# Patient Record
Sex: Male | Born: 1995 | Race: Black or African American | Hispanic: No | Marital: Single | Smoking: Former smoker
Health system: Southern US, Community
[De-identification: ages and names within clinical notes are randomized; demographics above are authoritative.]

## PROBLEM LIST (undated history)

## (undated) DIAGNOSIS — Z5111 Encounter for antineoplastic chemotherapy: Secondary | ICD-10-CM

## (undated) DIAGNOSIS — C8110 Nodular sclerosis classical Hodgkin lymphoma, unspecified site: Secondary | ICD-10-CM

## (undated) DIAGNOSIS — K219 Gastro-esophageal reflux disease without esophagitis: Secondary | ICD-10-CM

## (undated) DIAGNOSIS — J189 Pneumonia, unspecified organism: Secondary | ICD-10-CM

## (undated) DIAGNOSIS — R519 Headache, unspecified: Secondary | ICD-10-CM

## (undated) DIAGNOSIS — C8112 Nodular sclerosis classical Hodgkin lymphoma, intrathoracic lymph nodes: Secondary | ICD-10-CM

## (undated) DIAGNOSIS — D701 Agranulocytosis secondary to cancer chemotherapy: Principal | ICD-10-CM

## (undated) DIAGNOSIS — F909 Attention-deficit hyperactivity disorder, unspecified type: Secondary | ICD-10-CM

## (undated) DIAGNOSIS — D649 Anemia, unspecified: Secondary | ICD-10-CM

## (undated) DIAGNOSIS — R51 Headache: Secondary | ICD-10-CM

## (undated) DIAGNOSIS — T451X5A Adverse effect of antineoplastic and immunosuppressive drugs, initial encounter: Principal | ICD-10-CM

## (undated) HISTORY — DX: Pneumonia, unspecified organism: J18.9

## (undated) HISTORY — DX: Nodular sclerosis Hodgkin lymphoma, intrathoracic lymph nodes: C81.12

## (undated) HISTORY — DX: Encounter for antineoplastic chemotherapy: Z51.11

## (undated) HISTORY — DX: Agranulocytosis secondary to cancer chemotherapy: D70.1

## (undated) HISTORY — DX: Adverse effect of antineoplastic and immunosuppressive drugs, initial encounter: T45.1X5A

---
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
## 2013-10-30 ENCOUNTER — Emergency Department (INDEPENDENT_AMBULATORY_CARE_PROVIDER_SITE_OTHER)
Admission: EM | Admit: 2013-10-30 | Discharge: 2013-10-30 | Disposition: A | Discharge status: Home / Self Care | Payer: Medicaid Other | Source: Home / Self Care | Attending: Family Medicine | Admitting: Family Medicine

## 2013-10-30 ENCOUNTER — Encounter (HOSPITAL_COMMUNITY): Payer: Self-pay | Admitting: Emergency Medicine

## 2013-10-30 DIAGNOSIS — R05 Cough: Secondary | ICD-10-CM

## 2013-10-30 DIAGNOSIS — R0981 Nasal congestion: Secondary | ICD-10-CM

## 2013-10-30 DIAGNOSIS — R059 Cough, unspecified: Secondary | ICD-10-CM

## 2013-10-30 HISTORY — DX: Attention-deficit hyperactivity disorder, unspecified type: F90.9

## 2013-10-30 MED ORDER — IPRATROPIUM BROMIDE 0.06 % NA SOLN
2.0000 | Freq: Four times a day (QID) | NASAL | Status: DC
Start: 2013-10-30 — End: 2013-12-08

## 2013-10-30 MED ORDER — BENZONATATE 100 MG PO CAPS: 100.0000 mg | ORAL_CAPSULE | Freq: Three times a day (TID) | ORAL | Status: DC | PRN

## 2013-10-30 NOTE — Discharge Instructions (Signed)
Cough, Adult  A cough is a reflex that helps clear your throat and airways. It can help heal the body or may be a reaction to an irritated airway. A cough may only last 2 or 3 weeks (acute) or may last more than 8 weeks (chronic).  CAUSES Acute cough:  Viral or bacterial infections. Chronic cough:  Infections.  Allergies.  Asthma.  Post-nasal drip.  Smoking.  Heartburn or acid reflux.  Some medicines.  Chronic lung problems (COPD).  Cancer. SYMPTOMS   Cough.  Fever.  Chest pain.  Increased breathing rate.  High-pitched whistling sound when breathing (wheezing).  Colored mucus that you cough up (sputum). TREATMENT   A bacterial cough may be treated with antibiotic medicine.  A viral cough must run its course and will not respond to antibiotics.  Your caregiver may recommend other treatments if you have a chronic cough. HOME CARE INSTRUCTIONS   Only take over-the-counter or prescription medicines for pain, discomfort, or fever as directed by your caregiver. Use cough suppressants only as directed by your caregiver.  Use a cold steam vaporizer or humidifier in your bedroom or home to help loosen secretions.  Sleep in a semi-upright position if your cough is worse at night.  Rest as needed.  Stop smoking if you smoke. SEEK IMMEDIATE MEDICAL CARE IF:   You have pus in your sputum.  Your cough starts to worsen.  You cannot control your cough with suppressants and are losing sleep.  You begin coughing up blood.  You have difficulty breathing.  You develop pain which is getting worse or is uncontrolled with medicine.  You have a fever. MAKE SURE YOU:   Understand these instructions.  Will watch your condition.  Will get help right away if you are not doing well or get worse. Document Released: 06/17/2010 Document Revised: 03/13/2011 Document Reviewed: 06/17/2010 Exit[employer] Patient Information 2015 [employer]. This information is not intended  to replace advice given to you by your health care provider. Make sure you discuss any questions you have with your health care provider.  

## 2013-10-30 NOTE — ED Notes (Signed)
C/o productive cough with green sputum.  Denies chest pain and sob.  Also c/o not being able to sleep.  Denies fever.  No relief with tylenol symptoms present x 2 wks.

## 2013-10-30 NOTE — ED Provider Notes (Signed)
CSN: [id number]     Arrival date & time 10/30/13  1537 History   First MD Initiated Contact with Patient 10/30/13 1623     Chief Complaint  Patient presents with  . Cough  . Insomnia   (Consider location/radiation/quality/duration/timing/severity/associated sxs/prior Treatment) HPI Comments: PCP: [facility] [place] [facility]  Patient is a 18 y.o. male presenting with cough. The history is provided by the patient.  Cough Cough characteristics: occasionally productive. Severity:  Mild Onset quality:  Gradual Duration: several days. Timing:  Intermittent Progression:  Waxing and waning (seems to be worse at night) Chronicity:  New Smoker: no   Ineffective treatments: tylenol and Goody's  Associated symptoms: headaches and rhinorrhea   Associated symptoms: no chest pain, no chills, no diaphoresis, no ear pain, no fever, no myalgias, no rash, no shortness of breath, no sinus congestion, no sore throat, no weight loss and no wheezing   Associated symptoms comment:  +nasal congestion   Past Medical History  Diagnosis Date  . Adult ADHD    History reviewed. No pertinent past surgical history. History reviewed. No pertinent family history. History  Substance Use Topics  . Smoking status: Never Smoker   . Smokeless tobacco: Not on file  . Alcohol Use: No    Review of Systems  Constitutional: Negative for fever, chills, weight loss and diaphoresis.  HENT: Positive for rhinorrhea. Negative for ear pain and sore throat.   Respiratory: Positive for cough. Negative for shortness of breath and wheezing.   Cardiovascular: Negative for chest pain.  Gastrointestinal: Negative.   Genitourinary: Negative.   Musculoskeletal: Negative for myalgias.  Skin: Negative for rash.  Neurological: Positive for headaches.    Allergies  Bactrim  Home Medications   Prior to Admission medications   Medication Sig Start Date End Date Taking? Authorizing Provider  fluticasone  (FLONASE) 50 MCG/ACT nasal spray Place 2 sprays into the nose daily. 12/21/10 12/21/11  Earlie Counts, MD  ibuprofen (ADVIL,MOTRIN) 600 MG tablet Take 1 tablet (600 mg total) by mouth every 6 (six) hours as needed for pain. 05/22/12   Rosana Hoes, MD   BP 131/91  Pulse 82  Temp(Src) 99.2 F (37.3 C) (Oral)  Resp 14  SpO2 98% Physical Exam  Nursing note and vitals reviewed. Constitutional: He is oriented to person, place, and time. He appears well-developed and well-nourished. No distress.  HENT:  Head: Normocephalic and atraumatic.  Right Ear: Hearing, tympanic membrane, external ear and ear canal normal.  Left Ear: Hearing, tympanic membrane, external ear and ear canal normal.  Nose: Mucosal edema and rhinorrhea present.  Mouth/Throat: Uvula is midline, oropharynx is clear and moist and mucous membranes are normal. No oral lesions. No trismus in the jaw. Normal dentition. No uvula swelling.  +small amount of clear fluid behind both TMs  Eyes: Conjunctivae are normal. No scleral icterus.  Cardiovascular: Normal rate, regular rhythm and normal heart sounds.   Pulmonary/Chest: Effort normal and breath sounds normal. No stridor. No respiratory distress. He has no wheezes.  Musculoskeletal: Normal range of motion.  Neurological: He is alert and oriented to person, place, and time.  Skin: Skin is warm and dry.  Psychiatric: He has a normal mood and affect. His behavior is normal.    ED Course  Procedures (including critical care time) Labs Review Labs Reviewed - No data to display  Imaging Review No results found.   MDM   1. Cough   2. Nasal congestion   Atrovent for nasal congestion  Tessalon for cough Expect resolution over next few days If no improvement, advised PCP follow up.     Lutricia Feil, [place] 10/30/13 [phone]

## 2013-11-15 ENCOUNTER — Encounter (HOSPITAL_COMMUNITY): Payer: Self-pay | Admitting: Emergency Medicine

## 2013-11-15 ENCOUNTER — Emergency Department (INDEPENDENT_AMBULATORY_CARE_PROVIDER_SITE_OTHER)
Admission: EM | Admit: 2013-11-15 | Discharge: 2013-11-15 | Disposition: A | Discharge status: Home / Self Care | Payer: Medicaid Other | Source: Home / Self Care | Attending: Emergency Medicine | Admitting: Emergency Medicine

## 2013-11-15 ENCOUNTER — Other Ambulatory Visit (HOSPITAL_COMMUNITY)
Admission: RE | Admit: 2013-11-15 | Discharge: 2013-11-15 | Disposition: A | Discharge status: Home / Self Care | Payer: Medicaid Other | Source: Ambulatory Visit | Attending: Emergency Medicine | Admitting: Emergency Medicine

## 2013-11-15 DIAGNOSIS — Z202 Contact with and (suspected) exposure to infections with a predominantly sexual mode of transmission: Secondary | ICD-10-CM

## 2013-11-15 DIAGNOSIS — Z113 Encounter for screening for infections with a predominantly sexual mode of transmission: Secondary | ICD-10-CM | POA: Diagnosis present

## 2013-11-15 LAB — RPR

## 2013-11-15 LAB — HIV ANTIBODY (ROUTINE TESTING W REFLEX): HIV: NONREACTIVE

## 2013-11-15 MED ORDER — AZITHROMYCIN 250 MG PO TABS
1000.0000 mg | ORAL_TABLET | Freq: Once | ORAL | Status: AC
  Administered 2013-11-15: 1000 mg via ORAL

## 2013-11-15 MED ORDER — AZITHROMYCIN 250 MG PO TABS
ORAL_TABLET | ORAL | Status: AC
  Filled 2013-11-15: qty 4

## 2013-11-15 MED ORDER — LIDOCAINE HCL (PF) 1 % IJ SOLN
INTRAMUSCULAR | Status: AC
  Filled 2013-11-15: qty 5

## 2013-11-15 MED ORDER — CEFTRIAXONE SODIUM 250 MG IJ SOLR
INTRAMUSCULAR | Status: AC
  Filled 2013-11-15: qty 250

## 2013-11-15 MED ORDER — CEFTRIAXONE SODIUM 250 MG IJ SOLR
250.0000 mg | Freq: Once | INTRAMUSCULAR | Status: AC
  Administered 2013-11-15: 250 mg via INTRAMUSCULAR

## 2013-11-15 NOTE — ED Notes (Signed)
Concerned for std, denies any symptoms.  "just like to be safe"

## 2013-11-15 NOTE — ED Notes (Signed)
Patient has been offered liquids, given ice and sprite.  Patient aware of post injection delay prior to being discharged

## 2013-11-15 NOTE — ED Provider Notes (Signed)
  Chief Complaint   No chief complaint on file.   History of Present Illness   Jeffery Crane is a 18 year old male who had unprotected intercourse last night and is concerned about the possibility of STDs. He himself is asymptomatic. He's had no urethral discharge, burning with urination, or penile pain. He denies any penile lesions, adenopathy, or testicular pain. He has had no abdominal pain, nausea, vomiting, fever, chills, joint pain, or skin rash. His partner has had no symptoms as far as he knows. He has no prior history of STDs.  Review of Systems   Other than as noted above, the patient denies any of the following symptoms: Systemic:  No fevers chills, arthralgias, or adenopathy. GI:  No abdominal pain, nausea or vomiting. GU:  No dysuria, penile pain, discharge, itching, dysuria, genital lesions, testicular pain or swelling. Skin:  No rash or itching.  [place]   Past medical history, family history, social history, meds, and allergies were reviewed.   Physical Examination    Vital signs:  BP 115/80 mmHg  Pulse 85  Temp(Src) 98.7 F (37.1 C) (Oral)  Resp 20  SpO2 100% Gen:  Alert, oriented, in no distress. ENT:  Pharynx clear, no oral lesions.  Abdomen:  Soft and flat, non-distended, and non-tender.  No organomegaly or mass. Genital:  Normal examination. No urethral discharge, no penile lesions, no testicular pain, swelling, or mass, no inguinal adenopathy or hernia. Skin:  Warm and dry.  No rash.   Labs   Results for orders placed or performed during the hospital encounter of 12/21/10  POCT urinalysis dip (device)  Result Value Ref Range   Glucose, UA NEGATIVE NEGATIVE mg/dL   Bilirubin Urine SMALL (A) NEGATIVE   Ketones, ur NEGATIVE NEGATIVE mg/dL   Specific Gravity, Urine 1.025 1.005 - 1.030   Hgb urine dipstick NEGATIVE NEGATIVE   pH 6.5 5.0 - 8.0   Protein, ur 30 (A) NEGATIVE mg/dL   Urobilinogen, UA 2.0 (H) 0.0 - 1.0 mg/dL   Nitrite NEGATIVE NEGATIVE   Leukocytes, UA NEGATIVE NEGATIVE    DNA probes for gonorrhea, chlamydia, and serologies for HIV and syphilis were obtained.  Course in Urgent [place]   The following medications were given:  Medications  cefTRIAXone (ROCEPHIN) injection 250 mg   azithromycin (ZITHROMAX) tablet 1,000 mg    Assessment   The encounter diagnosis was Potential exposure to STD.  Plan    1.  Meds:  The following meds were prescribed:   New Prescriptions   No medications on file    2.  Patient Education/Counseling:  The patient was given appropriate handouts, self care instructions, and instructed in symptomatic relief.The patient was instructed to inform all sexual contacts, avoid intercourse completely for 2 weeks and then only with a condom.  The patient was told that we would call about all abnormal lab results, and that we would need to report certain kinds of infection to the health department.    3.  Follow up:  The patient was told to follow up here if no better in 3 to 4 days, or sooner if becoming worse in any way, and given some red flag symptoms such as fever, pain, or difficulty urinating which would prompt immediate return.  Suggested repeat HIV and syphilis testing in 3 and 6 months at the health department.     Harden Mo, MD 11/15/13 1420

## 2013-11-15 NOTE — Discharge Instructions (Signed)
You have been diagnosed with a possible STD.  Your results should be back in  1 - 3 days.  We will call you with the results of any positive tests, so if you don't hear from [place], you can assume your results are all negative.  If you wish, you can call us here at [phone] and ask for a nurse to give you the results.  They can give you the results of all your tests over the phone.  If your HIV should come back positive, we must give you this result in person to protect your confidentiality.  We can give you a negative HIV result over the phone.    In the meantime, you should avoid intercourse altogether for 1 week.  After that, you should always use condoms--100% of the time.  This will not only prevent pregnancy, but has been shown to prevent HIV, syphilis, gonorrhea, chlamydia, hepatis C and other STDs.  If your test comes back positive, we are required by law to report it to the Health Department.  We also suggest you inform your partner or partners so they can get tested and treated as well.  You can get STD testing for free at the [facility] Department.  It is recommended that you have repeat testing for HIV and syphilis in 3 and 6 months, since it can take a while for these tests to become positive.

## 2013-11-16 NOTE — Progress Notes (Signed)
Quick Note:  Test result was normal. No further action is needed at this time. ______ 

## 2013-11-17 LAB — URINE CYTOLOGY ANCILLARY ONLY
CHLAMYDIA, DNA PROBE: NEGATIVE
NEISSERIA GONORRHEA: NEGATIVE
Trichomonas: NEGATIVE

## 2013-12-08 ENCOUNTER — Emergency Department (INDEPENDENT_AMBULATORY_CARE_PROVIDER_SITE_OTHER)
Admission: EM | Admit: 2013-12-08 | Discharge: 2013-12-08 | Disposition: A | Discharge status: Home / Self Care | Payer: Medicaid Other | Source: Home / Self Care | Attending: Family Medicine | Admitting: Family Medicine

## 2013-12-08 ENCOUNTER — Encounter (HOSPITAL_COMMUNITY): Payer: Self-pay | Admitting: Emergency Medicine

## 2013-12-08 DIAGNOSIS — R0789 Other chest pain: Secondary | ICD-10-CM

## 2013-12-08 DIAGNOSIS — I10 Essential (primary) hypertension: Secondary | ICD-10-CM

## 2013-12-08 NOTE — ED Notes (Signed)
Pt states he has been having chest pain over the last three months that only occurs when he drinks alcohol.  His last episode was about a month ago, when he stopped drinking.  Pt states the pain will be a throbbing in his right chest that radiates into his arm with numbness and tingling.  Pt states that the pain will be a 10/10 and would last about 2 hours.

## 2013-12-08 NOTE — ED Provider Notes (Addendum)
CSN: [id number]     Arrival date & time 12/08/13  1525 History   First MD Initiated Contact with Patient 12/08/13 1545     No chief complaint on file.  (Consider location/radiation/quality/duration/timing/severity/associated sxs/prior Treatment) HPI  Chest pain: ongonig for 3 months. Comes on w/ ETOH ingestion (even one sip). Takes a coupld minutes to start. Lasts for 2 hours. Throbbing sensation in mid-R chest area. Radiation to R arm. Unable to get the pain to stop. Does not worsen w/ exertion. Denies caffeine use. Denies syncope, nausea, palpitations, diaphoresis.   Occasional slight tightness w/ deep inspiration - not pain.    Past Medical History  Diagnosis Date  . Adult ADHD    History reviewed. No pertinent past surgical history. History reviewed. No pertinent family history. History  Substance Use Topics  . Smoking status: Never Smoker   . Smokeless tobacco: Not on file  . Alcohol Use: Yes     Comment: occasional    Review of Systems Per HPI with all other pertinent systems negative.   Allergies  Bactrim  Home Medications   Prior to Admission medications   Medication Sig Start Date End Date Taking? Authorizing Provider  fluticasone (FLONASE) 50 MCG/ACT nasal spray Place 2 sprays into the nose daily. 12/21/10 12/21/11  Earlie Counts, MD   BP 130/103 mmHg  Pulse 88  Temp(Src) 98.1 F (36.7 C) (Oral)  Resp 16  SpO2 97% Physical Exam  Constitutional: He is oriented to person, place, and time. He appears well-developed and well-nourished. No distress.  HENT:  Head: Normocephalic and atraumatic.  Eyes: Pupils are equal, round, and reactive to light.  Neck: Normal range of motion. Neck supple.  Cardiovascular: Normal rate and normal heart sounds.   No murmur heard. Pulmonary/Chest: Effort normal. No respiratory distress.  Abdominal: Soft. Bowel sounds are normal.  Musculoskeletal: Normal range of motion. He exhibits no tenderness.  Neurological: He is alert and  oriented to person, place, and time.  Skin: Skin is warm and dry. No rash noted. He is not diaphoretic. No erythema.  Psychiatric: He has a normal mood and affect. His behavior is normal. Thought content normal.    ED Course  Procedures (including critical care time) Labs Review Labs Reviewed - No data to display  Imaging Review No results found.   EKG NSR, no sign of ischemia. R axis deviation  MDM   1. Atypical chest pain   2. Essential hypertension    HTN: monitor at home and if remains elevated, make appt w/ PCP to discuss starting medications. Goal BP given. Repeat pressure 130/70.   CP: Atypical. - non-cardiac. ??/ gallbladder but not likely. Counseled pt to stop drinking as underage PPI  Seek emergency evaluation if comes on again w/ ETOH use or at other time.   Precautions given and all questions answered  Linna Darner, MD Family Medicine 12/08/2013, 4:47 PM       Waldemar Dickens, MD 12/08/13 [place], MD 12/08/13 [phone]

## 2013-12-08 NOTE — ED Notes (Signed)
RN, notified of BP.

## 2013-12-08 NOTE — Discharge Instructions (Signed)
The cause of your chest pain is not clear other than the fact that it is related to alcohol consumption.  Please avoid alcohol for the time being Your heart exam today was normal There is no immediate sign of a heart attack Please consider starting on prilosec once a day for 2-4 weeks to see if it stops your symptoms If you drink and the pain starts, please go directly to the emergency room.  Please check your blood pressure regularly and if it stays high (higher than 139/89)then go to your regular doctor for blood pressure medication.

## 2013-12-18 ENCOUNTER — Emergency Department: Payer: Self-pay | Admitting: Emergency Medicine

## 2013-12-18 LAB — CBC
HCT: 45.9 % (ref 40.0–52.0)
HGB: 15.1 g/dL (ref 13.0–18.0)
MCH: 29.5 pg (ref 26.0–34.0)
MCHC: 32.9 g/dL (ref 32.0–36.0)
MCV: 90 fL (ref 80–100)
Platelet: 312 10*3/uL (ref 150–440)
RBC: 5.12 10*6/uL (ref 4.40–5.90)
RDW: 13.2 % (ref 11.5–14.5)
WBC: 8.1 10*3/uL (ref 3.8–10.6)

## 2013-12-18 LAB — BASIC METABOLIC PANEL
ANION GAP: 5 — AB (ref 7–16)
BUN: 7 mg/dL (ref 7–18)
CHLORIDE: 105 mmol/L (ref 98–107)
CREATININE: 0.93 mg/dL (ref 0.60–1.30)
Calcium, Total: 9 mg/dL (ref 8.5–10.1)
Co2: 28 mmol/L (ref 21–32)
EGFR (African American): >60
Glucose: 120 mg/dL — ABNORMAL HIGH (ref 65–99)
OSMOLALITY: 275 (ref 275–301)
Potassium: 4 mmol/L (ref 3.5–5.1)
Sodium: 138 mmol/L (ref 136–145)

## 2013-12-18 LAB — TROPONIN I

## 2014-07-11 ENCOUNTER — Encounter (HOSPITAL_COMMUNITY): Payer: Self-pay

## 2014-07-11 ENCOUNTER — Emergency Department (HOSPITAL_COMMUNITY): Payer: Medicaid Other

## 2014-07-11 ENCOUNTER — Emergency Department (HOSPITAL_COMMUNITY)
Admission: EM | Admit: 2014-07-11 | Discharge: 2014-07-11 | Disposition: A | Discharge status: Home / Self Care | Payer: Medicaid Other | Attending: Emergency Medicine | Admitting: Emergency Medicine

## 2014-07-11 DIAGNOSIS — M940 Chondrocostal junction syndrome [Tietze]: Secondary | ICD-10-CM | POA: Insufficient documentation

## 2014-07-11 DIAGNOSIS — R12 Heartburn: Secondary | ICD-10-CM | POA: Insufficient documentation

## 2014-07-11 DIAGNOSIS — Z7951 Long term (current) use of inhaled steroids: Secondary | ICD-10-CM | POA: Diagnosis not present

## 2014-07-11 DIAGNOSIS — R079 Chest pain, unspecified: Secondary | ICD-10-CM | POA: Diagnosis present

## 2014-07-11 DIAGNOSIS — Z8659 Personal history of other mental and behavioral disorders: Secondary | ICD-10-CM | POA: Insufficient documentation

## 2014-07-11 LAB — BASIC METABOLIC PANEL
Anion gap: 9 (ref 5–15)
BUN: 11 mg/dL (ref 6–20)
CALCIUM: 9.1 mg/dL (ref 8.9–10.3)
CO2: 26 mmol/L (ref 22–32)
Chloride: 103 mmol/L (ref 101–111)
Creatinine, Ser: 0.83 mg/dL (ref 0.61–1.24)
GFR calc Af Amer: >60 mL/min (ref >60)
GFR calc non Af Amer: >60 mL/min (ref >60)
Glucose, Bld: 95 mg/dL (ref 65–99)
POTASSIUM: 3.8 mmol/L (ref 3.5–5.1)
Sodium: 138 mmol/L (ref 135–145)

## 2014-07-11 LAB — CBC
HCT: 41.3 % (ref 39.0–52.0)
Hemoglobin: 14.2 g/dL (ref 13.0–17.0)
MCH: 29 pg (ref 26.0–34.0)
MCHC: 34.4 g/dL (ref 30.0–36.0)
MCV: 84.3 fL (ref 78.0–100.0)
Platelets: 266 10*3/uL (ref 150–400)
RBC: 4.9 MIL/uL (ref 4.22–5.81)
RDW: 13.2 % (ref 11.5–15.5)
WBC: 9.8 10*3/uL (ref 4.0–10.5)

## 2014-07-11 LAB — LIPASE, BLOOD: LIPASE: 20 U/L — AB (ref 22–51)

## 2014-07-11 LAB — I-STAT TROPONIN, ED: Troponin i, poc: 0 ng/mL (ref 0.00–0.08)

## 2014-07-11 LAB — HEPATIC FUNCTION PANEL
ALT: 18 U/L (ref 17–63)
AST: 21 U/L (ref 15–41)
Albumin: 3.7 g/dL (ref 3.5–5.0)
Alkaline Phosphatase: 108 U/L (ref 38–126)
TOTAL PROTEIN: 7.4 g/dL (ref 6.5–8.1)
Total Bilirubin: 0.4 mg/dL (ref 0.3–1.2)

## 2014-07-11 MED ORDER — CYCLOBENZAPRINE HCL 10 MG PO TABS: 5.0000 mg | ORAL_TABLET | Freq: Two times a day (BID) | ORAL | Status: DC | PRN

## 2014-07-11 MED ORDER — OMEPRAZOLE 20 MG PO CPDR: 20.0000 mg | DELAYED_RELEASE_CAPSULE | Freq: Every day | ORAL | Status: DC

## 2014-07-11 MED ORDER — GI COCKTAIL ~~LOC~~
30.0000 mL | Freq: Once | ORAL | Status: AC
  Administered 2014-07-11: 30 mL via ORAL
  Filled 2014-07-11: qty 30

## 2014-07-11 NOTE — ED Notes (Signed)
Pt reports onset 2 years of right chest pain, worse in past month.  Stretching makes worse and drinking alcohol causes pain to radiate to right shoulder and down right arm.  Pt takes Ibuprofen with relief only sometimes.  Pain lasts 1 hour usually and sometimes he has shortness of breath.  NAD at this time.

## 2014-07-11 NOTE — Discharge Instructions (Signed)
Costochondritis Costochondritis, sometimes called Tietze syndrome, is a swelling and irritation (inflammation) of the tissue (cartilage) that connects your ribs with your breastbone (sternum). It causes pain in the chest and rib area. Costochondritis usually goes away on its own over time. It can take up to 6 weeks or longer to get better, especially if you are unable to limit your activities. CAUSES  Some cases of costochondritis have no known cause. Possible causes include:  Injury (trauma).  Exercise or activity such as lifting.  Severe coughing. SIGNS AND SYMPTOMS  Pain and tenderness in the chest and rib area.  Pain that gets worse when coughing or taking deep breaths.  Pain that gets worse with specific movements. DIAGNOSIS  Your health care provider will do a physical exam and ask about your symptoms. Chest X-rays or other tests may be done to rule out other problems. TREATMENT  Costochondritis usually goes away on its own over time. Your health care provider may prescribe medicine to help relieve pain. HOME CARE INSTRUCTIONS   Avoid exhausting physical activity. Try not to strain your ribs during normal activity. This would include any activities using chest, abdominal, and side muscles, especially if heavy weights are used.  Apply ice to the affected area for the first 2 days after the pain begins.  Put ice in a plastic bag.  Place a towel between your skin and the bag.  Leave the ice on for 20 minutes, 2-3 times a day.  Only take over-the-counter or prescription medicines as directed by your health care provider. SEEK MEDICAL CARE IF:  You have redness or swelling at the rib joints. These are signs of infection.  Your pain does not go away despite rest or medicine. SEEK IMMEDIATE MEDICAL CARE IF:   Your pain increases or you are very uncomfortable.  You have shortness of breath or difficulty breathing.  You cough up blood.  You have worse chest pains,  sweating, or vomiting.  You have a fever or persistent symptoms for more than 2-3 days.  You have a fever and your symptoms suddenly get worse. MAKE SURE YOU:   Understand these instructions.  Will watch your condition.  Will get help right away if you are not doing well or get worse. Document Released: 09/28/2004 Document Revised: 10/09/2012 Document Reviewed: 07/23/2012 Exit[employer] Patient Information 2015 [employer]. This information is not intended to replace advice given to you by your health care provider. Make sure you discuss any questions you have with your health care provider.  

## 2014-07-11 NOTE — ED Provider Notes (Signed)
CSN: [id number]     Arrival date & time 07/11/14  1951 History   First MD Initiated Contact with Patient 07/11/14 2040     Chief Complaint  Patient presents with  . Chest Pain     (Consider location/radiation/quality/duration/timing/severity/associated sxs/prior Treatment) Patient is a 19 y.o. male presenting with chest pain.  Chest Pain Pain location:  R chest Pain quality: dull and shooting   Pain radiates to:  R arm Pain radiates to the back: no   Pain severity:  No pain (patient does not currently have pain but when he does have pain it is moderate) Onset quality:  Unable to specify Duration:  15 minutes Timing:  Sporadic Progression:  Waxing and waning Chronicity:  Recurrent Context: eating (drinking alcohol), lifting, movement and raising an arm   Context: not breathing, no drug use, no intercourse, not at rest, no stress and no trauma   Relieved by: Ibuprofen helped at first but it now makes the pain worse. Associated symptoms: heartburn   Associated symptoms: no abdominal pain, no anxiety, no back pain, no cough, no diaphoresis, no dysphagia, no fatigue, no nausea, no near-syncope, no numbness, no palpitations and no shortness of breath      PCP: [facility] Blood pressure 129/88, pulse 73, temperature 98.3 F (36.8 C), temperature source Oral, resp. rate 21, weight 210 lb (95.255 kg), SpO2 99 %.  Jeffery Crane is a 19 y.o.male with a significant PMH of ADHD presents to the ER with complaints of right sided chest pain for two years worse over the past month. It is worse with drinking alcohol and stretching. He sometimes gets pain down into his shoulder.      The patient denies diaphoresis, fever, headache, weakness (general or focal), confusion, change of vision,  neck pain, dysphagia, aphagia, chest pain, shortness of breath,  back pain, abdominal pains, nausea, vomiting, diarrhea, lower extremity swelling, rash.    Past Medical History    Diagnosis Date  . Adult ADHD    History reviewed. No pertinent past surgical history. History reviewed. No pertinent family history. History  Substance Use Topics  . Smoking status: Never Smoker   . Smokeless tobacco: Not on file  . Alcohol Use: Yes     Comment: rare    Review of Systems  Constitutional: Negative for diaphoresis and fatigue.  HENT: Negative for trouble swallowing.   Respiratory: Negative for cough and shortness of breath.   Cardiovascular: Positive for chest pain. Negative for palpitations and near-syncope.  Gastrointestinal: Positive for heartburn. Negative for nausea and abdominal pain.  Musculoskeletal: Negative for back pain.  Neurological: Negative for numbness.   10 Systems reviewed and are negative for acute change except as noted in the HPI.      Allergies  Bactrim  Home Medications   Prior to Admission medications   Medication Sig Start Date End Date Taking? Authorizing Provider  ibuprofen (ADVIL,MOTRIN) 200 MG tablet Take 600 mg by mouth every 6 (six) hours as needed for moderate pain.   Yes Historical Provider, MD  naproxen sodium (ANAPROX) 220 MG tablet Take 220 mg by mouth 2 (two) times daily as needed (for pain).   Yes Historical Provider, MD  cyclobenzaprine (FLEXERIL) 10 MG tablet Take 0.5-1 tablets (5-10 mg total) by mouth 2 (two) times daily as needed for muscle spasms. 07/11/14   Jeffery Gilkey Carlota Raspberry, PA-C  fluticasone (FLONASE) 50 MCG/ACT nasal spray Place 2 sprays into the nose daily. 12/21/10 12/21/11  Jeffery Counts,  MD  omeprazole (PRILOSEC) 20 MG capsule Take 1 capsule (20 mg total) by mouth daily. 07/11/14   Jeffery Mallo Carlota Raspberry, PA-C   BP 129/88 mmHg  Pulse 73  Temp(Src) 98.3 F (36.8 C) (Oral)  Resp 21  Wt 210 lb (95.255 kg)  SpO2 99% Physical Exam  Constitutional: He appears well-developed and well-nourished. No distress.  HENT:  Head: Normocephalic and atraumatic.  Eyes: Pupils are equal, round, and reactive to light.  Neck: Normal  range of motion. Neck supple.  Cardiovascular: Normal rate and regular rhythm.   Pulmonary/Chest: Effort normal and breath sounds normal. Chest wall is not dull to percussion. He exhibits tenderness and bony tenderness. He exhibits no mass, no laceration, no crepitus, no edema and no retraction.    Abdominal: Soft. Bowel sounds are normal. He exhibits no shifting dullness and no distension. There is no hepatosplenomegaly. There is no tenderness. There is no rigidity, no rebound, no guarding and no CVA tenderness.  Musculoskeletal:  No lower extremity swelling  Neurological: He is alert.  Skin: Skin is warm and dry.  Nursing note and vitals reviewed.   ED Course  Procedures (including critical care time) Labs Review Labs Reviewed  HEPATIC FUNCTION PANEL - Abnormal; Notable for the following:    Bilirubin, Direct <0.1 (*)    All other components within normal limits  LIPASE, BLOOD - Abnormal; Notable for the following:    Lipase 20 (*)    All other components within normal limits  CBC  BASIC METABOLIC PANEL  I-STAT TROPOININ, ED    Imaging Review No results found.   EKG Interpretation   Date/Time:  Saturday July 11 2014 19:58:04 EDT Ventricular Rate:  78 PR Interval:  162 QRS Duration: 92 QT Interval:  356 QTC Calculation: 405 R Axis:   100 Text Interpretation:  Normal sinus rhythm Rightward axis Borderline ECG ED  PHYSICIAN INTERPRETATION AVAILABLE IN [facility] Confirmed by TEST,  Record ([id number]) on 07/12/2014 8:00:13 AM      MDM   Final diagnoses:  Costochondritis   Negative Troponin, CBC, CMP and lipase are all WNL. Negative chest xray. HEART SCORE of 0.  PERC negative   omeprazole (PRILOSEC) 20 MG capsule Take 1 capsule (20 mg total) by mouth daily. 30 capsule Delos Haring, PA-C   Medications  gi cocktail (Maalox,Lidocaine,Donnatal) (30 mLs Oral Given 07/11/14 2206)   18 y.o.Jeffery Crane's evaluation in the Emergency Department is complete. It has  been determined that no acute conditions requiring further emergency intervention are present at this time. The patient/guardian have been advised of the diagnosis and plan. We have discussed signs and symptoms that warrant return to the ED, such as changes or worsening in symptoms.  Vital signs are stable at discharge. Filed Vitals:   07/11/14 2207  BP: 129/88  Pulse: 73  Temp:   Resp: 21    Patient/guardian has voiced understanding and agreed to follow-up with the PCP or specialist.     Delos Haring, PA-C 07/16/14 1600  Lacretia Leigh, MD 07/18/14 [phone]

## 2014-09-21 ENCOUNTER — Encounter (HOSPITAL_COMMUNITY): Payer: Self-pay | Admitting: Emergency Medicine

## 2014-09-21 ENCOUNTER — Emergency Department (INDEPENDENT_AMBULATORY_CARE_PROVIDER_SITE_OTHER)
Admission: EM | Admit: 2014-09-21 | Discharge: 2014-09-21 | Disposition: A | Discharge status: Home / Self Care | Payer: Self-pay | Source: Home / Self Care | Attending: Family Medicine | Admitting: Family Medicine

## 2014-09-21 ENCOUNTER — Emergency Department (HOSPITAL_COMMUNITY)
Admission: EM | Admit: 2014-09-21 | Discharge: 2014-09-21 | Disposition: A | Discharge status: Home / Self Care | Payer: Medicaid Other

## 2014-09-21 DIAGNOSIS — R0789 Other chest pain: Secondary | ICD-10-CM

## 2014-09-21 MED ORDER — MELOXICAM 15 MG PO TABS: 7.5000 mg | ORAL_TABLET | Freq: Every day | ORAL | Status: DC

## 2014-09-21 MED ORDER — GABAPENTIN 100 MG PO CAPS: 100.0000 mg | ORAL_CAPSULE | Freq: Three times a day (TID) | ORAL | Status: DC

## 2014-09-21 NOTE — ED Notes (Signed)
C/o right chest soreness States he used to lift weights a couple of months ago [place] used as tx

## 2014-09-21 NOTE — Discharge Instructions (Signed)
The cause of your chest pain is not immediately clear but is likely due to either costochondritis or muscle strain in your pectoralis muscle. Please start the meloxicam. If this does not improve your symptoms you can discontinue it and then start the gabapentin. This needs to be started at a 100 mg 3 times a day dose over the course of the week and then increase to 300 mg 3 times a day. If this still does not provide relief go back to 100 mg 3 times a day and then stop. Please call our social worker to discuss insurance coverage and seek follow-up through primary care office or at the sports medicine clinic as they can order ultrasound or MRI or physical therapy which she may likely need.

## 2014-09-21 NOTE — ED Provider Notes (Signed)
CSN: [id number]     Arrival date & time 09/21/14  1940 History   First MD Initiated Contact with Patient 09/21/14 2041     Chief Complaint  Patient presents with  . Chest Pain   (Consider location/radiation/quality/duration/timing/severity/associated sxs/prior Treatment) HPI  Chest pain, ongoing for a year. Right upper chest. Comes and goes. Patient has been seen multiple times in the urgent care and emergency room for this. Has had extensive workup especially in the emergency room for this and his cardiac etiology was ruled out. Patient states that is worse with certain movements and deep breathing. Is nonexertional. Denies any shortness of breath, nausea, vomiting, diaphoresis, radiation to the neck or shoulder area did patient states that he stopped working out 3 months ago but did not see any change in his symptoms. Patient states when he is placed on a PPI he feels like his pain probably got a little worse. Used Flexeril in the past without benefit.  Past Medical History  Diagnosis Date  . Adult ADHD    History reviewed. No pertinent past surgical history. History reviewed. No pertinent family history. Social History  Substance Use Topics  . Smoking status: Never Smoker   . Smokeless tobacco: None  . Alcohol Use: Yes     Comment: rare    Review of Systems Per HPI with all other pertinent systems negative.   Allergies  Bactrim  Home Medications   Prior to Admission medications   Medication Sig Start Date End Date Taking? Authorizing Provider  cyclobenzaprine (FLEXERIL) 10 MG tablet Take 0.5-1 tablets (5-10 mg total) by mouth 2 (two) times daily as needed for muscle spasms. 07/11/14   Tiffany Carlota Raspberry, PA-C  fluticasone (FLONASE) 50 MCG/ACT nasal spray Place 2 sprays into the nose daily. 12/21/10 12/21/11  Earlie Counts, MD  gabapentin (NEURONTIN) 100 MG capsule Take 1 capsule (100 mg total) by mouth 3 (three) times daily. 09/21/14   Waldemar Dickens, MD  ibuprofen (ADVIL,MOTRIN)  200 MG tablet Take 600 mg by mouth every 6 (six) hours as needed for moderate pain.    Historical Provider, MD  meloxicam (MOBIC) 15 MG tablet Take 0.5-1 tablets (7.5-15 mg total) by mouth daily. 09/21/14   Waldemar Dickens, MD  naproxen sodium (ANAPROX) 220 MG tablet Take 220 mg by mouth 2 (two) times daily as needed (for pain).    Historical Provider, MD  omeprazole (PRILOSEC) 20 MG capsule Take 1 capsule (20 mg total) by mouth daily. 07/11/14   Delos Haring, PA-C   Meds Ordered and Administered this Visit  Medications - No data to display  BP 129/89 mmHg  Pulse 88  Temp(Src) 98 F (36.7 C) (Oral)  Resp 16  SpO2 100% No data found.   Physical Exam Physical Exam  Constitutional: oriented to person, place, and time. appears well-developed and well-nourished. No distress.  HENT:  Head: Normocephalic and atraumatic.  Eyes: EOMI. PERRL.  Neck: Normal range of motion.  Cardiovascular: RRR, no m/r/g, 2+ distal pulses,  Pulmonary/Chest: Effort normal and breath sounds normal. No respiratory distress.  Abdominal: Soft. Bowel sounds are normal. NonTTP, no distension.  Musculoskeletal: Pain is worse when patient does cross body in upper and downward movements of the elbow and when pushing of the hand straightforward against resistance which engages the right pectoralis muscles.  Neurological: alert and oriented to person, place, and time.  Skin: Skin is warm. No rash noted. non diaphoretic.  Psychiatric: normal mood and affect. behavior is normal. Judgment and thought content  normal.   ED Course  Procedures (including critical care time)  Labs Review Labs Reviewed - No data to display  Imaging Review No results found.   Visual Acuity Review  Right Eye Distance:   Left Eye Distance:   Bilateral Distance:    Right Eye Near:   Left Eye Near:    Bilateral Near:         MDM   1. Non-cardiac chest pain    Musculoskeletal likely related to muscle strain but this is thought  to be ongoing for a year. No palpable mass associated with the chest wall or muscles. Start patient on meloxicam. If this does not improve symptoms patient will then use gabapentin. Strongly encouraged patient follow-up with sports medicine or with her primary care physician who can do more intensive intervention such as physical therapy, ultrasound, or MRI. Patient aware and will do so.    Waldemar Dickens, MD 09/21/14 2107

## 2014-11-21 ENCOUNTER — Encounter (HOSPITAL_COMMUNITY): Payer: Self-pay | Admitting: Emergency Medicine

## 2014-11-21 ENCOUNTER — Emergency Department (INDEPENDENT_AMBULATORY_CARE_PROVIDER_SITE_OTHER)
Admission: EM | Admit: 2014-11-21 | Discharge: 2014-11-21 | Disposition: A | Discharge status: Home / Self Care | Payer: Self-pay | Source: Home / Self Care | Attending: Family Medicine | Admitting: Family Medicine

## 2014-11-21 DIAGNOSIS — M7918 Myalgia, other site: Secondary | ICD-10-CM

## 2014-11-21 DIAGNOSIS — IMO0001 Reserved for inherently not codable concepts without codable children: Secondary | ICD-10-CM

## 2014-11-21 DIAGNOSIS — R030 Elevated blood-pressure reading, without diagnosis of hypertension: Secondary | ICD-10-CM

## 2014-11-21 DIAGNOSIS — G8929 Other chronic pain: Secondary | ICD-10-CM

## 2014-11-21 DIAGNOSIS — M791 Myalgia: Secondary | ICD-10-CM

## 2014-11-21 MED ORDER — MELOXICAM 15 MG PO TABS
15.0000 mg | ORAL_TABLET | Freq: Every day | ORAL | Status: DC | PRN
Start: 2014-11-21 — End: 2015-01-24

## 2014-11-21 NOTE — ED Notes (Signed)
Patient wanting refill of meloxicam.  Reports having recurrent right chest pain.  History of the same for one year.  Reports medicine he received at last visit helped pain, but is out of medicine.  Patient does know if he has a pcp.

## 2014-11-21 NOTE — ED Provider Notes (Signed)
CSN: [id number]     Arrival date & time 11/21/14  1555 History   First MD Initiated Contact with Patient 11/21/14 1608     No chief complaint on file.  (Consider location/radiation/quality/duration/timing/severity/associated sxs/prior Treatment) Patient is a 19 y.o. male presenting with chest pain.  Chest Pain Pain location:  R chest Pain quality: sharp   Pain radiates to:  R shoulder Pain radiates to the back: no   Pain severity:  Mild Onset quality:  Gradual Duration:  12 months Timing:  Intermittent Progression:  Waxing and waning Chronicity:  Chronic Context: movement   Context: not eating and no trauma   Relieved by: Mobic. He is out of medication and he is needing refill. Ineffective treatments:  None tried Associated symptoms: no anorexia and no anxiety   No SOB, no cough, no wheezing. At times when he has severe pain he might have problem with breathing. He currently feels okay but he need refill of his medication.  Elevated BP: He denies hx of HTN  Past Medical History  Diagnosis Date  . Adult ADHD    No past surgical history on file. No family history on file. Social History  Substance Use Topics  . Smoking status: Never Smoker   . Smokeless tobacco: Not on file  . Alcohol Use: Yes     Comment: rare    Review of Systems  Respiratory: Negative.   Cardiovascular: Positive for chest pain.  Gastrointestinal: Negative for anorexia.  Genitourinary: Negative.   All other systems reviewed and are negative.   Allergies  Bactrim  Home Medications   Prior to Admission medications   Medication Sig Start Date End Date Taking? Authorizing Provider  cyclobenzaprine (FLEXERIL) 10 MG tablet Take 0.5-1 tablets (5-10 mg total) by mouth 2 (two) times daily as needed for muscle spasms. 07/11/14   Tiffany Carlota Raspberry, PA-C  fluticasone (FLONASE) 50 MCG/ACT nasal spray Place 2 sprays into the nose daily. 12/21/10 12/21/11  Earlie Counts, MD  gabapentin (NEURONTIN) 100 MG capsule  Take 1 capsule (100 mg total) by mouth 3 (three) times daily. 09/21/14   Waldemar Dickens, MD  ibuprofen (ADVIL,MOTRIN) 200 MG tablet Take 600 mg by mouth every 6 (six) hours as needed for moderate pain.    Historical Provider, MD  meloxicam (MOBIC) 15 MG tablet Take 0.5-1 tablets (7.5-15 mg total) by mouth daily. 09/21/14   Waldemar Dickens, MD  naproxen sodium (ANAPROX) 220 MG tablet Take 220 mg by mouth 2 (two) times daily as needed (for pain).    Historical Provider, MD  omeprazole (PRILOSEC) 20 MG capsule Take 1 capsule (20 mg total) by mouth daily. 07/11/14   Delos Haring, PA-C   Meds Ordered and Administered this Visit  Medications - No data to display  BP 153/94 mmHg  Pulse 72  Temp(Src) 99.3 F (37.4 C) (Oral)  Resp 18  SpO2 97% No data found.   Physical Exam  Constitutional: He appears well-developed. No distress.  Cardiovascular: Normal rate, regular rhythm and normal heart sounds.   No murmur heard. Pulmonary/Chest: Effort normal and breath sounds normal. No respiratory distress. He has no wheezes. He exhibits no tenderness, no bony tenderness and no deformity.  Musculoskeletal: Normal range of motion.       Right shoulder: Normal.       Left shoulder: Normal.  Nursing note and vitals reviewed.   ED Course  Procedures (including critical care time)  Labs Review Labs Reviewed - No data to display  Imaging Review  No results found.   Visual Acuity Review  Right Eye Distance:   Left Eye Distance:   Bilateral Distance:    Right Eye Near:   Left Eye Near:    Bilateral Near:       Repeat BP done by me + 144/93  MDM  No diagnosis found. Musculoskeletal pain, chronic Elevated BP  Chest pain on going for more than 1 year. I reviewed his record and it seems he already got extensive workup done including labs and chest xray which were all normal. I refilled his Mobic and recommended f/u with PCP soon.  I discussed his BP which is slightly elevated with him.  Repeat BP checked by me improved a bit. Weight loss recommended and follow-up with PCP for reassessment discussed. He verbalized understanding.    Kinnie Feil, MD 11/21/14 [phone]

## 2014-11-21 NOTE — Discharge Instructions (Signed)

## 2015-01-18 ENCOUNTER — Emergency Department (INDEPENDENT_AMBULATORY_CARE_PROVIDER_SITE_OTHER): Payer: Self-pay

## 2015-01-18 ENCOUNTER — Inpatient Hospital Stay (HOSPITAL_COMMUNITY)
Admission: EM | Admit: 2015-01-18 | Discharge: 2015-01-24 | DRG: 177 | Disposition: A | Discharge status: Home / Self Care | Payer: Medicaid Other | Attending: Internal Medicine | Admitting: Internal Medicine

## 2015-01-18 ENCOUNTER — Encounter (HOSPITAL_COMMUNITY): Payer: Self-pay | Admitting: Emergency Medicine

## 2015-01-18 ENCOUNTER — Emergency Department (INDEPENDENT_AMBULATORY_CARE_PROVIDER_SITE_OTHER)
Admission: EM | Admit: 2015-01-18 | Discharge: 2015-01-18 | Disposition: A | Discharge status: Home / Self Care | Payer: Self-pay | Source: Home / Self Care | Attending: Family Medicine | Admitting: Family Medicine

## 2015-01-18 ENCOUNTER — Encounter (HOSPITAL_COMMUNITY): Payer: Self-pay

## 2015-01-18 DIAGNOSIS — Z882 Allergy status to sulfonamides status: Secondary | ICD-10-CM

## 2015-01-18 DIAGNOSIS — J189 Pneumonia, unspecified organism: Secondary | ICD-10-CM

## 2015-01-18 DIAGNOSIS — R938 Abnormal findings on diagnostic imaging of other specified body structures: Secondary | ICD-10-CM

## 2015-01-18 DIAGNOSIS — R590 Localized enlarged lymph nodes: Secondary | ICD-10-CM

## 2015-01-18 DIAGNOSIS — R918 Other nonspecific abnormal finding of lung field: Secondary | ICD-10-CM

## 2015-01-18 DIAGNOSIS — Z881 Allergy status to other antibiotic agents status: Secondary | ICD-10-CM

## 2015-01-18 DIAGNOSIS — D72829 Elevated white blood cell count, unspecified: Secondary | ICD-10-CM | POA: Diagnosis present

## 2015-01-18 DIAGNOSIS — J851 Abscess of lung with pneumonia: Principal | ICD-10-CM | POA: Diagnosis present

## 2015-01-18 DIAGNOSIS — R059 Cough, unspecified: Secondary | ICD-10-CM

## 2015-01-18 DIAGNOSIS — F908 Attention-deficit hyperactivity disorder, other type: Secondary | ICD-10-CM | POA: Diagnosis present

## 2015-01-18 DIAGNOSIS — R599 Enlarged lymph nodes, unspecified: Secondary | ICD-10-CM

## 2015-01-18 DIAGNOSIS — R05 Cough: Secondary | ICD-10-CM

## 2015-01-18 DIAGNOSIS — J159 Unspecified bacterial pneumonia: Secondary | ICD-10-CM | POA: Diagnosis present

## 2015-01-18 DIAGNOSIS — J181 Lobar pneumonia, unspecified organism: Secondary | ICD-10-CM

## 2015-01-18 DIAGNOSIS — J984 Other disorders of lung: Secondary | ICD-10-CM | POA: Diagnosis present

## 2015-01-18 DIAGNOSIS — J479 Bronchiectasis, uncomplicated: Secondary | ICD-10-CM | POA: Diagnosis present

## 2015-01-18 DIAGNOSIS — D649 Anemia, unspecified: Secondary | ICD-10-CM | POA: Diagnosis present

## 2015-01-18 DIAGNOSIS — D473 Essential (hemorrhagic) thrombocythemia: Secondary | ICD-10-CM | POA: Diagnosis present

## 2015-01-18 DIAGNOSIS — I808 Phlebitis and thrombophlebitis of other sites: Secondary | ICD-10-CM | POA: Diagnosis not present

## 2015-01-18 DIAGNOSIS — D75839 Thrombocytosis, unspecified: Secondary | ICD-10-CM | POA: Diagnosis present

## 2015-01-18 LAB — COMPREHENSIVE METABOLIC PANEL
ALT: 24 U/L (ref 17–63)
ANION GAP: 11 (ref 5–15)
AST: 16 U/L (ref 15–41)
Albumin: 2.6 g/dL — ABNORMAL LOW (ref 3.5–5.0)
Alkaline Phosphatase: 117 U/L (ref 38–126)
BILIRUBIN TOTAL: 0.2 mg/dL — AB (ref 0.3–1.2)
BUN: 7 mg/dL (ref 6–20)
CHLORIDE: 101 mmol/L (ref 101–111)
CO2: 25 mmol/L (ref 22–32)
Calcium: 8.9 mg/dL (ref 8.9–10.3)
Creatinine, Ser: 0.83 mg/dL (ref 0.61–1.24)
Glucose, Bld: 100 mg/dL — ABNORMAL HIGH (ref 65–99)
Potassium: 3.9 mmol/L (ref 3.5–5.1)
Sodium: 137 mmol/L (ref 135–145)
TOTAL PROTEIN: 7.2 g/dL (ref 6.5–8.1)

## 2015-01-18 LAB — CBC WITH DIFFERENTIAL/PLATELET
BASOS PCT: 0 %
Basophils Absolute: 0 10*3/uL (ref 0.0–0.1)
Eosinophils Absolute: 0 10*3/uL (ref 0.0–0.7)
Eosinophils Relative: 0 %
HEMATOCRIT: 36.2 % — AB (ref 39.0–52.0)
Hemoglobin: 11.9 g/dL — ABNORMAL LOW (ref 13.0–17.0)
Lymphocytes Relative: 12 %
Lymphs Abs: 2.5 10*3/uL (ref 0.7–4.0)
MCH: 26.9 pg (ref 26.0–34.0)
MCHC: 32.9 g/dL (ref 30.0–36.0)
MCV: 81.7 fL (ref 78.0–100.0)
MONOS PCT: 6 %
Monocytes Absolute: 1.2 10*3/uL — ABNORMAL HIGH (ref 0.1–1.0)
NEUTROS ABS: 17.1 10*3/uL — AB (ref 1.7–7.7)
Neutrophils Relative %: 82 %
PLATELETS: 416 10*3/uL — AB (ref 150–400)
RBC: 4.43 MIL/uL (ref 4.22–5.81)
RDW: 13.6 % (ref 11.5–15.5)
WBC: 20.8 10*3/uL — ABNORMAL HIGH (ref 4.0–10.5)

## 2015-01-18 LAB — RAPID HIV SCREEN (HIV 1/2 AB+AG)
HIV 1/2 ANTIBODIES: NONREACTIVE
HIV-1 P24 Antigen - HIV24: NONREACTIVE

## 2015-01-18 LAB — POCT RAPID STREP A: Streptococcus, Group A Screen (Direct): NEGATIVE

## 2015-01-18 MED ORDER — IPRATROPIUM-ALBUTEROL 0.5-2.5 (3) MG/3ML IN SOLN
RESPIRATORY_TRACT | Status: AC
  Filled 2015-01-18: qty 3

## 2015-01-18 MED ORDER — CEFTRIAXONE SODIUM 1 G IJ SOLR
1.0000 g | Freq: Once | INTRAMUSCULAR | Status: AC
  Administered 2015-01-18: 1 g via INTRAVENOUS
  Filled 2015-01-18: qty 10

## 2015-01-18 MED ORDER — IPRATROPIUM-ALBUTEROL 0.5-2.5 (3) MG/3ML IN SOLN
3.0000 mL | Freq: Once | RESPIRATORY_TRACT | Status: AC
  Administered 2015-01-18: 3 mL via RESPIRATORY_TRACT

## 2015-01-18 MED ORDER — DEXTROSE 5 % IV SOLN
500.0000 mg | Freq: Once | INTRAVENOUS | Status: AC
  Administered 2015-01-19: 500 mg via INTRAVENOUS
  Filled 2015-01-18: qty 500

## 2015-01-18 MED ORDER — SODIUM CHLORIDE 0.9 % IN NEBU
INHALATION_SOLUTION | RESPIRATORY_TRACT | Status: AC
  Filled 2015-01-18: qty 3

## 2015-01-18 NOTE — ED Notes (Signed)
MD at bedside at this time.

## 2015-01-18 NOTE — ED Notes (Signed)
Pt sent down from [facility] for chest xray confirming cavitary PNA and possible TB. Pt states he has been coughing up a little bit of blood and having night sweats. Pt states he works at [employer] and has been having a cough and these symptoms for about a month now.

## 2015-01-18 NOTE — ED Provider Notes (Signed)
CSN: [id number]     Arrival date & time 01/18/15  1531 History   First MD Initiated Contact with Patient 01/18/15 1657     Chief Complaint  Patient presents with  . Cough   (Consider location/radiation/quality/duration/timing/severity/associated sxs/prior Treatment) HPI Comments: 20 year old male complaining of a cough for one month. States it is worse when getting up in the morning. He states 2 days ago he felt as though he had a fever but did not measure it. Also has PND. Denies smoking or history of asthma. Is also concerned about a tender lump to the right medial supraclavicular area.   Past Medical History  Diagnosis Date  . Adult ADHD    History reviewed. No pertinent past surgical history. No family history on file. Social History  Substance Use Topics  . Smoking status: Never Smoker   . Smokeless tobacco: None  . Alcohol Use: Yes     Comment: rare    Review of Systems  Constitutional: Negative for fever, activity change, appetite change and fatigue.  HENT: Positive for postnasal drip. Negative for congestion, ear pain and rhinorrhea.   Respiratory: Positive for cough. Negative for shortness of breath.   Cardiovascular: Negative for chest pain and leg swelling.  Gastrointestinal: Negative.   Musculoskeletal: Negative.   Skin: Negative.   Neurological: Negative.   Psychiatric/Behavioral: Negative.   All other systems reviewed and are negative.   Allergies  Bactrim  Home Medications   Prior to Admission medications   Medication Sig Start Date End Date Taking? Authorizing Provider  cyclobenzaprine (FLEXERIL) 10 MG tablet Take 0.5-1 tablets (5-10 mg total) by mouth 2 (two) times daily as needed for muscle spasms. 07/11/14   Tiffany Carlota Raspberry, PA-C  fluticasone (FLONASE) 50 MCG/ACT nasal spray Place 2 sprays into the nose daily. 12/21/10 12/21/11  Earlie Counts, MD  gabapentin (NEURONTIN) 100 MG capsule Take 1 capsule (100 mg total) by mouth 3 (three) times daily. 09/21/14    Waldemar Dickens, MD  ibuprofen (ADVIL,MOTRIN) 200 MG tablet Take 600 mg by mouth every 6 (six) hours as needed for moderate pain.    Historical Provider, MD  meloxicam (MOBIC) 15 MG tablet Take 1 tablet (15 mg total) by mouth daily as needed for pain. 11/21/14   Kinnie Feil, MD  naproxen sodium (ANAPROX) 220 MG tablet Take 220 mg by mouth 2 (two) times daily as needed (for pain).    Historical Provider, MD  omeprazole (PRILOSEC) 20 MG capsule Take 1 capsule (20 mg total) by mouth daily. 07/11/14   Delos Haring, PA-C   Meds Ordered and Administered this Visit   Medications  ipratropium-albuterol (DUONEB) 0.5-2.5 (3) MG/3ML nebulizer solution 3 mL (3 mLs Nebulization Given 01/18/15 1814)    BP 121/78 mmHg  Pulse 96  Temp(Src) 99.7 F (37.6 C) (Oral)  SpO2 100% No data found.   Physical Exam  Constitutional: He appears well-developed and well-nourished. No distress.  HENT:  Mouth/Throat: No oropharyngeal exudate.  Bilateral TMs are normal Oropharynx with minor erythema scant clear PND.  Eyes: Conjunctivae and EOM are normal.  Neck: Normal range of motion. Neck supple.  Bilateral anterior cervical lymphadenopathy and multiple chains. There  are palpable and tender nodes in the right supraclavicular space as well.   Cardiovascular: Normal rate and regular rhythm.   Pulmonary/Chest: Effort normal.  Breath sounds with tidal volume with minimal wheezing. Deep breath and forced expiration produces distant expiratory wheezes and mildly prolonged expiratory phase and coughing spasms.  Musculoskeletal: Normal range of  motion.  Neurological: He is alert. No cranial nerve deficit. He exhibits normal muscle tone.  Skin: Skin is warm and dry.  Psychiatric: He has a normal mood and affect.  Nursing note and vitals reviewed.   ED Course  Procedures (including critical care time)  Labs Review Labs Reviewed  POCT RAPID STREP A    Imaging Review Dg Chest 2 View  01/18/2015  CLINICAL  DATA:  20 year old male with history of cough. Swollen lymph node on the right side of the neck. EXAM: CHEST  2 VIEW COMPARISON:  Chest x-ray 07/11/2014. FINDINGS: Airspace consolidation in the anterior aspect of the right upper lobe with apparent areas of cavitation and air-fluid levels noted. Right hilar fullness and right paratracheal fullness suggestive of adenopathy. Left lung is clear. No pleural effusions. No evidence of pulmonary edema. No pneumothorax. Heart size is normal. IMPRESSION: 1. Findings are concerning for cavitary pneumonia in the right upper lobe with associated right hilar and right paratracheal lymphadenopathy. This could represent typical organisms, or atypical organisms, including tuberculosis. Clinical correlation is strongly recommended. Followup PA and lateral chest X-ray is recommended in 3-4 weeks following trial of antibiotic therapy to ensure resolution and exclude underlying malignancy. Electronically Signed   By: Vinnie Langton M.D.   On: 01/18/2015 18:30     Visual Acuity Review  Right Eye Distance:   Left Eye Distance:   Bilateral Distance:    Right Eye Near:   Left Eye Near:    Bilateral Near:         MDM   1. Cough   2. Abnormal CXR with multiple nodules   3. Pulmonary consolidation determined by examination   4. Community acquired pneumonia   5. Hilar lymphadenopathy   6. Cervical lymphadenopathy    20 year old male who presents to the emergency department with a cough for one month has an abnormal x-ray showing a consolidation in the right upper lobe with apparent areas of cavitation and air-fluid levels. There is fullness suggestive of adenopathy. Findings are concerning for cavitary pneumonia in the right upper lobe as well as paratracheal lymphadenopathy. This could represent typical versus atypical organisms to include tuberculosis. Patient is being [facility] for [facility] evaluation. A mask is been placed on the  patient. Patient is in stable condition. Due to some wheezing he received a DuoNeb 2.5/5 mg. He states he believes he is breathing a little better.    Janne Napoleon, NP 01/18/15 [phone]

## 2015-01-18 NOTE — ED Notes (Signed)
Complains of a cough for a month, intermittently has a runny nose.  Patient reports coughing up phlegm that is described as green and blood tinged.  patient reports soreness in right neck/clavicale area.

## 2015-01-18 NOTE — ED Notes (Signed)
Notified melissa, rn of potential tb diagnosis

## 2015-01-18 NOTE — ED Provider Notes (Signed)
CSN: [id number]     Arrival date & time 01/18/15  1921 History   First MD Initiated Contact with Patient 01/18/15 2052     Chief Complaint  Patient presents with  . Cough  . Night Sweats     (Consider location/radiation/quality/duration/timing/severity/associated sxs/prior Treatment) HPI Comments: 20 year old male with no significant past medical history presents for cough and abnormal chest x-ray. The patient reports that over the last month he has had shortness of breath. He reports that over the last 24 hours he has had an increasing cough that actually kept him up throughout the night. He reports that the cough is mostly nonproductive but at times he does feel like he needs to bring sputum up. He denies any fever. He does report that he's been having night sweats. Denies any unexplained weight loss.  No recent travel. No known exposure to tuberculosis. He was seen at an urgent care prior to presentation to the ER. There they found that he had cavitary lesions in his right lung on chest x-ray. They sent him to the emergency department for admission and treatment. They told him that there was concern for possible malignancy or TB as well as pneumonia.   Past Medical History  Diagnosis Date  . Adult ADHD    History reviewed. No pertinent past surgical history. No family history on file. Social History  Substance Use Topics  . Smoking status: Never Smoker   . Smokeless tobacco: None  . Alcohol Use: Yes     Comment: rare    Review of Systems  Constitutional: Negative for fever, chills and fatigue.  HENT: Negative for congestion, postnasal drip and rhinorrhea.   Eyes: Negative for pain and redness.  Respiratory: Positive for cough and shortness of breath. Negative for chest tightness.   Cardiovascular: Negative for chest pain, palpitations and leg swelling.  Gastrointestinal: Negative for nausea, vomiting, abdominal pain and diarrhea.  Genitourinary: Negative for dysuria, urgency,  frequency and hematuria.  Musculoskeletal: Negative for myalgias and back pain.  Skin: Negative for rash.  Neurological: Negative for dizziness, weakness, light-headedness and headaches.  Hematological: Does not bruise/bleed easily.      Allergies  Bactrim  Home Medications   Prior to Admission medications   Medication Sig Start Date End Date Taking? Authorizing Provider  cyclobenzaprine (FLEXERIL) 10 MG tablet Take 0.5-1 tablets (5-10 mg total) by mouth 2 (two) times daily as needed for muscle spasms. 07/11/14   Tiffany Carlota Raspberry, PA-C  fluticasone (FLONASE) 50 MCG/ACT nasal spray Place 2 sprays into the nose daily. 12/21/10 12/21/11  Earlie Counts, MD  gabapentin (NEURONTIN) 100 MG capsule Take 1 capsule (100 mg total) by mouth 3 (three) times daily. 09/21/14   Waldemar Dickens, MD  ibuprofen (ADVIL,MOTRIN) 200 MG tablet Take 600 mg by mouth every 6 (six) hours as needed for moderate pain.    Historical Provider, MD  meloxicam (MOBIC) 15 MG tablet Take 1 tablet (15 mg total) by mouth daily as needed for pain. 11/21/14   Kinnie Feil, MD  naproxen sodium (ANAPROX) 220 MG tablet Take 220 mg by mouth 2 (two) times daily as needed (for pain).    Historical Provider, MD  omeprazole (PRILOSEC) 20 MG capsule Take 1 capsule (20 mg total) by mouth daily. 07/11/14   Tiffany Carlota Raspberry, PA-C   BP 116/69 mmHg  Pulse 98  Temp(Src) 99.3 F (37.4 C) (Oral)  Resp 20  Ht 5\' 9"  (1.753 m)  Wt 197 lb 1.6 oz (89.404 kg)  BMI 29.09  kg/m2  SpO2 100% Physical Exam  Constitutional: He is oriented to person, place, and time. He appears well-developed and well-nourished. No distress.  HENT:  Head: Normocephalic and atraumatic.  Right Ear: External ear normal.  Left Ear: External ear normal.  Mouth/Throat: Oropharynx is clear and moist. No oropharyngeal exudate.  Eyes: EOM are normal. Pupils are equal, round, and reactive to light.  Neck: Normal range of motion. Neck supple.  Cardiovascular: Normal rate,  regular rhythm, normal heart sounds and intact distal pulses.   No murmur heard. Pulmonary/Chest: Effort normal. No respiratory distress. He has no wheezes. He has no rales.  Abdominal: Soft. He exhibits no distension. There is no tenderness.  Musculoskeletal: He exhibits no edema.  Neurological: He is alert and oriented to person, place, and time.  Skin: Skin is warm and dry. No rash noted. He is not diaphoretic.  Vitals reviewed.   ED Course  Procedures (including critical care time) Labs Review Labs Reviewed  CBC WITH DIFFERENTIAL/PLATELET - Abnormal; Notable for the following:    WBC 20.8 (*)    Hemoglobin 11.9 (*)    HCT 36.2 (*)    Platelets 416 (*)    Neutro Abs 17.1 (*)    Monocytes Absolute 1.2 (*)    All other components within normal limits  COMPREHENSIVE METABOLIC PANEL - Abnormal; Notable for the following:    Glucose, Bld 100 (*)    Albumin 2.6 (*)    Total Bilirubin 0.2 (*)    All other components within normal limits  CULTURE, GROUP A STREP ([place])  CULTURE, EXPECTORATED SPUTUM-ASSESSMENT  AFB CULTURE WITH SMEAR  INFLUENZA PANEL BY PCR (TYPE A & B, H1N1)  RAPID HIV SCREEN (HIV 1/2 AB+AG)  C-REACTIVE PROTEIN  SEDIMENTATION RATE  QUANTIFERON TB GOLD ASSAY (BLOOD)    Imaging Review Dg Chest 2 View  01/18/2015  CLINICAL DATA:  20 year old male with history of cough. Swollen lymph node on the right side of the neck. EXAM: CHEST  2 VIEW COMPARISON:  Chest x-ray 07/11/2014. FINDINGS: Airspace consolidation in the anterior aspect of the right upper lobe with apparent areas of cavitation and air-fluid levels noted. Right hilar fullness and right paratracheal fullness suggestive of adenopathy. Left lung is clear. No pleural effusions. No evidence of pulmonary edema. No pneumothorax. Heart size is normal. IMPRESSION: 1. Findings are concerning for cavitary pneumonia in the right upper lobe with associated right hilar and right paratracheal lymphadenopathy. This could  represent typical organisms, or atypical organisms, including tuberculosis. Clinical correlation is strongly recommended. Followup PA and lateral chest X-ray is recommended in 3-4 weeks following trial of antibiotic therapy to ensure resolution and exclude underlying malignancy. Electronically Signed   By: Vinnie Langton M.D.   On: 01/18/2015 18:30   I have personally reviewed and evaluated these images and lab results as part of my medical decision-making.   EKG Interpretation None      MDM  Patient was seen and evaluated in stable condition. Benign examination. No wheezing noted on examination the patient did report getting a breathing treatment that helped with his breathing prior to presentation. Chest x-ray reviewed. Labs showed significant leukocytosis but were otherwise unremarkable. Patient without significant risk factors for TB. Patient was started on Rocephin and azithromycin for community-acquired pneumonia. Sputum culture was sent. Case was discussed with Dr. Tamala Julian from [facility] who agreed with admission. Patient was admitted under his care for further management. Final diagnoses:  CAP (community acquired pneumonia)  Cavitary lesion of lung  1. Community acquired pneumonia    Harvel Quale, MD 01/19/15 [phone]

## 2015-01-19 ENCOUNTER — Inpatient Hospital Stay (HOSPITAL_COMMUNITY): Payer: Medicaid Other

## 2015-01-19 ENCOUNTER — Encounter (HOSPITAL_COMMUNITY): Payer: Self-pay | Admitting: *Deleted

## 2015-01-19 DIAGNOSIS — J851 Abscess of lung with pneumonia: Secondary | ICD-10-CM | POA: Diagnosis present

## 2015-01-19 DIAGNOSIS — J984 Other disorders of lung: Secondary | ICD-10-CM

## 2015-01-19 DIAGNOSIS — D473 Essential (hemorrhagic) thrombocythemia: Secondary | ICD-10-CM | POA: Diagnosis present

## 2015-01-19 DIAGNOSIS — R590 Localized enlarged lymph nodes: Secondary | ICD-10-CM | POA: Diagnosis present

## 2015-01-19 DIAGNOSIS — J189 Pneumonia, unspecified organism: Secondary | ICD-10-CM | POA: Diagnosis present

## 2015-01-19 DIAGNOSIS — D649 Anemia, unspecified: Secondary | ICD-10-CM | POA: Diagnosis present

## 2015-01-19 DIAGNOSIS — F908 Attention-deficit hyperactivity disorder, other type: Secondary | ICD-10-CM | POA: Diagnosis present

## 2015-01-19 DIAGNOSIS — D72829 Elevated white blood cell count, unspecified: Secondary | ICD-10-CM | POA: Diagnosis present

## 2015-01-19 DIAGNOSIS — J159 Unspecified bacterial pneumonia: Secondary | ICD-10-CM | POA: Diagnosis present

## 2015-01-19 DIAGNOSIS — I808 Phlebitis and thrombophlebitis of other sites: Secondary | ICD-10-CM | POA: Diagnosis not present

## 2015-01-19 DIAGNOSIS — Z881 Allergy status to other antibiotic agents status: Secondary | ICD-10-CM | POA: Diagnosis not present

## 2015-01-19 DIAGNOSIS — J479 Bronchiectasis, uncomplicated: Secondary | ICD-10-CM | POA: Diagnosis present

## 2015-01-19 DIAGNOSIS — D75839 Thrombocytosis, unspecified: Secondary | ICD-10-CM | POA: Diagnosis present

## 2015-01-19 DIAGNOSIS — Z882 Allergy status to sulfonamides status: Secondary | ICD-10-CM | POA: Diagnosis not present

## 2015-01-19 HISTORY — DX: Other disorders of lung: J98.4

## 2015-01-19 LAB — EXPECTORATED SPUTUM ASSESSMENT W REFEX TO RESP CULTURE

## 2015-01-19 LAB — INFLUENZA PANEL BY PCR (TYPE A & B)
H1N1 flu by pcr: NOT DETECTED
INFLAPCR: NEGATIVE
Influenza B By PCR: NEGATIVE

## 2015-01-19 LAB — C-REACTIVE PROTEIN
CRP: 16.7 mg/dL — AB (ref <1.0)
CRP: 17 mg/dL — ABNORMAL HIGH (ref <1.0)

## 2015-01-19 LAB — SEDIMENTATION RATE: SED RATE: 131 mm/h — AB (ref 0–16)

## 2015-01-19 LAB — EXPECTORATED SPUTUM ASSESSMENT W GRAM STAIN, RFLX TO RESP C

## 2015-01-19 LAB — STREP PNEUMONIAE URINARY ANTIGEN: STREP PNEUMO URINARY ANTIGEN: NEGATIVE

## 2015-01-19 MED ORDER — ONDANSETRON HCL 4 MG PO TABS: 4.0000 mg | ORAL_TABLET | Freq: Four times a day (QID) | ORAL | Status: DC | PRN

## 2015-01-19 MED ORDER — IPRATROPIUM-ALBUTEROL 0.5-2.5 (3) MG/3ML IN SOLN: 3.0000 mL | RESPIRATORY_TRACT | Status: DC | PRN

## 2015-01-19 MED ORDER — MELOXICAM 15 MG PO TABS
15.0000 mg | ORAL_TABLET | Freq: Every day | ORAL | Status: DC
  Administered 2015-01-19: 15 mg via ORAL
  Filled 2015-01-19: qty 2
  Filled 2015-01-19: qty 1

## 2015-01-19 MED ORDER — DEXTROSE 5 % IV SOLN: 1.0000 g | INTRAVENOUS | Status: DC

## 2015-01-19 MED ORDER — GUAIFENESIN ER 600 MG PO TB12
600.0000 mg | ORAL_TABLET | Freq: Two times a day (BID) | ORAL | Status: DC
  Administered 2015-01-19 – 2015-01-24 (×11): 600 mg via ORAL
  Filled 2015-01-19 (×11): qty 1

## 2015-01-19 MED ORDER — BENZONATATE 100 MG PO CAPS
200.0000 mg | ORAL_CAPSULE | Freq: Two times a day (BID) | ORAL | Status: DC | PRN
  Administered 2015-01-20: 200 mg via ORAL
  Filled 2015-01-19: qty 2

## 2015-01-19 MED ORDER — DEXTROSE 5 % IV SOLN
500.0000 mg | INTRAVENOUS | Status: DC
  Administered 2015-01-19 – 2015-01-20 (×2): 500 mg via INTRAVENOUS
  Filled 2015-01-19 (×3): qty 500

## 2015-01-19 MED ORDER — FLUTICASONE PROPIONATE 50 MCG/ACT NA SUSP
2.0000 | Freq: Every day | NASAL | Status: DC
  Administered 2015-01-19 – 2015-01-24 (×4): 2 via NASAL
  Filled 2015-01-19: qty 16

## 2015-01-19 MED ORDER — HEPARIN SODIUM (PORCINE) 5000 UNIT/ML IJ SOLN
5000.0000 [IU] | Freq: Three times a day (TID) | INTRAMUSCULAR | Status: DC
  Administered 2015-01-19 – 2015-01-20 (×6): 5000 [IU] via SUBCUTANEOUS
  Filled 2015-01-19 (×7): qty 1

## 2015-01-19 MED ORDER — CEFTRIAXONE SODIUM 1 G IJ SOLR
1.0000 g | INTRAMUSCULAR | Status: DC
  Administered 2015-01-19 – 2015-01-20 (×2): 1 g via INTRAVENOUS
  Filled 2015-01-19 (×3): qty 10

## 2015-01-19 MED ORDER — PANTOPRAZOLE SODIUM 40 MG PO TBEC
40.0000 mg | DELAYED_RELEASE_TABLET | Freq: Every day | ORAL | Status: DC
  Administered 2015-01-19 – 2015-01-24 (×6): 40 mg via ORAL
  Filled 2015-01-19 (×6): qty 1

## 2015-01-19 MED ORDER — AZITHROMYCIN 500 MG IV SOLR: 500.0000 mg | INTRAVENOUS | Status: DC

## 2015-01-19 MED ORDER — SODIUM CHLORIDE 0.9 % IV SOLN
INTRAVENOUS | Status: DC
  Administered 2015-01-19 – 2015-01-21 (×5): via INTRAVENOUS

## 2015-01-19 MED ORDER — IOHEXOL 300 MG/ML  SOLN
75.0000 mL | Freq: Once | INTRAMUSCULAR | Status: AC | PRN
  Administered 2015-01-19: 75 mL via INTRAVENOUS

## 2015-01-19 MED ORDER — DEXTROSE 5 % IV SOLN
1.0000 g | INTRAVENOUS | Status: DC
  Filled 2015-01-19: qty 10

## 2015-01-19 MED ORDER — HYDROCODONE-ACETAMINOPHEN 5-325 MG PO TABS
1.0000 | ORAL_TABLET | ORAL | Status: DC | PRN
  Administered 2015-01-22: 1 via ORAL
  Filled 2015-01-19: qty 1

## 2015-01-19 MED ORDER — ONDANSETRON HCL 4 MG/2ML IJ SOLN: 4.0000 mg | Freq: Four times a day (QID) | INTRAMUSCULAR | Status: DC | PRN

## 2015-01-19 MED ORDER — MORPHINE SULFATE (PF) 2 MG/ML IV SOLN: 1.0000 mg | INTRAVENOUS | Status: DC | PRN

## 2015-01-19 NOTE — ED Notes (Signed)
Report had already been called to RN on [place] previously. Lauren, RN verified that she had already received report from this nurse.

## 2015-01-19 NOTE — Progress Notes (Addendum)
Pt seen and examined, admitted earlier this am 19/M with ongoing, cough, fever, night sweats, weight loss for months, no clear risk factor, no exposure to TB, No foreign travel. HIV negative, no IVDA, CXR with lung cavitation. Neg pressure isolation Will check Ct chest, AFB smear x3, Quantiferon gold pending Pulm consult, d/w Dr.Mannam, may need bronch if diagnosis not obtained with above  Jeffery Crane, [place]

## 2015-01-19 NOTE — Consult Note (Signed)
Name: Jeffery Crane MRN: [id number] DOB: Nov 14, 1995    ADMISSION DATE:  01/18/2015   CONSULTATION DATE: 01/19/15  REFERRING MD :  [facility]  Reason for consult:  Cough, fever and cavitary lesions on CXR  BRIEF PATIENT DESCRIPTION: 20 YO AA male with no significant medical history presenting with cough x1 month.  CXR showed cavitations in the right upper lobe and right hilar adenopathy with concern for TB.   SIGNIFICANT EVENTS  ~1 month: productive cough with yellow-green sputum  01/16: Urgent care>abnormal CXR>Admitted with right upper lobe cavitary pneumonia and possible TB   STUDIES:  CT chest 01/17>>   HISTORY OF PRESENT ILLNESS:  This is a 20 yo AA male with no significant medical history who presented first to urgent care with a productive cough x 1 month, fever and night sweats. His CXR suggested cavitary pneumonia and possible TB. He was then referred to [facility] hospital.  He reports persistent cough that is minimally productive of clear to yellow sputum but denies any hemoptysis, chills and chest pain. He states that he has been try to loose weight and did have a fever yesterday but did not measure his temperature. At the ED he was afebrile. He is not a smoker and has never been incarcerated. He denies any previous pulmonary infections and positive ppd test. 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  has no past surgical history on file. Prior to Admission medications   Medication Sig Start Date End Date Taking? Authorizing Provider  cyclobenzaprine (FLEXERIL) 10 MG tablet Take 0.5-1 tablets (5-10 mg total) by mouth 2 (two) times daily as needed for muscle spasms. 07/11/14   Tiffany Carlota Raspberry, PA-C  fluticasone (FLONASE) 50 MCG/ACT nasal spray Place 2 sprays into the nose daily. 12/21/10 12/21/11  Earlie Counts, MD  gabapentin (NEURONTIN) 100 MG capsule  Take 1 capsule (100 mg total) by mouth 3 (three) times daily. 09/21/14   Waldemar Dickens, MD  ibuprofen (ADVIL,MOTRIN) 200 MG tablet Take 600 mg by mouth every 6 (six) hours as needed for moderate pain.    Historical Provider, MD  meloxicam (MOBIC) 15 MG tablet Take 1 tablet (15 mg total) by mouth daily as needed for pain. 11/21/14   Kinnie Feil, MD  naproxen sodium (ANAPROX) 220 MG tablet Take 220 mg by mouth 2 (two) times daily as needed (for pain).    Historical Provider, MD  omeprazole (PRILOSEC) 20 MG capsule Take 1 capsule (20 mg total) by mouth daily. 07/11/14   Delos Haring, PA-C   Allergies  Allergen Reactions  . Bactrim Other (See Comments)    unknown    FAMILY HISTORY:  family history is not on file. SOCIAL HISTORY:  reports that he has never smoked. He does not have any smokeless tobacco history on file. He reports that he drinks alcohol. He reports that he does not use illicit drugs.  REVIEW OF SYSTEMS:   Constitutional: Negative for fever and chills but positive for voluntary weight loss HENT: Negative for congestion and rhinorrhea.  Eyes: Negative for redness and visual disturbance.  Respiratory: Negative for shortness of breath and wheezing but positive for cough and sputum Cardiovascular: Negative for chest pain and palpitations.  Gastrointestinal: Negative  for nausea , vomiting and abdominal pain and loose stools Genitourinary: Negative for dysuria and urgency.  Musculoskeletal: Negative for myalgias and arthralgias.  Skin: Negative for pallor and wound.  Neurological: Negative for dizziness and headaches  SUBJECTIVE:   VITAL SIGNS: Temp:  [99.3 F (37.4 C)-99.7 F (37.6 C)] 99.5 F (37.5 C) (01/17 0457) Pulse Rate:  [75-104] 95 (01/17 0457) Resp:  [19-20] 19 (01/17 0457) BP: (106-134)/(52-87) 116/56 mmHg (01/17 0457) SpO2:  [94 %-100 %] 94 % (01/17 0457) Weight:  [197 lb 1.6 oz (89.404 kg)] 197 lb 1.6 oz (89.404 kg) (01/16 1946)  PHYSICAL  EXAMINATION: General:  NA Neuro: AAO X4; no focal deficits HEENT: PERRLA, dentition intact Cardiovascular: RRR, S1/S2 Lungs: Unlabored, CTAB Abdomen: soft, NT/ND, normal BS Musculoskeletal:  No deformities Skin: No rash   Recent Labs Lab 01/18/15 2222  NA 137  K 3.9  CL 101  CO2 25  BUN 7  CREATININE 0.83  GLUCOSE 100*    Recent Labs Lab 01/18/15 2222  HGB 11.9*  HCT 36.2*  WBC 20.8*  PLT 416*   Dg Chest 2 View  01/18/2015  CLINICAL DATA:  20 year old male with history of cough. Swollen lymph node on the right side of the neck. EXAM: CHEST  2 VIEW COMPARISON:  Chest x-ray 07/11/2014. FINDINGS: Airspace consolidation in the anterior aspect of the right upper lobe with apparent areas of cavitation and air-fluid levels noted. Right hilar fullness and right paratracheal fullness suggestive of adenopathy. Left lung is clear. No pleural effusions. No evidence of pulmonary edema. No pneumothorax. Heart size is normal. IMPRESSION: 1. Findings are concerning for cavitary pneumonia in the right upper lobe with associated right hilar and right paratracheal lymphadenopathy. This could represent typical organisms, or atypical organisms, including tuberculosis. Clinical correlation is strongly recommended. Followup PA and lateral chest X-ray is recommended in 3-4 weeks following trial of antibiotic therapy to ensure resolution and exclude underlying malignancy. Electronically Signed   By: Vinnie Langton M.D.   On: 01/18/2015 18:30    ASSESSMENT / PLAN: 20 YO AA male presenting with right upper lobe cavitary lesions. He has no risk factors for cavitary disease. His weight loss is voluntary, HIV serology negative and besides a productive cough, he does not have any other symptoms that will be suggestive of TB or malignancy.  Right upper lobe cavitary pneumonia R/o TB R/o malignancy   Plan Sputum for AFB, and fungi  Blood cultures x2 Urine for strep pneumoniae and  legionella Quanteiferon tb gold assay Continue empiric antibiotics and f/u cultures ESR, CRP, ANCA and ANA Droplet/airborne precautions CT chest with contrast Will await culture and CT chest  results before considering a bronchoscopy   Total time spent examining patient, reviewing history and labs, and CXR is 35 minutes  Sherion Dooly [facility] Pulmonary and [place] Pager: [phone]  01/19/2015, 12:03 PM

## 2015-01-19 NOTE — Care Management Note (Signed)
Case Management Note  Patient Details  Name: Jeffery Crane MRN: [id number] Date of Birth: Apr 15, 1995  Subjective/Objective:                    Action/Plan:  Awaiting test results  Expected Discharge Date:                  Expected Discharge Plan:  Home/Self Care  In-House Referral:     Discharge planning Services     Post Acute Care Choice:    Choice offered to:     DME Arranged:    DME Agency:     HH Arranged:    [place] Agency:     Status of Service:  In process, will continue to follow  Medicare Important Message Given:    Date Medicare IM Given:    Medicare IM give by:    Date Additional Medicare IM Given:    Additional Medicare Important Message give by:     If discussed at [place] of Stay Meetings, dates discussed:    Additional Comments:  Marilu Favre, RN 01/19/2015, 10:12 AM

## 2015-01-19 NOTE — Progress Notes (Signed)
Gave pt. Urine specimen cup and instructed him that we need a urinary sample. He agreed that he understood.

## 2015-01-19 NOTE — H&P (Addendum)
[facility] History and Physical  Jeffery Crane TDS:[id number] DOB: 11-28-1995 DOA: 01/18/2015  Referring physician: ED PCP: [facility]   Chief Complaint: cough  HPI:  Jeffery Crane is a 20 year old male who was previously healthy; who presents for implant of a persistent cough. Symptoms have been on over the last month and progressively worsening. He reports a intermittently productive cough. Has tried Tylenol, Mucinex, over-the-counter Robitussin, and other cough medicines with only minimal relief of symptoms. Denies any travel outside of the [place] or outside of [place]. Denies any recent sick contacts to his knowledge. Associated symptoms include subjective fever, possible night sweats, and malaise. Denies complaints of shortness of breath. He does endorse weight loss of close to 10 - 15 pounds over the last few weeks, but states that he wanted to lose weight and has decrease the number of calories daily and takes along with intermittent exercise.  Upon admission patient was found to have elevated WBC of 20.8    Review of Systems  Constitutional: Positive for fever, weight loss and malaise/fatigue.  HENT: Negative for hearing loss and tinnitus.   Eyes: Negative for photophobia and pain.  Respiratory: Positive for cough and sputum production. Negative for hemoptysis, shortness of breath and wheezing.   Cardiovascular: Negative for chest pain, palpitations and orthopnea.  Gastrointestinal: Negative for vomiting and abdominal pain.  Genitourinary: Negative for dysuria, urgency and frequency.  Musculoskeletal: Negative for back pain and neck pain.  Skin: Negative for itching and rash.  Neurological: Negative for tremors and speech change.  Endo/Heme/Allergies: Negative for polydipsia. Does not bruise/bleed easily.  Psychiatric/Behavioral: Negative for suicidal ideas, hallucinations and substance abuse.       Past Medical History  Diagnosis Date  .  Adult ADHD      History reviewed. No pertinent past surgical history.    Social History:  reports that he has never smoked. He does not have any smokeless tobacco history on file. He reports that he drinks alcohol. He reports that he does not use illicit drugs.   Allergies  Allergen Reactions  . Bactrim Other (See Comments)    unknown    No family history on file.     Prior to Admission medications   Medication Sig Start Date End Date Taking? Authorizing Provider  cyclobenzaprine (FLEXERIL) 10 MG tablet Take 0.5-1 tablets (5-10 mg total) by mouth 2 (two) times daily as needed for muscle spasms. 07/11/14   Tiffany Carlota Raspberry, PA-C  fluticasone (FLONASE) 50 MCG/ACT nasal spray Place 2 sprays into the nose daily. 12/21/10 12/21/11  Earlie Counts, MD  gabapentin (NEURONTIN) 100 MG capsule Take 1 capsule (100 mg total) by mouth 3 (three) times daily. 09/21/14   Waldemar Dickens, MD  ibuprofen (ADVIL,MOTRIN) 200 MG tablet Take 600 mg by mouth every 6 (six) hours as needed for moderate pain.    Historical Provider, MD  meloxicam (MOBIC) 15 MG tablet Take 1 tablet (15 mg total) by mouth daily as needed for pain. 11/21/14   Kinnie Feil, MD  naproxen sodium (ANAPROX) 220 MG tablet Take 220 mg by mouth 2 (two) times daily as needed (for pain).    Historical Provider, MD  omeprazole (PRILOSEC) 20 MG capsule Take 1 capsule (20 mg total) by mouth daily. 07/11/14   Delos Haring, PA-C     Physical Exam: Filed Vitals:   01/18/15 1946 01/18/15 2300 01/19/15 0000  BP: 134/75 107/52 120/71  Pulse: 104 88 89  Temp: 99.3 F (37.4  C)    TempSrc: Oral    Resp: 20    Height: '5\' 9"'  (1.753 m)    Weight: 89.404 kg (197 lb 1.6 oz)    SpO2: 96% 97% 99%     Constitutional: Vital signs reviewed. Patient is a well-developed and well-nourished in no acute distress and cooperative with exam. Alert and oriented x3.  Head: Normocephalic and atraumatic  Ear: TM normal bilaterally  Mouth: no erythema or  exudates, MMM  Eyes: PERRL, EOMI, conjunctivae normal, No scleral icterus.  Neck: Supple, Trachea midline normal ROM, No JVD, mass, thyromegaly, or carotid bruit present.  Cardiovascular: RRR, S1 normal, S2 normal, no MRG, pulses symmetric and intact bilaterally  Pulmonary/Chest: CTAB, no wheezes, rales, or rhonchi  Abdominal: Soft. Non-tender, non-distended, bowel sounds are normal, no masses, organomegaly, or guarding present.  GU: no CVA tenderness Musculoskeletal: No joint deformities, erythema, or stiffness, ROM full and no nontender Ext: no edema and no cyanosis, pulses palpable bilaterally (DP and PT)  Hematology: Tender supraclavicular lymph node on the right  Neurological: A&O x3, Strenght is normal and symmetric bilaterally, cranial nerve II-XII are grossly intact, no focal motor deficit, sensory intact to light touch bilaterally.  Skin: Warm, dry and intact. No rash, cyanosis, or clubbing.  Psychiatric: Normal mood and affect. speech and behavior is normal. Judgment and thought content normal. Cognition and memory are normal.      Data Review   Micro Results No results found for this or any previous visit (from the past 240 hour(s)).  Radiology Reports Dg Chest 2 View  01/18/2015  CLINICAL DATA:  20 year old male with history of cough. Swollen lymph node on the right side of the neck. EXAM: CHEST  2 VIEW COMPARISON:  Chest x-ray 07/11/2014. FINDINGS: Airspace consolidation in the anterior aspect of the right upper lobe with apparent areas of cavitation and air-fluid levels noted. Right hilar fullness and right paratracheal fullness suggestive of adenopathy. Left lung is clear. No pleural effusions. No evidence of pulmonary edema. No pneumothorax. Heart size is normal. IMPRESSION: 1. Findings are concerning for cavitary pneumonia in the right upper lobe with associated right hilar and right paratracheal lymphadenopathy. This could represent typical organisms, or atypical organisms,  including tuberculosis. Clinical correlation is strongly recommended. Followup PA and lateral chest X-ray is recommended in 3-4 weeks following trial of antibiotic therapy to ensure resolution and exclude underlying malignancy. Electronically Signed   By: Vinnie Langton M.D.   On: 01/18/2015 18:30     CBC  Recent Labs Lab 01/18/15 2222  WBC 20.8*  HGB 11.9*  HCT 36.2*  PLT 416*  MCV 81.7  MCH 26.9  MCHC 32.9  RDW 13.6  LYMPHSABS 2.5  MONOABS 1.2*  EOSABS 0.0  BASOSABS 0.0    Chemistries   Recent Labs Lab 01/18/15 2222  NA 137  K 3.9  CL 101  CO2 25  GLUCOSE 100*  BUN 7  CREATININE 0.83  CALCIUM 8.9  AST 16  ALT 24  ALKPHOS 117  BILITOT 0.2*   ------------------------------------------------------------------------------------------------------------------ estimated creatinine clearance is 155.7 mL/min (by C-G formula based on Cr of 0.83). ------------------------------------------------------------------------------------------------------------------ No results for input(s): HGBA1C in the last 72 hours. ------------------------------------------------------------------------------------------------------------------ No results for input(s): CHOL, HDL, LDLCALC, TRIG, CHOLHDL, LDLDIRECT in the last 72 hours. ------------------------------------------------------------------------------------------------------------------ No results for input(s): TSH, T4TOTAL, T3FREE, THYROIDAB in the last 72 hours.  Invalid input(s): FREET3 ------------------------------------------------------------------------------------------------------------------ No results for input(s): VITAMINB12, FOLATE, FERRITIN, TIBC, IRON, RETICCTPCT in the last 72 hours.  Coagulation profile No  results for input(s): INR, PROTIME in the last 168 hours.  No results for input(s): DDIMER in the last 72 hours.  Cardiac Enzymes No results for input(s): CKMB, TROPONINI, MYOGLOBIN in the last 168  hours.  Invalid input(s): CK ------------------------------------------------------------------------------------------------------------------ Invalid input(s): POCBNP   CBG: No results for input(s): GLUCAP in the last 168 hours.        Assessment/Plan Principal Problem: Cavitary lesion of lung: Chest x-ray showing a cavitary pneumonia. Patient reports no previous travel or exposure to his knowledge. - Admit to MedSurg bed - Droplet precautions - Check ESR, CRP, IGRA - AFB cultures daily 3 days  - Consult pulmonology in am - Duonebs as needed for shortness of breath or wheezing - Mucinex - may warrent a CT scan for further evaluation  Possible community-acquired pneumonia: Patient found to be negative for influenza and HIV - Antibiotics of Rocephin and azithromycin - Sputum cultures  Leukocytosis: Acute. WBC elevated at 20.8 on admission  - Continue to monitor, recheck CBC in a.m.  Thrombocytosis ([place]): Acute. Platelet count elevated at 416. Suspect likely reactive to above. - continue to monitor  Anemia: Acute. Hemoglobin 11.9 with low limit of normal MCV and MCH. - Follow-up CBC  - may want to order iron studies  Code Status:   full Family Communication: bedside Disposition Plan: admit   Total time spent 55 minutes.Greater than 50% of this time was spent in counseling, explanation of diagnosis, planning of further management, and coordination of care  [facility] Pager [phone]  If 7PM-7AM, please contact night-coverage [email or web address] Password [facility] 01/19/2015, 12:34 AM

## 2015-01-19 NOTE — Progress Notes (Signed)
Pt. States he is not coughing up blood or anything else at this time.

## 2015-01-20 DIAGNOSIS — J852 Abscess of lung without pneumonia: Secondary | ICD-10-CM

## 2015-01-20 DIAGNOSIS — J189 Pneumonia, unspecified organism: Secondary | ICD-10-CM | POA: Diagnosis present

## 2015-01-20 DIAGNOSIS — J984 Other disorders of lung: Secondary | ICD-10-CM | POA: Diagnosis present

## 2015-01-20 LAB — LEGIONELLA ANTIGEN, URINE

## 2015-01-20 LAB — RHEUMATOID FACTOR: Rhuematoid fact SerPl-aCnc: 12.8 IU/mL (ref 0.0–13.9)

## 2015-01-20 MED ORDER — INFLUENZA VAC SPLIT QUAD 0.5 ML IM SUSY
0.5000 mL | PREFILLED_SYRINGE | INTRAMUSCULAR | Status: AC
  Administered 2015-01-22: 0.5 mL via INTRAMUSCULAR
  Filled 2015-01-20: qty 0.5

## 2015-01-20 MED ORDER — VORICONAZOLE 200 MG IV SOLR
4.0000 mg/kg | Freq: Two times a day (BID) | INTRAVENOUS | Status: DC
  Administered 2015-01-21 – 2015-01-23 (×4): 360 mg via INTRAVENOUS
  Filled 2015-01-20 (×8): qty 360

## 2015-01-20 MED ORDER — VORICONAZOLE 200 MG IV SOLR
6.0000 mg/kg | Freq: Two times a day (BID) | INTRAVENOUS | Status: AC
  Administered 2015-01-20 – 2015-01-21 (×2): 540 mg via INTRAVENOUS
  Filled 2015-01-20 (×3): qty 540

## 2015-01-20 NOTE — Consult Note (Signed)
Reason for Consult:Cavitary lung mass Referring Physician: Dr. Vaughan Browner- CCM  HAZAEL OLVEDA is an 20 y.o. male.   HPI: 20 yo man with no significant past history presented with a one month history of cough.  Mr. Dimaano is a 20 yo man who was in his usual state of health until about a month ago when he developed a persistent cough. It is intermittently productive of yellow sputum. He does note seeing a "speck" of blood in his sputum 2 days PTA and once this AM. The day PTA he had a fever but no chills or sweats. His cough worsened and kept him up all night. He also c/o general malaise and feeling short of breath. He went to an urgent care. A chest xray showed a cavitary right upper lobe mass. His WBC was 20K. He was sent to [facility] and admitted.  He does not have any contacts who are ill and denies travel or exposure to TB. HIV testing was negative. His AFB stains are negative. Gram stain revealed GPC in chains and a few GNR.  He currently is on Azithromycin and ceftriaxone. He has been afebrile since admission.  Past Medical History  Diagnosis Date  . Adult ADHD     History reviewed. No pertinent past surgical history.  No family history on file.  Social History:  reports that he has never smoked. He does not have any smokeless tobacco history on file. He reports that he drinks alcohol. He reports that he does not use illicit drugs.  Allergies:  Allergies  Allergen Reactions  . Bactrim Other (See Comments)    Unknown children reaction per pt's mom    Medications:  Scheduled: . azithromycin  500 mg Intravenous Q24H  . cefTRIAXone (ROCEPHIN)  IV  1 g Intravenous Q24H  . fluticasone  2 spray Each Nare Daily  . guaiFENesin  600 mg Oral BID  . heparin  5,000 Units Subcutaneous 3 times per day  . [START ON 01/21/2015] Influenza vac split quadrivalent PF  0.5 mL Intramuscular Tomorrow-1000  . pantoprazole  40 mg Oral Daily  . voriconazole  6 mg/kg Intravenous Q12H   Followed by  .  [START ON 01/21/2015] voriconazole  4 mg/kg Intravenous Q12H    Results for orders placed or performed during the hospital encounter of 01/18/15 (from the past 48 hour(s))  Culture, group A strep     Status: None (Preliminary result)   Collection Time: 01/18/15  5:44 PM  Result Value Ref Range   Specimen Description THROAT    Special Requests NONE    Culture CULTURE REINCUBATED FOR BETTER GROWTH    Report Status PENDING   CBC with Differential     Status: Abnormal   Collection Time: 01/18/15 10:22 PM  Result Value Ref Range   WBC 20.8 (H) 4.0 - 10.5 K/uL   RBC 4.43 4.22 - 5.81 MIL/uL   Hemoglobin 11.9 (L) 13.0 - 17.0 g/dL   HCT 36.2 (L) 39.0 - 52.0 %   MCV 81.7 78.0 - 100.0 fL   MCH 26.9 26.0 - 34.0 pg   MCHC 32.9 30.0 - 36.0 g/dL   RDW 13.6 11.5 - 15.5 %   Platelets 416 (H) 150 - 400 K/uL   Neutrophils Relative % 82 %   Lymphocytes Relative 12 %   Monocytes Relative 6 %   Eosinophils Relative 0 %   Basophils Relative 0 %   Neutro Abs 17.1 (H) 1.7 - 7.7 K/uL   Lymphs Abs 2.5 0.7 -  4.0 K/uL   Monocytes Absolute 1.2 (H) 0.1 - 1.0 K/uL   Eosinophils Absolute 0.0 0.0 - 0.7 K/uL   Basophils Absolute 0.0 0.0 - 0.1 K/uL  Comprehensive metabolic panel     Status: Abnormal   Collection Time: 01/18/15 10:22 PM  Result Value Ref Range   Sodium 137 135 - 145 mmol/L   Potassium 3.9 3.5 - 5.1 mmol/L   Chloride 101 101 - 111 mmol/L   CO2 25 22 - 32 mmol/L   Glucose, Bld 100 (H) 65 - 99 mg/dL   BUN 7 6 - 20 mg/dL   Creatinine, Ser 0.83 0.61 - 1.24 mg/dL   Calcium 8.9 8.9 - 10.3 mg/dL   Total Protein 7.2 6.5 - 8.1 g/dL   Albumin 2.6 (L) 3.5 - 5.0 g/dL   AST 16 15 - 41 U/L   ALT 24 17 - 63 U/L   Alkaline Phosphatase 117 38 - 126 U/L   Total Bilirubin 0.2 (L) 0.3 - 1.2 mg/dL   GFR calc non Af Amer >60 >60 mL/min   GFR calc Af Amer >60 >60 mL/min    Comment: (NOTE) The eGFR has been calculated using the CKD EPI equation. This calculation has not been validated in all clinical  situations. eGFR's persistently <60 mL/min signify possible Chronic Kidney Disease.    Anion gap 11 5 - 15  Rapid HIV screen (HIV 1/2 Ab+Ag)     Status: None   Collection Time: 01/18/15 10:22 PM  Result Value Ref Range   HIV-1 P24 Antigen - HIV24 NON REACTIVE NON REACTIVE   HIV 1/2 Antibodies NON REACTIVE NON REACTIVE   Interpretation (HIV Ag Ab)      A non reactive test result means that HIV 1 or HIV 2 antibodies and HIV 1 p24 antigen were not detected in the specimen.  Influenza panel by PCR (type A & B, H1N1)     Status: None   Collection Time: 01/18/15 11:07 PM  Result Value Ref Range   Influenza A By PCR NEGATIVE NEGATIVE   Influenza B By PCR NEGATIVE NEGATIVE   H1N1 flu by pcr NOT DETECTED NOT DETECTED    Comment:        The Xpert Flu assay (FDA approved for nasal aspirates or washes and nasopharyngeal swab specimens), is intended as an aid in the diagnosis of influenza and should not be used as a sole basis for treatment.   Culture, expectorated sputum-assessment     Status: None   Collection Time: 01/18/15 11:15 PM  Result Value Ref Range   Specimen Description EXPECTORATED SPUTUM    Special Requests NONE    Sputum evaluation      THIS SPECIMEN IS ACCEPTABLE. RESPIRATORY CULTURE REPORT TO FOLLOW.   Report Status 01/19/2015 FINAL   Culture, respiratory (NON-Expectorated)     Status: None (Preliminary result)   Collection Time: 01/19/15  1:01 AM  Result Value Ref Range   Specimen Description EXPECTORATED SPUTUM    Special Requests NONE    Gram Stain      RARE WBC PRESENT, PREDOMINANTLY PMN RARE SQUAMOUS EPITHELIAL CELLS PRESENT MODERATE GRAM POSITIVE COCCI IN PAIRS IN CHAINS FEW GRAM NEGATIVE RODS Performed at [facility]    Culture      Culture reincubated for better growth Performed at [facility]    Report Status PENDING   C-reactive protein     Status: Abnormal   Collection Time: 01/19/15  2:20 AM  Result Value Ref Range  CRP 16.7 (H)  <1.0 mg/dL  Culture, blood (routine x 2) Call MD if unable to obtain prior to antibiotics being given     Status: None (Preliminary result)   Collection Time: 01/19/15  6:07 AM  Result Value Ref Range   Specimen Description BLOOD LEFT HAND    Special Requests IN PEDIATRIC BOTTLE 3MLS    Culture NO GROWTH 1 DAY    Report Status PENDING   Sedimentation rate     Status: Abnormal   Collection Time: 01/19/15  6:22 AM  Result Value Ref Range   Sed Rate 131 (H) 0 - 16 mm/hr  Culture, blood (routine x 2) Call MD if unable to obtain prior to antibiotics being given     Status: None (Preliminary result)   Collection Time: 01/19/15  6:30 AM  Result Value Ref Range   Specimen Description BLOOD LEFT HAND    Special Requests IN PEDIATRIC BOTTLE 3MLS    Culture NO GROWTH 1 DAY    Report Status PENDING   Strep pneumoniae urinary antigen     Status: None   Collection Time: 01/19/15  8:00 AM  Result Value Ref Range   Strep Pneumo Urinary Antigen NEGATIVE NEGATIVE    Comment:        Infection due to S. pneumoniae cannot be absolutely ruled out since the antigen present may be below the detection limit of the test.   Legionella antigen, urine (not at [facility])     Status: None   Collection Time: 01/19/15  8:00 AM  Result Value Ref Range   Specimen Description URINE, CLEAN CATCH    Special Requests NONE    Legionella Antigen, Urine      Negative for Legionella pneumophila serogroup 1                                                              Legionella pneumophila serogroup 1 antigen can be detected in urine within 2 to 3 days of infection and may persist even after treatment. This  assay does not detect other Legionella species or serogroups. Performed at [facility]    Report Status 01/20/2015 FINAL   C-reactive protein     Status: Abnormal   Collection Time: 01/19/15  4:26 PM  Result Value Ref Range   CRP 17.0 (H) <1.0 mg/dL  Rheumatoid factor     Status: None   Collection Time:  01/19/15  4:26 PM  Result Value Ref Range   Rhuematoid fact SerPl-aCnc 12.8 0.0 - 13.9 IU/mL    Comment: (NOTE) Performed At: [facility] [place], [place] [id number] Lindon Romp MD ES:[id number]   AFB culture with smear     Status: None (Preliminary result)   Collection Time: 01/19/15  8:59 PM  Result Value Ref Range   Specimen Description SPUTUM    Special Requests NONE    Acid Fast Smear      NO ACID FAST BACILLI SEEN Performed at [facility]    Culture      CULTURE WILL BE EXAMINED FOR 6 WEEKS BEFORE ISSUING A FINAL REPORT Performed at [facility]    Report Status PENDING     Dg Chest 2 View  01/18/2015  CLINICAL DATA:  20 year old male with history of  cough. Swollen lymph node on the right side of the neck. EXAM: CHEST  2 VIEW COMPARISON:  Chest x-ray 07/11/2014. FINDINGS: Airspace consolidation in the anterior aspect of the right upper lobe with apparent areas of cavitation and air-fluid levels noted. Right hilar fullness and right paratracheal fullness suggestive of adenopathy. Left lung is clear. No pleural effusions. No evidence of pulmonary edema. No pneumothorax. Heart size is normal. IMPRESSION: 1. Findings are concerning for cavitary pneumonia in the right upper lobe with associated right hilar and right paratracheal lymphadenopathy. This could represent typical organisms, or atypical organisms, including tuberculosis. Clinical correlation is strongly recommended. Followup PA and lateral chest X-ray is recommended in 3-4 weeks following trial of antibiotic therapy to ensure resolution and exclude underlying malignancy. Electronically Signed   By: Vinnie Langton M.D.   On: 01/18/2015 18:30   Ct Chest W Contrast  01/19/2015  CLINICAL DATA:  Productive cough and hemoptysis for the past month, with night sweats. Radiographic findings suspicious for cavitary pneumonia on the right. EXAM: CT CHEST WITH CONTRAST TECHNIQUE:  Multidetector CT imaging of the chest was performed during intravenous contrast administration. CONTRAST:  52m OMNIPAQUE IOHEXOL 300 MG/ML  SOLN COMPARISON:  Chest radiographs, the most recent dated 01/18/2015 and 07/11/2014. FINDINGS: There is a triangular area of dense consolidation containing cylindrically dilated bronchi in the inferior, anterior aspect of the right upper lobe. This contains a thin walled cavitary component laterally containing an air-fluid level. This component measures 2.7 x 2.4 cm on image number 28. There is also a thick walled, rounded component with an irregular central cavitary component. This thick-walled component is located superiorly and anteriorly and measures 3.7 x 3.2 cm on image number 23. More inferiorly and medially, this has a solid component extending into the anterior mediastinum. This solid component measures 5.3 x 4.2 cm on image number 24. The cavitary and solid appearing component combined measure 8.7 x 6.6 cm in maximum diameter on image number 27, including extension into the mediastinum. This is causing mild compression and flattening of the anterolateral aspect of the superior vena cava. Also demonstrated are multiple enlarged mediastinal and right hilar lymph nodes. The largest node is a right paratracheal node with a short axis diameter of 2.8 cm on image 18. A right hilar node has a short axis diameter of 1.3 cm on image number 28. The left lung is clear. The upper abdomen is unremarkable. The bones appear normal. IMPRESSION: 1. Large, multi cavitary mass-like area in the anterior, medial aspect of the right upper lobe with associated cylindrical bronchiectasis, air-fluid level and mediastinal invasion. This was not present on 07/11/2014 and is most likely infectious in origin, with differential considerations including tuberculosis and fungal infection. A rapidly growing, cavitary neoplasm is less likely. 2. Mediastinal and right hilar adenopathy. This could be  reactive or metastatic. Electronically Signed   By: SClaudie ReveringM.D.   On: 01/19/2015 14:56    Review of Systems  Constitutional: Positive for fever, weight loss (10 pounds over past month) and malaise/fatigue. Negative for chills and diaphoresis.  HENT: Negative for nosebleeds.   Eyes: Negative for blurred vision and double vision.  Respiratory: Positive for cough, sputum production and shortness of breath.   Cardiovascular: Negative for chest pain.  Gastrointestinal: Negative for abdominal pain.  Genitourinary: Negative for dysuria.  Musculoskeletal: Negative for myalgias.  Neurological: Negative.  Negative for headaches.   Blood pressure 116/60, pulse 87, temperature 99.4 F (37.4 C), temperature source Oral, resp. rate 18, height  '5\' 9"'  (1.753 m), weight 197 lb 1.6 oz (89.404 kg), SpO2 100 %. Physical Exam  Vitals reviewed. Constitutional: He is oriented to person, place, and time. He appears well-developed and well-nourished. No distress.  HENT:  Head: Normocephalic and atraumatic.  Eyes: Conjunctivae and EOM are normal. No scleral icterus.  Neck: Neck supple.  Cardiovascular: Normal rate, regular rhythm, normal heart sounds and intact distal pulses.  Exam reveals no gallop and no friction rub.   No murmur heard. Respiratory: Effort normal and breath sounds normal. He has no wheezes.  GI: Soft. He exhibits no distension. There is no tenderness.  Musculoskeletal: He exhibits no edema.  Lymphadenopathy:    He has cervical adenopathy (tender to palpation left anterior cervical area with questionable node in that area).  Neurological: He is alert and oriented to person, place, and time. No cranial nerve deficit.  Motor intact  Skin: Skin is warm and dry.  Psychiatric: He has a normal mood and affect.    Assessment/Plan: 20 yo man with a "cavitary lung mass" in the right upper lobe.   This is most likely a necrotizing pneumonia/ lung abscess with reactive mediastinal  adenopathy. He is already being treated with empiric antibiotics. I will defer choice of antibiotics to the Hospitalists/ CCM.   He should have a bronchoscopy during this admission. I will defer to Pulmonary on that issue.  There is no indication for surgery currently.  There is an outside chance there could be some other process going here as he is relatively "non-toxic" for what would be a severe infection to cause this degree of damage. A garden variety lung cancer is highly unlikely. I think a lymphoma is also unlikely but not impossible. Assuming he improves clinically with antibiotics and is able to go home, he will need radiographic follow up to ensure the process clears completely.  [name] 01/20/2015, 4:01 PM

## 2015-01-20 NOTE — Progress Notes (Signed)
Name: Jeffery Crane MRN: [id number] DOB: [birth year]    ADMISSION DATE:  01/18/2015   CONSULTATION DATE: 01/19/15  REFERRING MD :  [facility]  Reason for consult:  Cough, fever and cavitary lesions on CXR  BRIEF PATIENT DESCRIPTION: Jeffery Crane is a 20 year old with a significant past medical history admitted with cough, fever. Found to have cavitary lesions and chest x-ray. Patient has no risk factors for TB or any other significant exposures.No significant travel history. No incarcerations. HIV is negative.  SIGNIFICANT EVENTS  ~1 month: productive cough with yellow-green sputum  01/16: Urgent care>abnormal CXR>Admitted with right upper lobe cavitary pneumonia and possible TB  STUDIES:  CT chest 01/17>> large multicavitary mass-like area in RUL with bronchiectasis, air fluid level, and mediastinal invasion. New from 07/11/2014. Mediastinal and hilar LAN.   SUBJECTIVE:   VITAL SIGNS: Temp:  [98.9 F (37.2 C)] 98.9 F (37.2 C) (01/17 2109) Pulse Rate:  [91-99] 91 (01/17 2109) Resp:  [16] 16 (01/17 2109) BP: (104-124)/(52-65) 104/52 mmHg (01/17 2109) SpO2:  [97 %-98 %] 97 % (01/17 2109)  PHYSICAL EXAMINATION: General:  NA Neuro: AAO X4; no focal deficits HEENT: PERRLA, dentition intact Cardiovascular: RRR, S1/S2 Lungs: Unlabored, CTAB Abdomen: soft, NT/ND, normal BS Musculoskeletal:  No deformities Skin: No rash   Recent Labs Lab 01/18/15 2222  NA 137  K 3.9  CL 101  CO2 25  BUN 7  CREATININE 0.83  GLUCOSE 100*    Recent Labs Lab 01/18/15 2222  HGB 11.9*  HCT 36.2*  WBC 20.8*  PLT 416*   Dg Chest 2 View  01/18/2015  CLINICAL DATA:  20 year old male with history of cough. Swollen lymph node on the right side of the neck. EXAM: CHEST  2 VIEW COMPARISON:  Chest x-ray 07/11/2014. FINDINGS: Airspace consolidation in the anterior aspect of the right upper lobe with apparent areas of cavitation and air-fluid levels noted. Right hilar fullness and right  paratracheal fullness suggestive of adenopathy. Left lung is clear. No pleural effusions. No evidence of pulmonary edema. No pneumothorax. Heart size is normal. IMPRESSION: 1. Findings are concerning for cavitary pneumonia in the right upper lobe with associated right hilar and right paratracheal lymphadenopathy. This could represent typical organisms, or atypical organisms, including tuberculosis. Clinical correlation is strongly recommended. Followup PA and lateral chest X-ray is recommended in 3-4 weeks following trial of antibiotic therapy to ensure resolution and exclude underlying malignancy. Electronically Signed   By: Vinnie Langton M.D.   On: 01/18/2015 18:30   Ct Chest W Contrast  01/19/2015  CLINICAL DATA:  Productive cough and hemoptysis for the past month, with night sweats. Radiographic findings suspicious for cavitary pneumonia on the right. EXAM: CT CHEST WITH CONTRAST TECHNIQUE: Multidetector CT imaging of the chest was performed during intravenous contrast administration. CONTRAST:  49m OMNIPAQUE IOHEXOL 300 MG/ML  SOLN COMPARISON:  Chest radiographs, the most recent dated 01/18/2015 and 07/11/2014. FINDINGS: There is a triangular area of dense consolidation containing cylindrically dilated bronchi in the inferior, anterior aspect of the right upper lobe. This contains a thin walled cavitary component laterally containing an air-fluid level. This component measures 2.7 x 2.4 cm on image number 28. There is also a thick walled, rounded component with an irregular central cavitary component. This thick-walled component is located superiorly and anteriorly and measures 3.7 x 3.2 cm on image number 23. More inferiorly and medially, this has a solid component extending into the anterior mediastinum. This solid component measures 5.3 x 4.2 cm on  image number 24. The cavitary and solid appearing component combined measure 8.7 x 6.6 cm in maximum diameter on image number 27, including extension into  the mediastinum. This is causing mild compression and flattening of the anterolateral aspect of the superior vena cava. Also demonstrated are multiple enlarged mediastinal and right hilar lymph nodes. The largest node is a right paratracheal node with a short axis diameter of 2.8 cm on image 18. A right hilar node has a short axis diameter of 1.3 cm on image number 28. The left lung is clear. The upper abdomen is unremarkable. The bones appear normal. IMPRESSION: 1. Large, multi cavitary mass-like area in the anterior, medial aspect of the right upper lobe with associated cylindrical bronchiectasis, air-fluid level and mediastinal invasion. This was not present on 07/11/2014 and is most likely infectious in origin, with differential considerations including tuberculosis and fungal infection. A rapidly growing, cavitary neoplasm is less likely. 2. Mediastinal and right hilar adenopathy. This could be reactive or metastatic. Electronically Signed   By: Claudie Revering M.D.   On: 01/19/2015 14:56    ASSESSMENT / PLAN:  BCx2 >>> AFB >>> Urine Strep > Negative Urine Legionella >>> Fungal Cx >>> Quant Gold >>> ESR > 131 CRP > 16.7 ANCA >>> ANA >>> RF > 12.8 wnl Flu PCR > Negative HIV > non reactive  20 YO AA male presenting with right upper lobe cavitary lesions. He has no risk factors for cavitary disease. His weight loss is voluntary, HIV serology negative and besides a productive cough, he does not have any other symptoms that will be suggestive of TB or malignancy. CT demonstrated multi-cavitary mass-like abnormality in RUL, lung abscess with mediastinal invasion.  Right upper lobe cavitary pneumonia Lung abscess with mediastinal invasion R/o TB, fungal infection R/o malignancy   Plan -Recommend CVTS consultation for potential surgical treatment options -Sputum for AFB, fungi, culture -Blood cultures x2 -Urine for strep pneumoniae and legionella -Quanteiferon tb gold assay -Continue  empiric antibiotics (rocephin, azithro) -Add voriconazole for anti-fungal coverage -ESR, CRP, ANCA and ANA -Droplet/airborne precautions -Will await culture and CT chest results before considering a bronchoscopy   Georgann Housekeeper, AGACNP-BC [facility] Pager [phone] or [phone]  01/20/2015 10:21 AM

## 2015-01-20 NOTE — Progress Notes (Signed)
[facility] PROGRESS NOTE   Jeffery Crane MRN:[id number] DOB: 01/23/1995 DOA: 01/18/2015 PCP: [facility]  HPI/Subjective: Seen with father at bedside, denies any complaints. PCCM please advise, if this considered pneumonia, cavitation is frequently caused by staph,? Vanc or Zyvox.  Assessment/Plan: Principal Problem:   Cavitary lesion of lung Active Problems:   Leukocytosis   Thrombocytosis (HCC)   Anemia   Cavitary lesion of lung -Chest x-ray showing a cavitary pneumonia. Patient reports no previous travel or exposure to his knowledge. -Negative screening for HIV and influenza. -Check ESR, CRP, IGRA -AFB cultures daily 3 days, QuantiFERON gold TB test is pending  -PCCM consulted, recommended CTS surgery consultation because of mediastinal invasion of the abscess.  Possible community-acquired pneumonia:  - Antibiotics of Rocephin and azithromycin - Sputum cultures, supportive management with bronchodilators, mucolytics, antitussives and oxygen as needed.   Leukocytosis: Acute. WBC elevated at 20.8 on admission - Continue to monitor, recheck CBC in a.m.  Thrombocytosis (HCC) -Platelet count elevated at 416. Suspect likely reactive to above. -Continue to monitor  Anemia: Acute Hemoglobin 11.9 with low limit of normal MCV and MCH. - Follow-up CBC  - may want to order iron studies  Code Status: Full Code Family Communication: Plan discussed with the patient. Disposition Plan: Remains inpatient Diet: Diet Heart Room service appropriate?: Yes; Fluid consistency:: Thin  Consultants:  PCCM.  CTS  Procedures:  None  Antibiotics:  Rocephin and azithromycin started on 01/19/15   Objective: Filed Vitals:   01/19/15 1414 01/19/15 2109  BP: 124/65 104/52  Pulse: 99 91  Temp: 98.9 F (37.2 C) 98.9 F (37.2 C)  Resp: 16 16    Intake/Output Summary (Last 24 hours) at 01/20/15 1333 Last data filed at 01/19/15 2110  Gross  per 24 hour  Intake    480 ml  Output      3 ml  Net    477 ml   Filed Weights   01/18/15 1946  Weight: 89.404 kg (197 lb 1.6 oz)    Exam: General: Alert and awake, oriented x3, not in any acute distress. HEENT: anicteric sclera, pupils reactive to light and accommodation, EOMI CVS: S1-S2 clear, no murmur rubs or gallops Chest: clear to auscultation bilaterally, no wheezing, rales or rhonchi Abdomen: soft nontender, nondistended, normal bowel sounds, no organomegaly Extremities: no cyanosis, clubbing or edema noted bilaterally Neuro: Cranial nerves II-XII intact, no focal neurological deficits  Data Reviewed: Basic Metabolic Panel:  Recent Labs Lab 01/18/15 2222  NA 137  K 3.9  CL 101  CO2 25  GLUCOSE 100*  BUN 7  CREATININE 0.83  CALCIUM 8.9   Liver Function Tests:  Recent Labs Lab 01/18/15 2222  AST 16  ALT 24  ALKPHOS 117  BILITOT 0.2*  PROT 7.2  ALBUMIN 2.6*   No results for input(s): LIPASE, AMYLASE in the last 168 hours. No results for input(s): AMMONIA in the last 168 hours. CBC:  Recent Labs Lab 01/18/15 2222  WBC 20.8*  NEUTROABS 17.1*  HGB 11.9*  HCT 36.2*  MCV 81.7  PLT 416*   Cardiac Enzymes: No results for input(s): CKTOTAL, CKMB, CKMBINDEX, TROPONINI in the last 168 hours. BNP (last 3 results) No results for input(s): BNP in the last 8760 hours.  ProBNP (last 3 results) No results for input(s): PROBNP in the last 8760 hours.  CBG: No results for input(s): GLUCAP in the last 168 hours.  Micro Recent Results (from the past 240 hour(s))  Culture, group   A strep     Status: None (Preliminary result)   Collection Time: 01/18/15  5:44 PM  Result Value Ref Range Status   Specimen Description THROAT  Final   Special Requests NONE  Final   Culture CULTURE REINCUBATED FOR BETTER GROWTH  Final   Report Status PENDING  Incomplete  Culture, expectorated sputum-assessment     Status: None   Collection Time: 01/18/15 11:15 PM  Result  Value Ref Range Status   Specimen Description EXPECTORATED SPUTUM  Final   Special Requests NONE  Final   Sputum evaluation   Final    THIS SPECIMEN IS ACCEPTABLE. RESPIRATORY CULTURE REPORT TO FOLLOW.   Report Status 01/19/2015 FINAL  Final  Culture, respiratory (NON-Expectorated)     Status: None (Preliminary result)   Collection Time: 01/19/15  1:01 AM  Result Value Ref Range Status   Specimen Description EXPECTORATED SPUTUM  Final   Special Requests NONE  Final   Gram Stain PENDING  Incomplete   Culture   Final    Culture reincubated for better growth Performed at [facility]    Report Status PENDING  Incomplete  Culture, blood (routine x 2) Call MD if unable to obtain prior to antibiotics being given     Status: None (Preliminary result)   Collection Time: 01/19/15  6:07 AM  Result Value Ref Range Status   Specimen Description BLOOD LEFT HAND  Final   Special Requests IN PEDIATRIC BOTTLE 3MLS  Final   Culture NO GROWTH 1 DAY  Final   Report Status PENDING  Incomplete  Culture, blood (routine x 2) Call MD if unable to obtain prior to antibiotics being given     Status: None (Preliminary result)   Collection Time: 01/19/15  6:30 AM  Result Value Ref Range Status   Specimen Description BLOOD LEFT HAND  Final   Special Requests IN PEDIATRIC BOTTLE 3MLS  Final   Culture NO GROWTH 1 DAY  Final   Report Status PENDING  Incomplete     Studies: Dg Chest 2 View  01/18/2015  CLINICAL DATA:  20 year old male with history of cough. Swollen lymph node on the right side of the neck. EXAM: CHEST  2 VIEW COMPARISON:  Chest x-ray 07/11/2014. FINDINGS: Airspace consolidation in the anterior aspect of the right upper lobe with apparent areas of cavitation and air-fluid levels noted. Right hilar fullness and right paratracheal fullness suggestive of adenopathy. Left lung is clear. No pleural effusions. No evidence of pulmonary edema. No pneumothorax. Heart size is normal. IMPRESSION: 1.  Findings are concerning for cavitary pneumonia in the right upper lobe with associated right hilar and right paratracheal lymphadenopathy. This could represent typical organisms, or atypical organisms, including tuberculosis. Clinical correlation is strongly recommended. Followup PA and lateral chest X-ray is recommended in 3-4 weeks following trial of antibiotic therapy to ensure resolution and exclude underlying malignancy. Electronically Signed   By: Vinnie Langton M.D.   On: 01/18/2015 18:30   Ct Chest W Contrast  01/19/2015  CLINICAL DATA:  Productive cough and hemoptysis for the past month, with night sweats. Radiographic findings suspicious for cavitary pneumonia on the right. EXAM: CT CHEST WITH CONTRAST TECHNIQUE: Multidetector CT imaging of the chest was performed during intravenous contrast administration. CONTRAST:  60m OMNIPAQUE IOHEXOL 300 MG/ML  SOLN COMPARISON:  Chest radiographs, the most recent dated 01/18/2015 and 07/11/2014. FINDINGS: There is a triangular area of dense consolidation containing cylindrically dilated bronchi in the inferior, anterior aspect of the right upper  lobe. This contains a thin walled cavitary component laterally containing an air-fluid level. This component measures 2.7 x 2.4 cm on image number 28. There is also a thick walled, rounded component with an irregular central cavitary component. This thick-walled component is located superiorly and anteriorly and measures 3.7 x 3.2 cm on image number 23. More inferiorly and medially, this has a solid component extending into the anterior mediastinum. This solid component measures 5.3 x 4.2 cm on image number 24. The cavitary and solid appearing component combined measure 8.7 x 6.6 cm in maximum diameter on image number 27, including extension into the mediastinum. This is causing mild compression and flattening of the anterolateral aspect of the superior vena cava. Also demonstrated are multiple enlarged mediastinal and  right hilar lymph nodes. The largest node is a right paratracheal node with a short axis diameter of 2.8 cm on image 18. A right hilar node has a short axis diameter of 1.3 cm on image number 28. The left lung is clear. The upper abdomen is unremarkable. The bones appear normal. IMPRESSION: 1. Large, multi cavitary mass-like area in the anterior, medial aspect of the right upper lobe with associated cylindrical bronchiectasis, air-fluid level and mediastinal invasion. This was not present on 07/11/2014 and is most likely infectious in origin, with differential considerations including tuberculosis and fungal infection. A rapidly growing, cavitary neoplasm is less likely. 2. Mediastinal and right hilar adenopathy. This could be reactive or metastatic. Electronically Signed   By: Claudie Revering M.D.   On: 01/19/2015 14:56    Scheduled Meds: . azithromycin  500 mg Intravenous Q24H  . cefTRIAXone (ROCEPHIN)  IV  1 g Intravenous Q24H  . fluticasone  2 spray Each Nare Daily  . guaiFENesin  600 mg Oral BID  . heparin  5,000 Units Subcutaneous 3 times per day  . [START ON 01/21/2015] Influenza vac split quadrivalent PF  0.5 mL Intramuscular Tomorrow-1000  . pantoprazole  40 mg Oral Daily  . voriconazole  6 mg/kg Intravenous Q12H   Followed by  . [START ON 01/21/2015] voriconazole  4 mg/kg Intravenous Q12H   Continuous Infusions: . sodium chloride 100 mL/hr at 01/20/15 0037       Time spent: 35 minutes    [facility]  [facility] Pager [phone] If 7PM-7AM, please contact night-coverage at [email or web address], password [facility] 01/20/2015, 1:33 PM  LOS: 1 day

## 2015-01-20 NOTE — Progress Notes (Signed)
ANTIBIOTIC CONSULT NOTE - INITIAL  Pharmacy Consult for Voriconazole Indication: RUL Cavitary PNA (r/o fungal etiology)  Allergies  Allergen Reactions  . Bactrim Other (See Comments)    Unknown children reaction per pt's mom    Patient Measurements: Height: 5\' 9"  (175.3 cm) Weight: 197 lb 1.6 oz (89.404 kg) IBW/kg (Calculated) : 70.7   Vital Signs:   Intake/Output from previous day: 01/17 0701 - 01/18 0700 In: 1560 [P.O.:1560] Out: 6 [Urine:6] Intake/Output from this shift:    Labs:  Recent Labs  01/18/15 2222  WBC 20.8*  HGB 11.9*  PLT 416*  CREATININE 0.83   Estimated Creatinine Clearance: 155.7 mL/min (by C-G formula based on Cr of 0.83). No results for input(s): VANCOTROUGH, VANCOPEAK, VANCORANDOM, GENTTROUGH, GENTPEAK, GENTRANDOM, TOBRATROUGH, TOBRAPEAK, TOBRARND, AMIKACINPEAK, AMIKACINTROU, AMIKACIN in the last 72 hours.   Microbiology: Recent Results (from the past 720 hour(s))  Culture, group A strep     Status: None (Preliminary result)   Collection Time: 01/18/15  5:44 PM  Result Value Ref Range Status   Specimen Description THROAT  Final   Special Requests NONE  Final   Culture CULTURE REINCUBATED FOR BETTER GROWTH  Final   Report Status PENDING  Incomplete  Culture, expectorated sputum-assessment     Status: None   Collection Time: 01/18/15 11:15 PM  Result Value Ref Range Status   Specimen Description EXPECTORATED SPUTUM  Final   Special Requests NONE  Final   Sputum evaluation   Final    THIS SPECIMEN IS ACCEPTABLE. RESPIRATORY CULTURE REPORT TO FOLLOW.   Report Status 01/19/2015 FINAL  Final  Culture, respiratory (NON-Expectorated)     Status: None (Preliminary result)   Collection Time: 01/19/15  1:01 AM  Result Value Ref Range Status   Specimen Description EXPECTORATED SPUTUM  Final   Special Requests NONE  Final   Gram Stain PENDING  Incomplete   Culture   Final    Culture reincubated for better growth Performed at [facility]    Report Status PENDING  Incomplete  Culture, blood (routine x 2) Call MD if unable to obtain prior to antibiotics being given     Status: None (Preliminary result)   Collection Time: 01/19/15  6:07 AM  Result Value Ref Range Status   Specimen Description BLOOD LEFT HAND  Final   Special Requests IN PEDIATRIC BOTTLE 3MLS  Final   Culture NO GROWTH 1 DAY  Final   Report Status PENDING  Incomplete  Culture, blood (routine x 2) Call MD if unable to obtain prior to antibiotics being given     Status: None (Preliminary result)   Collection Time: 01/19/15  6:30 AM  Result Value Ref Range Status   Specimen Description BLOOD LEFT HAND  Final   Special Requests IN PEDIATRIC BOTTLE 3MLS  Final   Culture NO GROWTH 1 DAY  Final   Report Status PENDING  Incomplete    Medical History: Past Medical History  Diagnosis Date  . Adult ADHD      Assessment: 20 yo M presents on 1/16 with cough. Has had persistent cough over the last month. CXR showed RUL cavitary PNA and possible TB. CT of chest showed large multicavitary mass like area with bronchietctasis. Pharmacy consulted to dose voriconazole to rule out fungal etiology. LFTs and electrolytes wnl. SCr stable, CrCl > 168ml/min.   Goal of Therapy:  Resolution of infection  Plan:  Give voriconazole 540mg  IV Q12 x 2 Then start voriconazole 360mg  IV Q12 tomorrow Monitor  clinical picture, renal function, weekly LFTs, visual function F/U C&S, abx deescalation / LOT  Elenor Quinones, PharmD, BCPS Clinical Pharmacist Pager [phone] 01/20/2015 12:16 PM

## 2015-01-21 LAB — QUANTIFERON IN TUBE
QFT TB AG MINUS NIL VALUE: 0.01 IU/mL
QUANTIFERON MITOGEN VALUE: 0.25 [IU]/mL
QUANTIFERON NIL VALUE: 0.02 [IU]/mL
QUANTIFERON TB AG VALUE: 0.03 IU/mL
QUANTIFERON TB GOLD: UNDETERMINED

## 2015-01-21 LAB — CULTURE, RESPIRATORY

## 2015-01-21 LAB — CULTURE, RESPIRATORY W GRAM STAIN: Culture: NORMAL

## 2015-01-21 LAB — QUANTIFERON TB GOLD ASSAY (BLOOD)

## 2015-01-21 LAB — CULTURE, GROUP A STREP (THRC)

## 2015-01-21 LAB — MRSA PCR SCREENING: MRSA by PCR: NEGATIVE

## 2015-01-21 MED ORDER — VANCOMYCIN HCL 10 G IV SOLR
1500.0000 mg | Freq: Once | INTRAVENOUS | Status: AC
  Administered 2015-01-21: 1500 mg via INTRAVENOUS
  Filled 2015-01-21: qty 1500

## 2015-01-21 MED ORDER — SODIUM CHLORIDE 0.9 % IV SOLN
3.0000 g | Freq: Four times a day (QID) | INTRAVENOUS | Status: DC
  Administered 2015-01-21 – 2015-01-24 (×11): 3 g via INTRAVENOUS
  Filled 2015-01-21 (×14): qty 3

## 2015-01-21 MED ORDER — VANCOMYCIN HCL IN DEXTROSE 1-5 GM/200ML-% IV SOLN
1000.0000 mg | Freq: Three times a day (TID) | INTRAVENOUS | Status: DC
  Administered 2015-01-22 – 2015-01-23 (×5): 1000 mg via INTRAVENOUS
  Filled 2015-01-21 (×8): qty 200

## 2015-01-21 NOTE — Clinical Documentation Improvement (Signed)
Internal Medicine  Can the diagnosis of anemia be further specified? Please document response in next progress note; NOT in BPA drop down box. Thanks!   Iron deficiency Anemia  Nutritional anemia, including the nutrition or mineral deficits  Chronic Blood Loss Anemia, including the suspected or known cause  Acute Blood Loss Anemia  Anemia of chronic disease, including the associated chronic disease state  Other  Clinically Undetermined  Document any associated diagnoses/conditions.  Supporting Information:  H&H 11.9/36  Continue to monitor, recheck CBC in a.m - written daily in progress notes  Please exercise your independent, professional judgment when responding. A specific answer is not anticipated or expected.  Thank You,  Zoila Shutter RN, BSN, [place] [phone]; Cell: [phone]

## 2015-01-21 NOTE — Progress Notes (Signed)
ANTIBIOTIC CONSULT NOTE - INITIAL  Pharmacy Consult:  Vancomycin Indication:  PNA  Allergies  Allergen Reactions  . Bactrim Other (See Comments)    Unknown children reaction per pt's mom    Patient Measurements: Height: 5\' 9"  (175.3 cm) Weight: 197 lb 1.6 oz (89.404 kg) IBW/kg (Calculated) : 70.7  Vital Signs: Temp: 100.2 F (37.9 C) (01/19 0637) BP: 115/62 mmHg (01/19 0637) Pulse Rate: 95 (01/19 0637) Intake/Output from previous day: 01/18 0701 - 01/19 0700 In: 864 [P.O.:240; I.V.:624] Out: -  Intake/Output from this shift: Total I/O In: 240 [P.O.:240] Out: 1 [Urine:1]  Labs:  Recent Labs  01/18/15 2222  WBC 20.8*  HGB 11.9*  PLT 416*  CREATININE 0.83   Estimated Creatinine Clearance: 155.7 mL/min (by C-G formula based on Cr of 0.83). No results for input(s): VANCOTROUGH, VANCOPEAK, VANCORANDOM, GENTTROUGH, GENTPEAK, GENTRANDOM, TOBRATROUGH, TOBRAPEAK, TOBRARND, AMIKACINPEAK, AMIKACINTROU, AMIKACIN in the last 72 hours.   Microbiology: Recent Results (from the past 720 hour(s))  Culture, group A strep     Status: None   Collection Time: 01/18/15  5:44 PM  Result Value Ref Range Status   Specimen Description THROAT  Final   Special Requests NONE  Final   Culture NO GROUP A STREP (S.PYOGENES) ISOLATED  Final   Report Status 01/21/2015 FINAL  Final  Culture, expectorated sputum-assessment     Status: None   Collection Time: 01/18/15 11:15 PM  Result Value Ref Range Status   Specimen Description EXPECTORATED SPUTUM  Final   Special Requests NONE  Final   Sputum evaluation   Final    THIS SPECIMEN IS ACCEPTABLE. RESPIRATORY CULTURE REPORT TO FOLLOW.   Report Status 01/19/2015 FINAL  Final  Culture, respiratory (NON-Expectorated)     Status: None   Collection Time: 01/19/15  1:01 AM  Result Value Ref Range Status   Specimen Description EXPECTORATED SPUTUM  Final   Special Requests NONE  Final   Gram Stain   Final    RARE WBC PRESENT, PREDOMINANTLY  PMN RARE SQUAMOUS EPITHELIAL CELLS PRESENT MODERATE GRAM POSITIVE COCCI IN PAIRS IN CHAINS FEW GRAM NEGATIVE RODS Performed at [facility]    Culture   Final    NORMAL OROPHARYNGEAL FLORA Performed at [facility]    Report Status 01/21/2015 FINAL  Final  Culture, blood (routine x 2) Call MD if unable to obtain prior to antibiotics being given     Status: None (Preliminary result)   Collection Time: 01/19/15  6:07 AM  Result Value Ref Range Status   Specimen Description BLOOD LEFT HAND  Final   Special Requests IN PEDIATRIC BOTTLE 3MLS  Final   Culture NO GROWTH 1 DAY  Final   Report Status PENDING  Incomplete  Culture, blood (routine x 2) Call MD if unable to obtain prior to antibiotics being given     Status: None (Preliminary result)   Collection Time: 01/19/15  6:30 AM  Result Value Ref Range Status   Specimen Description BLOOD LEFT HAND  Final   Special Requests IN PEDIATRIC BOTTLE 3MLS  Final   Culture NO GROWTH 1 DAY  Final   Report Status PENDING  Incomplete  AFB culture with smear     Status: None (Preliminary result)   Collection Time: 01/19/15  8:59 PM  Result Value Ref Range Status   Specimen Description SPUTUM  Final   Special Requests NONE  Final   Acid Fast Smear   Final    NO ACID FAST BACILLI SEEN  Performed at [facility]   Final    CULTURE WILL BE EXAMINED FOR 6 WEEKS BEFORE ISSUING A FINAL REPORT Performed at [facility]    Report Status PENDING  Incomplete    Medical History: Past Medical History  Diagnosis Date  . Adult ADHD       Assessment: 19 YOM with cavitary lung mass on empiric ceftriaxone, azithromycin, voriconazole and now to add vancomycin.  Labs from 01/18/15 reviewed.  Vfend 1/18 >> Azith 1/18 >> (1/24) CTX 1/18 >> (1/24) Vanc 1/19 >>  1/16 sputum - negative 1/16 Group A Strep - negative 1/17 AFB cx -  1/17 sputum - negative 1/17 BCx x2 - NGTD 1/18 AFB cx -    Goal of Therapy:   Vancomycin trough level 15-20 mcg/ml   Plan:  - Vanc 1500mg  IV x 1, then 1gm IV Q8H - Vfend 360mg  IV Q12H - Continue Rocephin 1gm IV Q24H and azithromycin 500mg  IV Q24H per MD - BMET in AM   Lidia Clavijo D. Mina Marble, PharmD, BCPS Pager:  [phone] 01/21/2015, 1:54 PM

## 2015-01-21 NOTE — Progress Notes (Signed)
Name: Jeffery Crane MRN: [id number] DOB: 07-Feb-1995    ADMISSION DATE:  01/18/2015   CONSULTATION DATE: 01/19/15  REFERRING MD :  [facility]  Reason for consult:  Cough, fever and cavitary lesions on CXR  BRIEF PATIENT DESCRIPTION: Jeffery Crane is a 20 year old with a significant past medical history admitted with cough, fever. Found to have cavitary lesions and chest x-ray. Patient has no risk factors for TB or any other significant exposures.No significant travel history. No incarcerations. HIV is negative.  SIGNIFICANT EVENTS  ~1 month: productive cough with yellow-green sputum  01/16: Urgent care>abnormal CXR>Admitted with right upper lobe cavitary pneumonia and possible TB  STUDIES:  CT chest 01/17>> large multicavitary mass-like area in RUL with bronchiectasis, air fluid level, and mediastinal invasion. New from 07/11/2014. Mediastinal and hilar LAN.   SUBJECTIVE: Feeling same today. Wants to go home.  VITAL SIGNS: Temp:  [99.4 F (37.4 C)-100.2 F (37.9 C)] 100.2 F (37.9 C) (01/19 0637) Pulse Rate:  [87-95] 95 (01/19 0637) Resp:  [18] 18 (01/19 0637) BP: (115-120)/(60-76) 115/62 mmHg (01/19 0637) SpO2:  [98 %-100 %] 98 % (01/19 0637)  PHYSICAL EXAMINATION:  General:  NA Neuro: AAO X4; no focal deficits HEENT: PERRLA, dentition intact Cardiovascular: RRR, S1/S2 Lungs: Unlabored, CTAB Abdomen: soft, NT/ND, normal BS Musculoskeletal:  No deformities Skin: No rash   Recent Labs Lab 01/18/15 2222  NA 137  K 3.9  CL 101  CO2 25  BUN 7  CREATININE 0.83  GLUCOSE 100*    Recent Labs Lab 01/18/15 2222  HGB 11.9*  HCT 36.2*  WBC 20.8*  PLT 416*   Ct Chest W Contrast  01/19/2015  CLINICAL DATA:  Productive cough and hemoptysis for the past month, with night sweats. Radiographic findings suspicious for cavitary pneumonia on the right. EXAM: CT CHEST WITH CONTRAST TECHNIQUE: Multidetector CT imaging of the chest was performed during intravenous contrast  administration. CONTRAST:  53m OMNIPAQUE IOHEXOL 300 MG/ML  SOLN COMPARISON:  Chest radiographs, the most recent dated 01/18/2015 and 07/11/2014. FINDINGS: There is a triangular area of dense consolidation containing cylindrically dilated bronchi in the inferior, anterior aspect of the right upper lobe. This contains a thin walled cavitary component laterally containing an air-fluid level. This component measures 2.7 x 2.4 cm on image number 28. There is also a thick walled, rounded component with an irregular central cavitary component. This thick-walled component is located superiorly and anteriorly and measures 3.7 x 3.2 cm on image number 23. More inferiorly and medially, this has a solid component extending into the anterior mediastinum. This solid component measures 5.3 x 4.2 cm on image number 24. The cavitary and solid appearing component combined measure 8.7 x 6.6 cm in maximum diameter on image number 27, including extension into the mediastinum. This is causing mild compression and flattening of the anterolateral aspect of the superior vena cava. Also demonstrated are multiple enlarged mediastinal and right hilar lymph nodes. The largest node is a right paratracheal node with a short axis diameter of 2.8 cm on image 18. A right hilar node has a short axis diameter of 1.3 cm on image number 28. The left lung is clear. The upper abdomen is unremarkable. The bones appear normal. IMPRESSION: 1. Large, multi cavitary mass-like area in the anterior, medial aspect of the right upper lobe with associated cylindrical bronchiectasis, air-fluid level and mediastinal invasion. This was not present on 07/11/2014 and is most likely infectious in origin, with differential considerations including tuberculosis and fungal infection.  A rapidly growing, cavitary neoplasm is less likely. 2. Mediastinal and right hilar adenopathy. This could be reactive or metastatic. Electronically Signed   By: Claudie Revering M.D.   On:  01/19/2015 14:56    ASSESSMENT / PLAN:  BCx2 >>> Sputum 1/19 > nl flora  AFB > prelim no AFB seen >>> Urine Strep > Negative Urine Legionella >Negative Fungal Cx >>> Quant Gold >>> ESR > 131 CRP > 16.7 ANCA >>> ANA >>> RF > 12.8 wnl Flu PCR > Negative HIV > non reactive  20 YO AA male presenting with right upper lobe cavitary lesions. He has no risk factors for cavitary disease. His weight loss is voluntary, HIV serology negative and besides a productive cough, he does not have any other symptoms that will be suggestive of TB or malignancy. CT demonstrated multi-cavitary mass-like abnormality in RUL with mediastinal invasion.  Right upper lobe cavitary mass vs PNA with mediastinal invasion (GCP and rare GNR so far) R/o TB, fungal infection R/o malignancy   Plan -Cx as above blood, sputum, AFB, fungal -Quanteiferon tb gold assay -Continue empiric antibiotics (rocephin, azithro) -Continue Voriconazole anti-fungal coverage. Low threshold to dc  -ANCA and ANA pending -Droplet/airborne precautions -Will await culture and CT chest results before considering a bronchoscopy -PA/LAT CXR prior to [facility], AGACNP-BC [facility] Pager [phone] or [phone]  01/21/2015 10:20 AM

## 2015-01-21 NOTE — Progress Notes (Signed)
[facility] PROGRESS NOTE   Jeffery Crane:[id number] DOB: 11-09-1995 DOA: 01/18/2015 PCP: [facility]  HPI/Subjective: Still with productive cough. No f/c  Assessment/Plan: Principal Problem:   Cavitary lesion of lung Active Problems:   Leukocytosis   Thrombocytosis (HCC)   Anemia   CAP (community acquired pneumonia)   Cavitary lung disease   Cavitary pneumonia -Chest x-ray showing a cavitary pneumonia. Patient reports no previous travel or exposure to his knowledge. -Negative screening for HIV and influenza. -Check ESR, CRP, IGRA -AFB cultures neg x2 , QuantiFERON gold TB test is pending  -PCCM consulted, for bronch tomorrow. Seen by [facility] Started vancomycin. Change rocephin, azithro to unasyn for better anaerobic coverage. Will check MRSA PCR. If neg, can d/c vancomycin  Code Status: Full Code Family Communication: Plan discussed with the patient. Disposition Plan: Remains inpatient Diet: Diet Heart Room service appropriate?: Yes; Fluid consistency:: Thin Diet NPO time specified  Consultants:  PCCM.  CTS  Procedures:  None  Antibiotics:  Rocephin and azithromycin 01/19/15 - 1/19  Vanc, zosyn, voriconazole   Objective: Filed Vitals:   01/21/15 0637 01/21/15 1407  BP: 115/62 112/50  Pulse: 95 73  Temp: 100.2 F (37.9 C) 98.7 F (37.1 C)  Resp: 18 18    Intake/Output Summary (Last 24 hours) at 01/21/15 1611 Last data filed at 01/21/15 1300  Gross per 24 hour  Intake   1214 ml  Output      2 ml  Net   1212 ml   Filed Weights   01/18/15 1946  Weight: 89.404 kg (197 lb 1.6 oz)    Exam: General: Alert and awake, oriented x3, not in any acute distress. HEENT: normal dentition. MMM CVS: S1-S2 clear, no murmur rubs or gallops Chest: clear to auscultation bilaterally, no wheezing, rales or rhonchi Abdomen: soft nontender, nondistended, normal bowel sounds, no organomegaly Extremities: no cyanosis, clubbing or  edema noted bilaterally  Data Reviewed: Basic Metabolic Panel:  Recent Labs Lab 01/18/15 2222  NA 137  K 3.9  CL 101  CO2 25  GLUCOSE 100*  BUN 7  CREATININE 0.83  CALCIUM 8.9   Liver Function Tests:  Recent Labs Lab 01/18/15 2222  AST 16  ALT 24  ALKPHOS 117  BILITOT 0.2*  PROT 7.2  ALBUMIN 2.6*   No results for input(s): LIPASE, AMYLASE in the last 168 hours. No results for input(s): AMMONIA in the last 168 hours. CBC:  Recent Labs Lab 01/18/15 2222  WBC 20.8*  NEUTROABS 17.1*  HGB 11.9*  HCT 36.2*  MCV 81.7  PLT 416*   Cardiac Enzymes: No results for input(s): CKTOTAL, CKMB, CKMBINDEX, TROPONINI in the last 168 hours. BNP (last 3 results) No results for input(s): BNP in the last 8760 hours.  ProBNP (last 3 results) No results for input(s): PROBNP in the last 8760 hours.  CBG: No results for input(s): GLUCAP in the last 168 hours.  Micro Recent Results (from the past 240 hour(s))  Culture, group A strep     Status: None   Collection Time: 01/18/15  5:44 PM  Result Value Ref Range Status   Specimen Description THROAT  Final   Special Requests NONE  Final   Culture NO GROUP A STREP (S.PYOGENES) ISOLATED  Final   Report Status 01/21/2015 FINAL  Final  Culture, expectorated sputum-assessment     Status: None   Collection Time: 01/18/15 11:15 PM  Result Value Ref Range Status   Specimen Description EXPECTORATED SPUTUM  Final   Special Requests NONE  Final   Sputum evaluation   Final    THIS SPECIMEN IS ACCEPTABLE. RESPIRATORY CULTURE REPORT TO FOLLOW.   Report Status 01/19/2015 FINAL  Final  Culture, respiratory (NON-Expectorated)     Status: None   Collection Time: 01/19/15  1:01 AM  Result Value Ref Range Status   Specimen Description EXPECTORATED SPUTUM  Final   Special Requests NONE  Final   Gram Stain   Final    RARE WBC PRESENT, PREDOMINANTLY PMN RARE SQUAMOUS EPITHELIAL CELLS PRESENT MODERATE GRAM POSITIVE COCCI IN PAIRS IN CHAINS  FEW GRAM NEGATIVE RODS Performed at [facility]    Culture   Final    NORMAL OROPHARYNGEAL FLORA Performed at [facility]    Report Status 01/21/2015 FINAL  Final  Culture, blood (routine x 2) Call MD if unable to obtain prior to antibiotics being given     Status: None (Preliminary result)   Collection Time: 01/19/15  6:07 AM  Result Value Ref Range Status   Specimen Description BLOOD LEFT HAND  Final   Special Requests IN PEDIATRIC BOTTLE 3MLS  Final   Culture NO GROWTH 2 DAYS  Final   Report Status PENDING  Incomplete  Culture, blood (routine x 2) Call MD if unable to obtain prior to antibiotics being given     Status: None (Preliminary result)   Collection Time: 01/19/15  6:30 AM  Result Value Ref Range Status   Specimen Description BLOOD LEFT HAND  Final   Special Requests IN PEDIATRIC BOTTLE 3MLS  Final   Culture NO GROWTH 2 DAYS  Final   Report Status PENDING  Incomplete  AFB culture with smear     Status: None (Preliminary result)   Collection Time: 01/19/15  8:59 PM  Result Value Ref Range Status   Specimen Description SPUTUM  Final   Special Requests NONE  Final   Acid Fast Smear   Final    NO ACID FAST BACILLI SEEN Performed at [facility]    Culture   Final    CULTURE WILL BE EXAMINED FOR 6 WEEKS BEFORE ISSUING A FINAL REPORT Performed at [facility]    Report Status PENDING  Incomplete  AFB culture with smear     Status: None (Preliminary result)   Collection Time: 01/20/15  1:08 PM  Result Value Ref Range Status   Specimen Description SPUTUM  Final   Special Requests NONE  Final   Acid Fast Smear   Final    NO ACID FAST BACILLI SEEN Performed at [facility]    Culture   Final    CULTURE WILL BE EXAMINED FOR 6 WEEKS BEFORE ISSUING A FINAL REPORT Performed at [facility]    Report Status PENDING  Incomplete     Studies: No results found.  Scheduled Meds: . ampicillin-sulbactam (UNASYN) IV  3 g  Intravenous Q6H  . fluticasone  2 spray Each Nare Daily  . guaiFENesin  600 mg Oral BID  . heparin  5,000 Units Subcutaneous 3 times per day  . Influenza vac split quadrivalent PF  0.5 mL Intramuscular Tomorrow-1000  . pantoprazole  40 mg Oral Daily  . vancomycin  1,500 mg Intravenous Once  . [START ON 01/22/2015] vancomycin  1,000 mg Intravenous Q8H  . voriconazole  4 mg/kg Intravenous Q12H   Continuous Infusions: . sodium chloride 100 mL/hr at 01/21/15 0745    Time spent: 15 minutes  Janes Colegrove L  [facility] [email or web address], password [facility] 01/21/2015, 4:11 PM  LOS: 2 days

## 2015-01-22 ENCOUNTER — Inpatient Hospital Stay (HOSPITAL_COMMUNITY): Payer: Medicaid Other

## 2015-01-22 ENCOUNTER — Encounter (HOSPITAL_COMMUNITY)
Admission: EM | Disposition: A | Discharge status: Home / Self Care | Payer: Self-pay | Source: Home / Self Care | Attending: Internal Medicine

## 2015-01-22 HISTORY — PX: VIDEO BRONCHOSCOPY: SHX5072

## 2015-01-22 LAB — BODY FLUID CELL COUNT WITH DIFFERENTIAL
Eos, Fluid: 0 %
Lymphs, Fluid: 6 %
Monocyte-Macrophage-Serous Fluid: 85 % (ref 50–90)
Neutrophil Count, Fluid: 9 % (ref 0–25)
Total Nucleated Cell Count, Fluid: 440 cu mm (ref 0–1000)

## 2015-01-22 LAB — CBC
HEMATOCRIT: 33.9 % — AB (ref 39.0–52.0)
Hemoglobin: 10.7 g/dL — ABNORMAL LOW (ref 13.0–17.0)
MCH: 26.2 pg (ref 26.0–34.0)
MCHC: 31.6 g/dL (ref 30.0–36.0)
MCV: 82.9 fL (ref 78.0–100.0)
Platelets: 425 10*3/uL — ABNORMAL HIGH (ref 150–400)
RBC: 4.09 MIL/uL — ABNORMAL LOW (ref 4.22–5.81)
RDW: 14 % (ref 11.5–15.5)
WBC: 19.1 10*3/uL — ABNORMAL HIGH (ref 4.0–10.5)

## 2015-01-22 LAB — BASIC METABOLIC PANEL
ANION GAP: 8 (ref 5–15)
BUN: <5 mg/dL — ABNORMAL LOW (ref 6–20)
CALCIUM: 8.8 mg/dL — AB (ref 8.9–10.3)
CO2: 25 mmol/L (ref 22–32)
CREATININE: 0.87 mg/dL (ref 0.61–1.24)
Chloride: 106 mmol/L (ref 101–111)
Glucose, Bld: 105 mg/dL — ABNORMAL HIGH (ref 65–99)
Potassium: 3.7 mmol/L (ref 3.5–5.1)
SODIUM: 139 mmol/L (ref 135–145)

## 2015-01-22 LAB — PROTIME-INR
INR: 1.41 (ref 0.00–1.49)
PROTHROMBIN TIME: 17.3 s — AB (ref 11.6–15.2)

## 2015-01-22 LAB — APTT: APTT: 37 s (ref 24–37)

## 2015-01-22 SURGERY — BRONCHOSCOPY, WITH FLUOROSCOPY
Anesthesia: Moderate Sedation | Laterality: Bilateral

## 2015-01-22 MED ORDER — PHENYLEPHRINE HCL 0.25 % NA SOLN
NASAL | Status: DC | PRN
  Administered 2015-01-22: 2 via NASAL

## 2015-01-22 MED ORDER — MIDAZOLAM HCL 10 MG/2ML IJ SOLN
INTRAMUSCULAR | Status: DC | PRN
  Administered 2015-01-22 (×7): 1 mg via INTRAVENOUS

## 2015-01-22 MED ORDER — SODIUM CHLORIDE 0.9 % IV SOLN
INTRAVENOUS | Status: DC
  Administered 2015-01-22: 10:00:00 via INTRAVENOUS

## 2015-01-22 MED ORDER — MIDAZOLAM HCL 5 MG/ML IJ SOLN
INTRAMUSCULAR | Status: AC
  Filled 2015-01-22: qty 2

## 2015-01-22 MED ORDER — LIDOCAINE HCL (PF) 1 % IJ SOLN
INTRAMUSCULAR | Status: DC | PRN
  Administered 2015-01-22: 6 mL

## 2015-01-22 MED ORDER — FENTANYL CITRATE (PF) 100 MCG/2ML IJ SOLN
INTRAMUSCULAR | Status: AC
  Filled 2015-01-22: qty 4

## 2015-01-22 MED ORDER — FENTANYL CITRATE (PF) 100 MCG/2ML IJ SOLN
INTRAMUSCULAR | Status: DC | PRN
  Administered 2015-01-22 (×6): 25 ug via INTRAVENOUS

## 2015-01-22 MED ORDER — LIDOCAINE HCL 2 % EX GEL
CUTANEOUS | Status: DC | PRN
  Administered 2015-01-22: 1

## 2015-01-22 NOTE — Procedures (Signed)
PCCM Video Bronchoscopy Procedure Note  The patient was informed of the risks (including but not limited to bleeding, infection, respiratory failure, lung injury, tooth/oral injury) and benefits of the procedure and gave consent, see chart.  Indication: RUL pneumonia, abscess  Post Procedure Diagnosis: RUL pneumonia, abscess  Location: RUL  Condition pre procedure: Stable  Medications for procedure: 7 mg versed, 150 mcg fentanyl  Procedure description: The bronchoscope was introduced through the nose and passed to the bilateral lungs to the level of the subsegmental bronchi throughout the tracheobronchial tree.  Airway exam revealed: Normal mucosa  Procedures performed: Serial lavage performed in RUL apical and anterior segments with 60cc saline.   Specimens sent: Cultures, cell count, cytology  Condition post procedure: Stable  EBL: 0  Complications: None. Pt was hard to sedate and kept coughing. Large epiglottis >> made it difficult to pass the VCs.  Marshell Garfinkel MD [facility] Pager [phone] If no answer or after 3pm call: [phone] 01/22/2015, 10:41 AM

## 2015-01-22 NOTE — Progress Notes (Signed)
   Name: Jeffery Crane MRN: [id number] DOB: Dec 16, 1995    ADMISSION DATE:  01/18/2015   CONSULTATION DATE: 01/19/15  REFERRING MD :  [facility]  Reason for consult:  Cough, fever and cavitary lesions on CXR  BRIEF PATIENT DESCRIPTION: [name] is a 20 year old with a significant past medical history admitted with cough, fever. Found to have cavitary lesions and chest x-ray. Patient has no risk factors for TB or any other significant exposures.No significant travel history. No incarcerations. HIV is negative.  SIGNIFICANT EVENTS  ~1 month: productive cough with yellow-green sputum  01/16: Urgent care>abnormal CXR>Admitted with right upper lobe cavitary pneumonia and possible TB  STUDIES:  CT chest 01/17>> large multicavitary mass-like area in RUL with bronchiectasis, air fluid level, and mediastinal invasion. New from 07/11/2014. Mediastinal and hilar LAN.   SUBJECTIVE: Feeling well. Wants to go home.  VITAL SIGNS: Temp:  [98.5 F (36.9 C)-99.2 F (37.3 C)] 99.2 F (37.3 C) (01/20 0933) Pulse Rate:  [73-137] 119 (01/20 1040) Resp:  [11-31] 19 (01/20 1040) BP: (97-174)/(50-100) 99/70 mmHg (01/20 1035) SpO2:  [97 %-100 %] 99 % (01/20 1040) Weight:  [197 lb (89.359 kg)] 197 lb (89.359 kg) (01/20 0933)  PHYSICAL EXAMINATION:  General:  NA Neuro: No distress HEENT: PERRLA, Moist mucus membranes Cardiovascular: RRR, S1/S2 Lungs: Clear, No wheeze or crackles Abdomen: soft, NT, ND, + BS Musculoskeletal:  No deformities Skin: No rash   Recent Labs Lab 01/18/15 2222 01/22/15 0529  NA 137 139  K 3.9 3.7  CL 101 106  CO2 25 25  BUN 7 <5*  CREATININE 0.83 0.87  GLUCOSE 100* 105*    Recent Labs Lab 01/18/15 2222 01/22/15 0529  HGB 11.9* 10.7*  HCT 36.2* 33.9*  WBC 20.8* 19.1*  PLT 416* 425*   No results found.  ASSESSMENT / PLAN:  BCx2 >>> Sputum 1/19 > nl flora  AFB > prelim no AFB seen >>> Urine Strep > Negative Urine Legionella >Negative Fungal Cx  >>> Quant Gold >>> Indeterminate. ESR > 131 CRP > 16.7 ANCA >>> ANA >>> RF > 12.8 wnl Flu PCR > Negative HIV > non reactive  20 YO AA male presenting with right upper lobe cavitary lesions. He has no risk factors for cavitary disease. His weight loss is voluntary, HIV serology negative and besides a productive cough, he does not have any other symptoms that will be suggestive of TB or malignancy. CT demonstrated multi-cavitary mass-like abnormality in RUL with mediastinal invasion.  Right upper lobe cavitary mass vs PNA with mediastinal invasion. So far AFB X 2 negative. Oropharyngeal flora in sputum. Patient has no risk factors for TB or any other significant exposures. No significant travel history. HIV is negative. Findings likely secondary to bacterial infection, lung abscess.   Plan - ANCA and ANA pending. RF is negative. - Follow BAL results. If the smear does not show any AFB or GPCs then OK to send home on augmentin for total 4 weeks therapy. - He will need repeat CT scan after treatment and follow up with CT surgery and pulmonary. - We will arrange for the pulmonary follow up and CT scan.  Please call back with any questions.   Marshell Garfinkel MD [facility] Pager [phone] If no answer or after 3pm call: [phone] 01/22/2015, 10:48 AM

## 2015-01-22 NOTE — Progress Notes (Signed)
Patient ID: Jeffery Crane, male   DOB: Jun 20, 1995, 20 y.o.   MRN: [id number]       [address]       [name] [address]             [phone]      C/o sore throat post bronch  BP 107/57 mmHg  Pulse 101  Temp(Src) 99.2 F (37.3 C) (Oral)  Resp 20  Ht 5\' 9"  (1.753 m)  Wt 197 lb (89.359 kg)  BMI 29.08 kg/m2  SpO2 96%  Findings noted. Results pending  Will need repeat CT in 4-6 weeks   Remo Lipps C. Roxan Hockey, MD [facility] [phone]

## 2015-01-22 NOTE — Progress Notes (Signed)
Video bronchoscopy performed.  Intervention bronchial washing.  No complications noted.  Patient tolerated well. 

## 2015-01-22 NOTE — Progress Notes (Signed)
[facility] PROGRESS NOTE   Jeffery Crane LNZ:[id number] DOB: 1995-05-30 DOA: 01/18/2015 PCP: [facility]  HPI/Subjective: Fiberoptic bronchoscopy done earlier today, per pulmonology if BL negative for AFB and CBC can be discharged on Augmentin for total of 4 weeks. Denies any new symptoms, the hemoptysis is improving.  Assessment/Plan: Principal Problem:   Cavitary lesion of lung Active Problems:   Leukocytosis   Thrombocytosis (HCC)   Anemia   CAP (community acquired pneumonia)   Cavitary lung disease   Cavitary lesion of lung -Chest x-ray showing a cavitary pneumonia. Patient reports no previous travel or exposure to his knowledge. -Negative screening for HIV and influenza. -ESR is 131 -QuantiFERON gold TB test is indeterminate -FOBT done, await for smear for AFB and GPC if both negative can be discharged on Augmentin per pulmonology.  Possible community-acquired pneumonia:  -On Unasyn, likely necrotizing pneumonia.  Leukocytosis: Acute. WBC elevated at 20.8 on admission - Continue to monitor, recheck CBC in a.m.  Thrombocytosis (HCC) -Platelet count elevated at 416. Suspect likely reactive to above. -Continue to monitor  Anemia: Acute Hemoglobin 11.9 with low limit of normal MCV and MCH. - Follow-up CBC    Code Status: Full Code Family Communication: Plan discussed with the patient. Disposition Plan: Remains inpatient Diet: Diet Heart Room service appropriate?: Yes; Fluid consistency:: Thin  Consultants:  PCCM.  CTS  Procedures:  None  Antibiotics:  Rocephin and azithromycin started on 01/19/15   Objective: Filed Vitals:   01/22/15 1050 01/22/15 1122  BP: 109/60 107/57  Pulse: 116 101  Temp:  99.2 F (37.3 C)  Resp: 26 20    Intake/Output Summary (Last 24 hours) at 01/22/15 1534 Last data filed at 01/22/15 1053  Gross per 24 hour  Intake 2393.33 ml  Output      0 ml  Net 2393.33 ml   Filed Weights     01/18/15 1946 01/22/15 0933  Weight: 89.404 kg (197 lb 1.6 oz) 89.359 kg (197 lb)    Exam: General: Alert and awake, oriented x3, not in any acute distress. HEENT: anicteric sclera, pupils reactive to light and accommodation, EOMI CVS: S1-S2 clear, no murmur rubs or gallops Chest: clear to auscultation bilaterally, no wheezing, rales or rhonchi Abdomen: soft nontender, nondistended, normal bowel sounds, no organomegaly Extremities: no cyanosis, clubbing or edema noted bilaterally Neuro: Cranial nerves II-XII intact, no focal neurological deficits  Data Reviewed: Basic Metabolic Panel:  Recent Labs Lab 01/18/15 2222 01/22/15 0529  NA 137 139  K 3.9 3.7  CL 101 106  CO2 25 25  GLUCOSE 100* 105*  BUN 7 <5*  CREATININE 0.83 0.87  CALCIUM 8.9 8.8*   Liver Function Tests:  Recent Labs Lab 01/18/15 2222  AST 16  ALT 24  ALKPHOS 117  BILITOT 0.2*  PROT 7.2  ALBUMIN 2.6*   No results for input(s): LIPASE, AMYLASE in the last 168 hours. No results for input(s): AMMONIA in the last 168 hours. CBC:  Recent Labs Lab 01/18/15 2222 01/22/15 0529  WBC 20.8* 19.1*  NEUTROABS 17.1*  --   HGB 11.9* 10.7*  HCT 36.2* 33.9*  MCV 81.7 82.9  PLT 416* 425*   Cardiac Enzymes: No results for input(s): CKTOTAL, CKMB, CKMBINDEX, TROPONINI in the last 168 hours. BNP (last 3 results) No results for input(s): BNP in the last 8760 hours.  ProBNP (last 3 results) No results for input(s): PROBNP in the last 8760 hours.  CBG: No results for input(s): GLUCAP in the  last 168 hours.  Micro Recent Results (from the past 240 hour(s))  Culture, group A strep     Status: None   Collection Time: 01/18/15  5:44 PM  Result Value Ref Range Status   Specimen Description THROAT  Final   Special Requests NONE  Final   Culture NO GROUP A STREP (S.PYOGENES) ISOLATED  Final   Report Status 01/21/2015 FINAL  Final  Culture, expectorated sputum-assessment     Status: None   Collection Time:  01/18/15 11:15 PM  Result Value Ref Range Status   Specimen Description EXPECTORATED SPUTUM  Final   Special Requests NONE  Final   Sputum evaluation   Final    THIS SPECIMEN IS ACCEPTABLE. RESPIRATORY CULTURE REPORT TO FOLLOW.   Report Status 01/19/2015 FINAL  Final  Culture, respiratory (NON-Expectorated)     Status: None   Collection Time: 01/19/15  1:01 AM  Result Value Ref Range Status   Specimen Description EXPECTORATED SPUTUM  Final   Special Requests NONE  Final   Gram Stain   Final    RARE WBC PRESENT, PREDOMINANTLY PMN RARE SQUAMOUS EPITHELIAL CELLS PRESENT MODERATE GRAM POSITIVE COCCI IN PAIRS IN CHAINS FEW GRAM NEGATIVE RODS Performed at [facility]    Culture   Final    NORMAL OROPHARYNGEAL FLORA Performed at [facility]    Report Status 01/21/2015 FINAL  Final  Culture, blood (routine x 2) Call MD if unable to obtain prior to antibiotics being given     Status: None (Preliminary result)   Collection Time: 01/19/15  6:07 AM  Result Value Ref Range Status   Specimen Description BLOOD LEFT HAND  Final   Special Requests IN PEDIATRIC BOTTLE 3MLS  Final   Culture NO GROWTH 3 DAYS  Final   Report Status PENDING  Incomplete  Culture, blood (routine x 2) Call MD if unable to obtain prior to antibiotics being given     Status: None (Preliminary result)   Collection Time: 01/19/15  6:30 AM  Result Value Ref Range Status   Specimen Description BLOOD LEFT HAND  Final   Special Requests IN PEDIATRIC BOTTLE 3MLS  Final   Culture NO GROWTH 3 DAYS  Final   Report Status PENDING  Incomplete  AFB culture with smear     Status: None (Preliminary result)   Collection Time: 01/19/15  8:59 PM  Result Value Ref Range Status   Specimen Description SPUTUM  Final   Special Requests NONE  Final   Acid Fast Smear   Final    NO ACID FAST BACILLI SEEN Performed at [facility]    Culture   Final    CULTURE WILL BE EXAMINED FOR 6 WEEKS BEFORE ISSUING A FINAL  REPORT Performed at [facility]    Report Status PENDING  Incomplete  AFB culture with smear     Status: None (Preliminary result)   Collection Time: 01/20/15  1:08 PM  Result Value Ref Range Status   Specimen Description SPUTUM  Final   Special Requests NONE  Final   Acid Fast Smear   Final    NO ACID FAST BACILLI SEEN Performed at [facility]    Culture   Final    CULTURE WILL BE EXAMINED FOR 6 WEEKS BEFORE ISSUING A FINAL REPORT Performed at [facility]    Report Status PENDING  Incomplete  MRSA PCR Screening     Status: None   Collection Time: 01/21/15  6:35 PM  Result Value Ref Range Status   MRSA by PCR NEGATIVE NEGATIVE Final    Comment:        The GeneXpert MRSA Assay (FDA approved for NASAL specimens only), is one component of a comprehensive MRSA colonization surveillance program. It is not intended to diagnose MRSA infection nor to guide or monitor treatment for MRSA infections.      Studies: No results found.  Scheduled Meds: . ampicillin-sulbactam (UNASYN) IV  3 g Intravenous Q6H  . fluticasone  2 spray Each Nare Daily  . guaiFENesin  600 mg Oral BID  . pantoprazole  40 mg Oral Daily  . vancomycin  1,000 mg Intravenous Q8H  . voriconazole  4 mg/kg Intravenous Q12H   Continuous Infusions: . sodium chloride 10 mL/hr at 01/21/15 2216  . sodium chloride Stopped (01/22/15 1054)       Time spent: 35 minutes    [facility]  [facility] Pager [phone] If 7PM-7AM, please contact night-coverage at [email or web address], password [facility] 01/22/2015, 3:34 PM  LOS: 3 days

## 2015-01-23 DIAGNOSIS — R590 Localized enlarged lymph nodes: Secondary | ICD-10-CM

## 2015-01-23 DIAGNOSIS — R509 Fever, unspecified: Secondary | ICD-10-CM

## 2015-01-23 LAB — BASIC METABOLIC PANEL
ANION GAP: 9 (ref 5–15)
CO2: 28 mmol/L (ref 22–32)
Calcium: 8.9 mg/dL (ref 8.9–10.3)
Chloride: 102 mmol/L (ref 101–111)
Creatinine, Ser: 0.85 mg/dL (ref 0.61–1.24)
GFR calc Af Amer: >60 mL/min (ref >60)
GFR calc non Af Amer: >60 mL/min (ref >60)
GLUCOSE: 93 mg/dL (ref 65–99)
POTASSIUM: 3.7 mmol/L (ref 3.5–5.1)
Sodium: 139 mmol/L (ref 135–145)

## 2015-01-23 LAB — CBC
HEMATOCRIT: 33.9 % — AB (ref 39.0–52.0)
HEMOGLOBIN: 11.1 g/dL — AB (ref 13.0–17.0)
MCH: 26.9 pg (ref 26.0–34.0)
MCHC: 32.7 g/dL (ref 30.0–36.0)
MCV: 82.1 fL (ref 78.0–100.0)
Platelets: 428 10*3/uL — ABNORMAL HIGH (ref 150–400)
RBC: 4.13 MIL/uL — ABNORMAL LOW (ref 4.22–5.81)
RDW: 14.1 % (ref 11.5–15.5)
WBC: 21.5 10*3/uL — ABNORMAL HIGH (ref 4.0–10.5)

## 2015-01-23 MED ORDER — ACETAMINOPHEN 325 MG PO TABS
650.0000 mg | ORAL_TABLET | Freq: Four times a day (QID) | ORAL | Status: DC | PRN
  Administered 2015-01-23: 650 mg via ORAL
  Filled 2015-01-23: qty 2

## 2015-01-23 NOTE — Progress Notes (Signed)
[facility] PROGRESS NOTE   Jeffery Crane IOM:[id number] DOB: 25-Apr-1995 DOA: 01/18/2015 PCP: [facility]  HPI/Subjective: Developed fever of 100.9 this morning, does not look toxic, his WBC increasing his 21.5 this morning.  on Unasyn, vancomycin and voriconazole I'll ask ID to comment on his antibiotics.   Assessment/Plan: Principal Problem:   Cavitary lesion of lung Active Problems:   Leukocytosis   Thrombocytosis (HCC)   Anemia   CAP (community acquired pneumonia)   Cavitary lung disease   Cavitary lesion of lung -Chest x-ray showing a cavitary pneumonia. Patient reports no previous travel or exposure to his knowledge. -Negative screening for HIV and influenza. -ESR is 131 -QuantiFERON gold TB test is indeterminate. -On vancomycin, Unasyn and voriconazole. -FOBT done, await for smear for AFB and GPC if both negative can be discharged on Augmentin per pulmonology. -Developed a fever, currently on Unasyn/vanc, ID to evaluate.   Possible community-acquired pneumonia:  -On Unasyn/Vanc, likely necrotizing pneumonia.  Leukocytosis: Acute. WBC elevated at 20.8 on admission - Continue to monitor, recheck CBC in a.m.  Thrombocytosis (HCC) -Platelet count elevated at 416. Suspect likely reactive to above. -Continue to monitor  Anemia: Acute Hemoglobin 11.9 with low limit of normal MCV and MCH. - Follow-up CBC    Code Status: Full Code Family Communication: Plan discussed with the patient. Disposition Plan: Remains inpatient Diet: Diet Heart Room service appropriate?: Yes; Fluid consistency:: Thin  Consultants:  PCCM.  CTS  Procedures:  None  Antibiotics:  Rocephin and azithromycin started on 01/19/15   Objective: Filed Vitals:   01/22/15 2130 01/23/15 0504  BP:  113/60  Pulse:  94  Temp: 99.5 F (37.5 C) 100.9 F (38.3 C)  Resp:  19    Intake/Output Summary (Last 24 hours) at 01/23/15 1349 Last data filed at  01/23/15 0500  Gross per 24 hour  Intake   1000 ml  Output      0 ml  Net   1000 ml   Filed Weights   01/18/15 1946 01/22/15 0933  Weight: 89.404 kg (197 lb 1.6 oz) 89.359 kg (197 lb)    Exam: General: Alert and awake, oriented x3, not in any acute distress. HEENT: anicteric sclera, pupils reactive to light and accommodation, EOMI CVS: S1-S2 clear, no murmur rubs or gallops Chest: clear to auscultation bilaterally, no wheezing, rales or rhonchi Abdomen: soft nontender, nondistended, normal bowel sounds, no organomegaly Extremities: no cyanosis, clubbing or edema noted bilaterally Neuro: Cranial nerves II-XII intact, no focal neurological deficits  Data Reviewed: Basic Metabolic Panel:  Recent Labs Lab 01/18/15 2222 01/22/15 0529 01/23/15 0620  NA 137 139 139  K 3.9 3.7 3.7  CL 101 106 102  CO2 '25 25 28  ' GLUCOSE 100* 105* 93  BUN 7 <5* <5*  CREATININE 0.83 0.87 0.85  CALCIUM 8.9 8.8* 8.9   Liver Function Tests:  Recent Labs Lab 01/18/15 2222  AST 16  ALT 24  ALKPHOS 117  BILITOT 0.2*  PROT 7.2  ALBUMIN 2.6*   No results for input(s): LIPASE, AMYLASE in the last 168 hours. No results for input(s): AMMONIA in the last 168 hours. CBC:  Recent Labs Lab 01/18/15 2222 01/22/15 0529 01/23/15 0620  WBC 20.8* 19.1* 21.5*  NEUTROABS 17.1*  --   --   HGB 11.9* 10.7* 11.1*  HCT 36.2* 33.9* 33.9*  MCV 81.7 82.9 82.1  PLT 416* 425* 428*   Cardiac Enzymes: No results for input(s): CKTOTAL, CKMB, CKMBINDEX, TROPONINI in the  last 168 hours. BNP (last 3 results) No results for input(s): BNP in the last 8760 hours.  ProBNP (last 3 results) No results for input(s): PROBNP in the last 8760 hours.  CBG: No results for input(s): GLUCAP in the last 168 hours.  Micro Recent Results (from the past 240 hour(s))  Culture, group A strep     Status: None   Collection Time: 01/18/15  5:44 PM  Result Value Ref Range Status   Specimen Description THROAT  Final    Special Requests NONE  Final   Culture NO GROUP A STREP (S.PYOGENES) ISOLATED  Final   Report Status 01/21/2015 FINAL  Final  Culture, expectorated sputum-assessment     Status: None   Collection Time: 01/18/15 11:15 PM  Result Value Ref Range Status   Specimen Description EXPECTORATED SPUTUM  Final   Special Requests NONE  Final   Sputum evaluation   Final    THIS SPECIMEN IS ACCEPTABLE. RESPIRATORY CULTURE REPORT TO FOLLOW.   Report Status 01/19/2015 FINAL  Final  Culture, respiratory (NON-Expectorated)     Status: None   Collection Time: 01/19/15  1:01 AM  Result Value Ref Range Status   Specimen Description EXPECTORATED SPUTUM  Final   Special Requests NONE  Final   Gram Stain   Final    RARE WBC PRESENT, PREDOMINANTLY PMN RARE SQUAMOUS EPITHELIAL CELLS PRESENT MODERATE GRAM POSITIVE COCCI IN PAIRS IN CHAINS FEW GRAM NEGATIVE RODS Performed at [facility]    Culture   Final    NORMAL OROPHARYNGEAL FLORA Performed at [facility]    Report Status 01/21/2015 FINAL  Final  Culture, blood (routine x 2) Call MD if unable to obtain prior to antibiotics being given     Status: None (Preliminary result)   Collection Time: 01/19/15  6:07 AM  Result Value Ref Range Status   Specimen Description BLOOD LEFT HAND  Final   Special Requests IN PEDIATRIC BOTTLE 3MLS  Final   Culture NO GROWTH 4 DAYS  Final   Report Status PENDING  Incomplete  Culture, blood (routine x 2) Call MD if unable to obtain prior to antibiotics being given     Status: None (Preliminary result)   Collection Time: 01/19/15  6:30 AM  Result Value Ref Range Status   Specimen Description BLOOD LEFT HAND  Final   Special Requests IN PEDIATRIC BOTTLE 3MLS  Final   Culture NO GROWTH 4 DAYS  Final   Report Status PENDING  Incomplete  AFB culture with smear     Status: None (Preliminary result)   Collection Time: 01/19/15  8:59 PM  Result Value Ref Range Status   Specimen Description SPUTUM  Final    Special Requests NONE  Final   Acid Fast Smear   Final    NO ACID FAST BACILLI SEEN Performed at [facility]    Culture   Final    CULTURE WILL BE EXAMINED FOR 6 WEEKS BEFORE ISSUING A FINAL REPORT Performed at [facility]    Report Status PENDING  Incomplete  AFB culture with smear     Status: None (Preliminary result)   Collection Time: 01/20/15  1:08 PM  Result Value Ref Range Status   Specimen Description SPUTUM  Final   Special Requests NONE  Final   Acid Fast Smear   Final    NO ACID FAST BACILLI SEEN Performed at [facility]    Culture   Final    CULTURE WILL  BE EXAMINED FOR 6 WEEKS BEFORE ISSUING A FINAL REPORT Performed at [facility]    Report Status PENDING  Incomplete  MRSA PCR Screening     Status: None   Collection Time: 01/21/15  6:35 PM  Result Value Ref Range Status   MRSA by PCR NEGATIVE NEGATIVE Final    Comment:        The GeneXpert MRSA Assay (FDA approved for NASAL specimens only), is one component of a comprehensive MRSA colonization surveillance program. It is not intended to diagnose MRSA infection nor to guide or monitor treatment for MRSA infections.   AFB culture with smear     Status: None (Preliminary result)   Collection Time: 01/22/15 10:25 AM  Result Value Ref Range Status   Specimen Description BRONCHIAL ALVEOLAR LAVAGE  Final   Special Requests Normal  Final   Acid Fast Smear   Final    NO ACID FAST BACILLI SEEN Performed at [facility]    Culture   Final    CULTURE WILL BE EXAMINED FOR 6 WEEKS BEFORE ISSUING A FINAL REPORT Performed at [facility]    Report Status PENDING  Incomplete  Fungus Culture with Smear     Status: None (Preliminary result)   Collection Time: 01/22/15 10:25 AM  Result Value Ref Range Status   Specimen Description BRONCHIAL ALVEOLAR LAVAGE  Final   Special Requests Normal  Final   Fungal Smear   Final    NO YEAST OR FUNGAL ELEMENTS SEEN Performed  at [facility]    Culture   Final    CULTURE IN PROGRESS FOR FOUR WEEKS Performed at [facility]    Report Status PENDING  Incomplete     Studies: No results found.  Scheduled Meds: . ampicillin-sulbactam (UNASYN) IV  3 g Intravenous Q6H  . fluticasone  2 spray Each Nare Daily  . guaiFENesin  600 mg Oral BID  . pantoprazole  40 mg Oral Daily  . vancomycin  1,000 mg Intravenous Q8H  . voriconazole  4 mg/kg Intravenous Q12H   Continuous Infusions: . sodium chloride 10 mL/hr at 01/21/15 2216  . sodium chloride Stopped (01/22/15 1054)       Time spent: 35 minutes    [facility] A  [facility] Pager [phone] If 7PM-7AM, please contact night-coverage at [email or web address], password [facility] 01/23/2015, 1:49 PM  LOS: 4 days

## 2015-01-23 NOTE — Progress Notes (Signed)
      [address]       [place] [address]             [phone]      He is unhappy that he is still in the hospital Says he feels "OK". Had a small amount of hemoptysis post bronch but cough is improved He does not appear toxic but had a temp of 100.9 this AM Lungs clear on exam Findings of bronch noted Unclear why fever is worsening. He is on broad spectrum antimicrobial coverage and antifungal coverage as well If fevers persist may need to [facility] C. Roxan Hockey, MD [facility] [phone]

## 2015-01-23 NOTE — Consult Note (Addendum)
[name]  Date of Admission:  01/18/2015  Date of Consult:  01/23/2015  Reason for Consult: cavitary pneumonia Referring Physician: [place] Poli  Impression/Recommendation Cavitary Pneumonia Fever LAN R neck/[place] 5 fossa  Would Stop isolation Stop vancomycin Stop voriconazole check fungal ab panel Consider transition to by mouth Augmentin Watch temperatures Agree with CVTS, repeat CT scan in 4-6 weeks after d/c.  Anticipate discharge soon  Thank you so much for this interesting consult,   Shawna Rumpf (pager) [phone] [email or web address]  Jeffery Crane is an 20 y.o. male.  HPI:   Male with no past medical history. Comes to hospital on January 17 with one month of cough. He states over the preceding 3 weeks he had productive sputum, which was green. He had hemoptysis only recently after his procedure. He has had weight loss during this., Which he states is intentional. He denies any lymphadenopathy.  States that he did have fevers at home. He did not have night sweats. He did not have shortness of breath or dyspnea on exertion. His white blood cell count on admission was 20.8, he was afebrile, and his chest x-ray showed a cavitary right upper lobe pneumonia with associated right hilar and right paratracheal lymphadenopathy. He had CT scan as well that showed: 1. Large, multi cavitary mass-like area in the anterior, medial aspect of the right upper lobe with associated cylindrical bronchiectasis, air-fluid level and mediastinal invasion. This was not present on 07/11/2014 and is most likely infectious in origin, with differential considerations including tuberculosis and fungal infection. A rapidly growing, cavitary neoplasm is less likely. 2. Mediastinal and right hilar adenopathy. This could be reactive or metastatic. He denies sick contacts.  He denies TB exposures He denies incarceration or homelessness He did not overseas travel  He  works as a Geophysical data processor  He was started on ceftriaxone and azithromycin. This was changed on 1-19 to vanco/unasyn. He underwent bronchoscopy on 1-20. His BAL was negative for yeast, AFB. He also had 2 negative AFB sputums. This a.m. his blood cell count was 21.5 and his temperature was 100.9.   Past Medical History  Diagnosis Date  . Adult ADHD     History reviewed. No pertinent past surgical history.   Allergies  Allergen Reactions  . Bactrim Other (See Comments)    Unknown children reaction per pt's mom    Medications:  Scheduled: . ampicillin-sulbactam (UNASYN) IV  3 g Intravenous Q6H  . fluticasone  2 spray Each Nare Daily  . guaiFENesin  600 mg Oral BID  . pantoprazole  40 mg Oral Daily  . vancomycin  1,000 mg Intravenous Q8H  . voriconazole  4 mg/kg Intravenous Q12H    Abtx:  Anti-infectives    Start     Dose/Rate Route Frequency Ordered Stop   01/22/15 0000  vancomycin (VANCOCIN) IVPB 1000 mg/200 mL premix     1,000 mg 200 mL/hr over 60 Minutes Intravenous Every 8 hours 01/21/15 1355     01/21/15 1700  Ampicillin-Sulbactam (UNASYN) 3 g in sodium chloride 0.9 % 100 mL IVPB     3 g 100 mL/hr over 60 Minutes Intravenous Every 6 hours 01/21/15 1611     01/21/15 1400  vancomycin (VANCOCIN) 1,500 mg in sodium chloride 0.9 % 500 mL IVPB     1,500 mg 250 mL/hr over 120 Minutes Intravenous  Once 01/21/15 1355 01/21/15 1920   01/21/15 1300  voriconazole (VFEND) 360 mg in sodium chloride 0.9 % 100 mL IVPB  4 mg/kg  89.4 kg 68 mL/hr over 120 Minutes Intravenous Every 12 hours 01/20/15 1215     01/20/15 1300  voriconazole (VFEND) 540 mg in sodium chloride 0.9 % 150 mL IVPB     6 mg/kg  89.4 kg 102 mL/hr over 120 Minutes Intravenous Every 12 hours 01/20/15 1215 01/21/15 0438   01/19/15 2200  azithromycin (ZITHROMAX) 500 mg in dextrose 5 % 250 mL IVPB  Status:  Discontinued     500 mg 250 mL/hr over 60 Minutes Intravenous Every 24 hours 01/19/15 0453 01/21/15  1610   01/19/15 2200  cefTRIAXone (ROCEPHIN) 1 g in dextrose 5 % 50 mL IVPB  Status:  Discontinued     1 g 100 mL/hr over 30 Minutes Intravenous Every 24 hours 01/19/15 0455 01/21/15 1610   01/19/15 0452  cefTRIAXone (ROCEPHIN) 1 g in dextrose 5 % 50 mL IVPB  Status:  Discontinued     1 g 100 mL/hr over 30 Minutes Intravenous Every 24 hours 01/19/15 0453 01/19/15 0455   01/19/15 0415  cefTRIAXone (ROCEPHIN) 1 g in dextrose 5 % 50 mL IVPB  Status:  Discontinued     1 g 100 mL/hr over 30 Minutes Intravenous Every 24 hours 01/19/15 0402 01/19/15 0453   01/19/15 0415  azithromycin (ZITHROMAX) 500 mg in dextrose 5 % 250 mL IVPB  Status:  Discontinued     500 mg 250 mL/hr over 60 Minutes Intravenous Every 24 hours 01/19/15 0402 01/19/15 0453   01/18/15 2215  cefTRIAXone (ROCEPHIN) 1 g in dextrose 5 % 50 mL IVPB     1 g 100 mL/hr over 30 Minutes Intravenous  Once 01/18/15 2204 01/18/15 2350   01/18/15 2215  azithromycin (ZITHROMAX) 500 mg in dextrose 5 % 250 mL IVPB     500 mg 250 mL/hr over 60 Minutes Intravenous  Once 01/18/15 2204 01/19/15 0115      Total days of antibiotics: 3 (vanco/unasyn/voriconazole)          Social History:  reports that he has never smoked. He does not have any smokeless tobacco history on file. He reports that he drinks alcohol. He reports that he does not use illicit drugs.  No family history on file.  Parents healthy, father recently in [place]. Brother with ADD  General ROS: Nl BM and urine. 12 point ROS (-), see HPI.   Blood pressure 105/62, pulse 100, temperature 98.9 F (37.2 C), temperature source Oral, resp. rate 18, height _0  (1.753 m), weight 89.359 kg (197 lb), SpO2 98 %. General appearance: alert, cooperative and no distress Eyes: negative findings: conjunctivae and sclerae normal and pupils equal, round, reactive to light and accomodation Throat: lips, mucosa, and tongue normal; teeth and gums normal Neck: supple, symmetrical, trachea  midline Lungs: diminished breath sounds anterior - right Heart: regular rate and rhythm Abdomen: normal findings: bowel sounds normal and soft, non-tender Extremities: edema nonoe Lymph nodes: Cervical adenopathy: no anterior, Axillary adenopathy: none and Supraclavicular adenopathy: very tender, enlarged LN in R [place] fossa, adjacent to neck.    Results for orders placed or performed during the hospital encounter of 01/18/15 (from the past 48 hour(s))  MRSA PCR Screening     Status: None   Collection Time: 01/21/15  6:35 PM  Result Value Ref Range   MRSA by PCR NEGATIVE NEGATIVE    Comment:        The GeneXpert MRSA Assay (FDA approved for NASAL specimens only), is one component of a comprehensive MRSA colonization  surveillance program. It is not intended to diagnose MRSA infection nor to guide or monitor treatment for MRSA infections.   CBC     Status: Abnormal   Collection Time: 01/22/15  5:29 AM  Result Value Ref Range   WBC 19.1 (H) 4.0 - 10.5 K/uL   RBC 4.09 (L) 4.22 - 5.81 MIL/uL   Hemoglobin 10.7 (L) 13.0 - 17.0 g/dL   HCT 70.8 (L) 33.2 - 73.8 %   MCV 82.9 78.0 - 100.0 fL   MCH 26.2 26.0 - 34.0 pg   MCHC 31.6 30.0 - 36.0 g/dL   RDW 09.4 70.1 - 10.4 %   Platelets 425 (H) 150 - 400 K/uL  Protime-INR     Status: Abnormal   Collection Time: 01/22/15  5:29 AM  Result Value Ref Range   Prothrombin Time 17.3 (H) 11.6 - 15.2 seconds   INR 1.41 0.00 - 1.49  APTT     Status: None   Collection Time: 01/22/15  5:29 AM  Result Value Ref Range   aPTT 37 24 - 37 seconds    Comment:        IF BASELINE aPTT IS ELEVATED, SUGGEST PATIENT RISK ASSESSMENT BE USED TO DETERMINE APPROPRIATE ANTICOAGULANT THERAPY.   Basic metabolic panel     Status: Abnormal   Collection Time: 01/22/15  5:29 AM  Result Value Ref Range   Sodium 139 135 - 145 mmol/L   Potassium 3.7 3.5 - 5.1 mmol/L   Chloride 106 101 - 111 mmol/L   CO2 25 22 - 32 mmol/L   Glucose, Bld 105 (H) 65 - 99 mg/dL   BUN  <5 (L) 6 - 20 mg/dL   Creatinine, Ser 3.81 0.61 - 1.24 mg/dL   Calcium 8.8 (L) 8.9 - 10.3 mg/dL   GFR calc non Af Amer >60 >60 mL/min   GFR calc Af Amer >60 >60 mL/min    Comment: (NOTE) The eGFR has been calculated using the CKD EPI equation. This calculation has not been validated in all clinical situations. eGFR's persistently <60 mL/min signify possible Chronic Kidney Disease.    Anion gap 8 5 - 15  AFB culture with smear     Status: None (Preliminary result)   Collection Time: 01/22/15 10:25 AM  Result Value Ref Range   Specimen Description BRONCHIAL ALVEOLAR LAVAGE    Special Requests Normal    Acid Fast Smear      NO ACID FAST BACILLI SEEN Performed at [facility]    Culture      CULTURE WILL BE EXAMINED FOR 6 WEEKS BEFORE ISSUING A FINAL REPORT Performed at [facility]    Report Status PENDING   Fungus Culture with Smear     Status: None (Preliminary result)   Collection Time: 01/22/15 10:25 AM  Result Value Ref Range   Specimen Description BRONCHIAL ALVEOLAR LAVAGE    Special Requests Normal    Fungal Smear      NO YEAST OR FUNGAL ELEMENTS SEEN Performed at [facility]    Culture      CULTURE IN PROGRESS FOR FOUR WEEKS Performed at [facility]    Report Status PENDING   Body fluid cell count with differential     Status: Abnormal   Collection Time: 01/22/15 10:25 AM  Result Value Ref Range   Fluid Type-FCT BRONCHIAL ALVEOLAR LAVAGE    Color, Fluid STRAW (A) YELLOW   Appearance, Fluid CLOUDY (A) CLEAR   WBC, Fluid 440 0 -  1000 cu mm   Neutrophil Count, Fluid 9 0 - 25 %   Lymphs, Fluid 6 %   Monocyte-Macrophage-Serous Fluid 85 50 - 90 %   Eos, Fluid 0 %  Basic metabolic panel     Status: Abnormal   Collection Time: 01/23/15  6:20 AM  Result Value Ref Range   Sodium 139 135 - 145 mmol/L   Potassium 3.7 3.5 - 5.1 mmol/L   Chloride 102 101 - 111 mmol/L   CO2 28 22 - 32 mmol/L   Glucose, Bld 93 65 - 99 mg/dL   BUN <5  (L) 6 - 20 mg/dL   Creatinine, Ser 7.34 0.61 - 1.24 mg/dL   Calcium 8.9 8.9 - 03.7 mg/dL   GFR calc non Af Amer >60 >60 mL/min   GFR calc Af Amer >60 >60 mL/min    Comment: (NOTE) The eGFR has been calculated using the CKD EPI equation. This calculation has not been validated in all clinical situations. eGFR's persistently <60 mL/min signify possible Chronic Kidney Disease.    Anion gap 9 5 - 15  CBC     Status: Abnormal   Collection Time: 01/23/15  6:20 AM  Result Value Ref Range   WBC 21.5 (H) 4.0 - 10.5 K/uL   RBC 4.13 (L) 4.22 - 5.81 MIL/uL   Hemoglobin 11.1 (L) 13.0 - 17.0 g/dL   HCT 09.6 (L) 43.8 - 38.1 %   MCV 82.1 78.0 - 100.0 fL   MCH 26.9 26.0 - 34.0 pg   MCHC 32.7 30.0 - 36.0 g/dL   RDW 84.0 37.5 - 43.6 %   Platelets 428 (H) 150 - 400 K/uL      Component Value Date/Time   SDES BRONCHIAL ALVEOLAR LAVAGE 01/22/2015 1025   SDES BRONCHIAL ALVEOLAR LAVAGE 01/22/2015 1025   SPECREQUEST Normal 01/22/2015 1025   SPECREQUEST Normal 01/22/2015 1025   CULT  01/22/2015 1025    CULTURE WILL BE EXAMINED FOR 6 WEEKS BEFORE ISSUING A FINAL REPORT Performed at [facility]    CULT  01/22/2015 1025    CULTURE IN PROGRESS FOR FOUR WEEKS Performed at [facility]    REPTSTATUS PENDING 01/22/2015 1025   REPTSTATUS PENDING 01/22/2015 1025   No results found. Recent Results (from the past 240 hour(s))  Culture, group A strep     Status: None   Collection Time: 01/18/15  5:44 PM  Result Value Ref Range Status   Specimen Description THROAT  Final   Special Requests NONE  Final   Culture NO GROUP A STREP (S.PYOGENES) ISOLATED  Final   Report Status 01/21/2015 FINAL  Final  Culture, expectorated sputum-assessment     Status: None   Collection Time: 01/18/15 11:15 PM  Result Value Ref Range Status   Specimen Description EXPECTORATED SPUTUM  Final   Special Requests NONE  Final   Sputum evaluation   Final    THIS SPECIMEN IS ACCEPTABLE. RESPIRATORY CULTURE  REPORT TO FOLLOW.   Report Status 01/19/2015 FINAL  Final  Culture, respiratory (NON-Expectorated)     Status: None   Collection Time: 01/19/15  1:01 AM  Result Value Ref Range Status   Specimen Description EXPECTORATED SPUTUM  Final   Special Requests NONE  Final   Gram Stain   Final    RARE WBC PRESENT, PREDOMINANTLY PMN RARE SQUAMOUS EPITHELIAL CELLS PRESENT MODERATE GRAM POSITIVE COCCI IN PAIRS IN CHAINS FEW GRAM NEGATIVE RODS Performed at [facility]    Culture   Final  NORMAL OROPHARYNGEAL FLORA Performed at [facility]    Report Status 01/21/2015 FINAL  Final  Culture, blood (routine x 2) Call MD if unable to obtain prior to antibiotics being given     Status: None (Preliminary result)   Collection Time: 01/19/15  6:07 AM  Result Value Ref Range Status   Specimen Description BLOOD LEFT HAND  Final   Special Requests IN PEDIATRIC BOTTLE 3MLS  Final   Culture NO GROWTH 4 DAYS  Final   Report Status PENDING  Incomplete  Culture, blood (routine x 2) Call MD if unable to obtain prior to antibiotics being given     Status: None (Preliminary result)   Collection Time: 01/19/15  6:30 AM  Result Value Ref Range Status   Specimen Description BLOOD LEFT HAND  Final   Special Requests IN PEDIATRIC BOTTLE 3MLS  Final   Culture NO GROWTH 4 DAYS  Final   Report Status PENDING  Incomplete  AFB culture with smear     Status: None (Preliminary result)   Collection Time: 01/19/15  8:59 PM  Result Value Ref Range Status   Specimen Description SPUTUM  Final   Special Requests NONE  Final   Acid Fast Smear   Final    NO ACID FAST BACILLI SEEN Performed at [facility]    Culture   Final    CULTURE WILL BE EXAMINED FOR 6 WEEKS BEFORE ISSUING A FINAL REPORT Performed at [facility]    Report Status PENDING  Incomplete  AFB culture with smear     Status: None (Preliminary result)   Collection Time: 01/20/15  1:08 PM  Result Value Ref Range Status    Specimen Description SPUTUM  Final   Special Requests NONE  Final   Acid Fast Smear   Final    NO ACID FAST BACILLI SEEN Performed at [facility]    Culture   Final    CULTURE WILL BE EXAMINED FOR 6 WEEKS BEFORE ISSUING A FINAL REPORT Performed at [facility]    Report Status PENDING  Incomplete  MRSA PCR Screening     Status: None   Collection Time: 01/21/15  6:35 PM  Result Value Ref Range Status   MRSA by PCR NEGATIVE NEGATIVE Final    Comment:        The GeneXpert MRSA Assay (FDA approved for NASAL specimens only), is one component of a comprehensive MRSA colonization surveillance program. It is not intended to diagnose MRSA infection nor to guide or monitor treatment for MRSA infections.   AFB culture with smear     Status: None (Preliminary result)   Collection Time: 01/22/15 10:25 AM  Result Value Ref Range Status   Specimen Description BRONCHIAL ALVEOLAR LAVAGE  Final   Special Requests Normal  Final   Acid Fast Smear   Final    NO ACID FAST BACILLI SEEN Performed at [facility]    Culture   Final    CULTURE WILL BE EXAMINED FOR 6 WEEKS BEFORE ISSUING A FINAL REPORT Performed at [facility]    Report Status PENDING  Incomplete  Fungus Culture with Smear     Status: None (Preliminary result)   Collection Time: 01/22/15 10:25 AM  Result Value Ref Range Status   Specimen Description BRONCHIAL ALVEOLAR LAVAGE  Final   Special Requests Normal  Final   Fungal Smear   Final    NO YEAST OR FUNGAL ELEMENTS SEEN Performed at [facility]    Culture   Final    CULTURE IN PROGRESS FOR FOUR WEEKS Performed at [facility]    Report Status PENDING  Incomplete      01/23/2015, 2:31 PM     LOS: 4 days    Records and images were personally reviewed where available.

## 2015-01-24 ENCOUNTER — Inpatient Hospital Stay (HOSPITAL_COMMUNITY): Payer: Medicaid Other

## 2015-01-24 DIAGNOSIS — I808 Phlebitis and thrombophlebitis of other sites: Secondary | ICD-10-CM

## 2015-01-24 DIAGNOSIS — I97622 Postprocedural seroma of a circulatory system organ or structure following other procedure: Secondary | ICD-10-CM

## 2015-01-24 LAB — CBC
HCT: 32.4 % — ABNORMAL LOW (ref 39.0–52.0)
HEMOGLOBIN: 10.6 g/dL — AB (ref 13.0–17.0)
MCH: 26.6 pg (ref 26.0–34.0)
MCHC: 32.7 g/dL (ref 30.0–36.0)
MCV: 81.2 fL (ref 78.0–100.0)
Platelets: 425 10*3/uL — ABNORMAL HIGH (ref 150–400)
RBC: 3.99 MIL/uL — AB (ref 4.22–5.81)
RDW: 13.9 % (ref 11.5–15.5)
WBC: 18.2 10*3/uL — ABNORMAL HIGH (ref 4.0–10.5)

## 2015-01-24 LAB — CULTURE, BLOOD (ROUTINE X 2)
Culture: NO GROWTH
Culture: NO GROWTH

## 2015-01-24 MED ORDER — AMOXICILLIN-POT CLAVULANATE 875-125 MG PO TABS
1.0000 | ORAL_TABLET | Freq: Two times a day (BID) | ORAL | Status: DC
  Administered 2015-01-24: 1 via ORAL
  Filled 2015-01-24: qty 1

## 2015-01-24 MED ORDER — GUAIFENESIN ER 600 MG PO TB12: 600.0000 mg | ORAL_TABLET | Freq: Two times a day (BID) | ORAL | Status: DC

## 2015-01-24 MED ORDER — AMOXICILLIN-POT CLAVULANATE 875-125 MG PO TABS: 1.0000 | ORAL_TABLET | Freq: Two times a day (BID) | ORAL | Status: DC

## 2015-01-24 NOTE — Progress Notes (Signed)
VASCULAR LAB PRELIMINARY  PRELIMINARY  PRELIMINARY  PRELIMINARY  Left upper extremity venous duplex completed.  Preliminary report:  There is no DVT or SVT noted in the left upper extremity.  Zinia Innocent, RVT 01/24/2015, 1:46 PM

## 2015-01-24 NOTE — Care Management Note (Signed)
Case Management Note  Patient Details  Name: Jeffery Crane MRN: [id number] Date of Birth: 07/15/1995  Subjective/Objective:                  Cavitary lesion of lung Action/Plan: Discharge planning Expected Discharge Date:                  Expected Discharge Plan:  Home/Self Care  In-House Referral:     Discharge planning Services  CM Consult, Medication Assistance  Post Acute Care Choice:    Choice offered to:  Patient  DME Arranged:    DME Agency:     HH Arranged:    [place] Agency:     Status of Service:  complete  Medicare Important Message Given:    Date Medicare IM Given:    Medicare IM give by:    Date Additional Medicare IM Given:    Additional Medicare Important Message give by:     If discussed at [place] of Stay Meetings, dates discussed:    Additional Comments: CM met with pt and gave pt [place] letter with list of participating pharmacies.  Pt verbalized understanding of all MATCH parameters.  Pt states he is seen at [facility].  No other Cm needs were communicated. Dellie Catholic, RN 01/24/2015, 2:28 PM

## 2015-01-24 NOTE — Discharge Summary (Signed)
Physician Discharge Summary  Jeffery Crane JGO:[id number] DOB: 07-16-1995 DOA: 01/18/2015  PCP: Jeffery Crane date: 01/18/2015 Discharge date: 01/24/2015  Time spent: 40 minutes  Recommendations for Outpatient Follow-up:  1. Follow-up with pulmonology in 2 weeks. 2. Follow-up with CT surgery in 2 weeks. 3. Augmentin for 4 more weeks, consider repeat CT scan in 3-4 weeks.    Discharge Diagnoses:  Principal Problem:   Cavitary lesion of lung Active Problems:   Leukocytosis   Thrombocytosis (HCC)   Anemia   CAP (community acquired pneumonia)   Cavitary lung disease   Discharge Condition: Stable  Diet recommendation: Regular  Filed Weights   01/18/15 1946 01/22/15 0933  Weight: 89.404 kg (197 lb 1.6 oz) 89.359 kg (197 lb)    History of present illness:  Jeffery Crane is a 20 year old male who was previously healthy; who presents for implant of a persistent cough. Symptoms have been on over the last month and progressively worsening. He reports a intermittently productive cough. Has tried Tylenol, Mucinex, over-the-counter Robitussin, and other cough medicines with only minimal relief of symptoms. Denies any travel outside of the [place] or outside of [place]. Denies any recent sick contacts to his knowledge. Associated symptoms include subjective fever, possible night sweats, and malaise. Denies complaints of shortness of breath. He does endorse weight loss of close to 10 - 15 pounds over the last few weeks, but states that he wanted to lose weight and has decrease the number of calories daily and takes along with intermittent exercise.  Upon admission patient was found to have elevated WBC of 20.8  Hospital Course:   Cavitary lesion of lung -Presented to the hospital with productive cough and occasional hemoptysis. -No TB exposure, no recent sick contact. -CT/CXR showed cavitary lung lesion in the RUL with air-fluid level -Negative screening for  HIV and influenza. -ESR is 131 -QuantiFERON gold TB test is indeterminate. -FOBT done, smears negative for AFB and CBC. -Both PCCM and ID recommended Augmentin for 4-6 weeks, I prescribed Augmentin for 4 more weeks. -Follow-up with PCCM and CT surgery as outpatient, consider repeating CT scan with contrast in 4 weeks.  Possible community-acquired pneumonia:  -Was on Unasyn/Vanc, likely necrotizing pneumonia.  Leukocytosis: Acute. WBC elevated at 20.8 on admission - Continue to monitor, slightly improved to 18.2 on discharge, does not look toxic.  Thrombocytosis (HCC) -Platelet count elevated at 416. Suspect likely reactive to above.  Anemia: Acute Hemoglobin 11.9 with low limit of normal MCV and MCH. - Follow-up CBC   Left upper extremity swelling -Patient has had IV access/fluids in the Left upper extremity, developed swelling. -Doppler ordered and preliminary showed no evidence of DVT or SVT.   Procedures:  Fiberoptic bronchoscopy with bronchial generalized done by Jeffery Crane on 01/22/2015  Doppler ultrasound showed preliminary report with no DVT or SVT noted in the upper extremity.  Consultations:  CTS  PCCM  ID  Discharge Exam: Filed Vitals:   01/23/15 2200 01/24/15 0600  BP: 112/76 120/65  Pulse: 104 92  Temp: 98.6 F (37 C) 99.8 F (37.7 C)  Resp: 17 18  General: Alert and awake, oriented x3, not in any acute distress. HEENT: anicteric sclera, pupils reactive to light and accommodation, EOMI CVS: S1-S2 clear, no murmur rubs or gallops Chest: clear to auscultation bilaterally, no wheezing, rales or rhonchi Abdomen: soft nontender, nondistended, normal bowel sounds, no organomegaly Extremities: no cyanosis, clubbing or edema noted bilaterally Neuro: Cranial nerves II-XII intact, no  focal neurological deficits  Discharge Instructions   Discharge Instructions    Increase activity slowly    Complete by:  As directed           Current Discharge  Medication List    START taking these medications   Details  amoxicillin-clavulanate (AUGMENTIN) 875-125 MG tablet Take 1 tablet by mouth 2 (two) times daily. Qty: 56 tablet, Refills: 0    guaiFENesin (MUCINEX) 600 MG 12 hr tablet Take 1 tablet (600 mg total) by mouth 2 (two) times daily.      CONTINUE these medications which have NOT CHANGED   Details  cyclobenzaprine (FLEXERIL) 10 MG tablet Take 0.5-1 tablets (5-10 mg total) by mouth 2 (two) times daily as needed for muscle spasms. Qty: 30 tablet, Refills: 0    gabapentin (NEURONTIN) 100 MG capsule Take 1 capsule (100 mg total) by mouth 3 (three) times daily. Qty: 90 capsule, Refills: 0    omeprazole (PRILOSEC) 20 MG capsule Take 1 capsule (20 mg total) by mouth daily. Qty: 30 capsule, Refills: 0      STOP taking these medications     meloxicam (MOBIC) 15 MG tablet      fluticasone (FLONASE) 50 MCG/ACT nasal spray        Allergies  Allergen Reactions  . Bactrim Other (See Comments)    Unknown children reaction per pt's mom   Follow-up Information    Follow up with Jeffery Crane In 1 week.   Contact information:   [address] [place] [address] [phone]       Follow up with Jeffery Mannam, MD In 2 weeks.   Specialty:  Pulmonary Disease   Contact information:   [address] [place] [address] [phone]       Follow up with Jeffery C Hendrickson, MD In 2 weeks.   Specialty:  Cardiothoracic Surgery   Contact information:   [address] [place] [address] [phone]        The results of significant diagnostics from this hospitalization (including imaging, microbiology, ancillary and laboratory) are listed below for reference.    Significant Diagnostic Studies: Dg Chest 2 View  01/18/2015  CLINICAL DATA:  19-year-old male with history of cough. Swollen lymph node on the right side of the neck. EXAM: CHEST  2 VIEW COMPARISON:  Chest  x-ray 07/11/2014. FINDINGS: Airspace consolidation in the anterior aspect of the right upper lobe with apparent areas of cavitation and air-fluid levels noted. Right hilar fullness and right paratracheal fullness suggestive of adenopathy. Left lung is clear. No pleural effusions. No evidence of pulmonary edema. No pneumothorax. Heart size is normal. IMPRESSION: 1. Findings are concerning for cavitary pneumonia in the right upper lobe with associated right hilar and right paratracheal lymphadenopathy. This could represent typical organisms, or atypical organisms, including tuberculosis. Clinical correlation is strongly recommended. Followup PA and lateral chest X-ray is recommended in 3-4 weeks following trial of antibiotic therapy to ensure resolution and exclude underlying malignancy. Electronically Signed   By: Daniel  Entrikin M.D.   On: 01/18/2015 18:30   Ct Chest W Contrast  01/19/2015  CLINICAL DATA:  Productive cough and hemoptysis for the past month, with night sweats. Radiographic findings suspicious for cavitary pneumonia on the right. EXAM: CT CHEST WITH CONTRAST TECHNIQUE: Multidetector CT imaging of the chest was performed during intravenous contrast administration. CONTRAST:  75mL OMNIPAQUE IOHEXOL 300 MG/ML  SOLN COMPARISON:  Chest radiographs, the most   recent dated 01/18/2015 and 07/11/2014. FINDINGS: There is a triangular area of dense consolidation containing cylindrically dilated bronchi in the inferior, anterior aspect of the right upper lobe. This contains a thin walled cavitary component laterally containing an air-fluid level. This component measures 2.7 x 2.4 cm on image number 28. There is also a thick walled, rounded component with an irregular central cavitary component. This thick-walled component is located superiorly and anteriorly and measures 3.7 x 3.2 cm on image number 23. More inferiorly and medially, this has a solid component extending into the anterior mediastinum. This solid  component measures 5.3 x 4.2 cm on image number 24. The cavitary and solid appearing component combined measure 8.7 x 6.6 cm in maximum diameter on image number 27, including extension into the mediastinum. This is causing mild compression and flattening of the anterolateral aspect of the superior vena cava. Also demonstrated are multiple enlarged mediastinal and right hilar lymph nodes. The largest node is a right paratracheal node with a short axis diameter of 2.8 cm on image 18. A right hilar node has a short axis diameter of 1.3 cm on image number 28. The left lung is clear. The upper abdomen is unremarkable. The bones appear normal. IMPRESSION: 1. Large, multi cavitary mass-like area in the anterior, medial aspect of the right upper lobe with associated cylindrical bronchiectasis, air-fluid level and mediastinal invasion. This was not present on 07/11/2014 and is most likely infectious in origin, with differential considerations including tuberculosis and fungal infection. A rapidly growing, cavitary neoplasm is less likely. 2. Mediastinal and right hilar adenopathy. This could be reactive or metastatic. Electronically Signed   By: Claudie Revering M.D.   On: 01/19/2015 14:56    Microbiology: Recent Results (from the past 240 hour(s))  Culture, group A strep     Status: None   Collection Time: 01/18/15  5:44 PM  Result Value Ref Range Status   Specimen Description THROAT  Final   Special Requests NONE  Final   Culture NO GROUP A STREP (S.PYOGENES) ISOLATED  Final   Report Status 01/21/2015 FINAL  Final  Culture, expectorated sputum-assessment     Status: None   Collection Time: 01/18/15 11:15 PM  Result Value Ref Range Status   Specimen Description EXPECTORATED SPUTUM  Final   Special Requests NONE  Final   Sputum evaluation   Final    THIS SPECIMEN IS ACCEPTABLE. RESPIRATORY CULTURE REPORT TO FOLLOW.   Report Status 01/19/2015 FINAL  Final  Culture, respiratory (NON-Expectorated)     Status:  None   Collection Time: 01/19/15  1:01 AM  Result Value Ref Range Status   Specimen Description EXPECTORATED SPUTUM  Final   Special Requests NONE  Final   Gram Stain   Final    RARE WBC PRESENT, PREDOMINANTLY PMN RARE SQUAMOUS EPITHELIAL CELLS PRESENT MODERATE GRAM POSITIVE COCCI IN PAIRS IN CHAINS FEW GRAM NEGATIVE RODS Performed at [facility]    Culture   Final    NORMAL OROPHARYNGEAL FLORA Performed at [facility]    Report Status 01/21/2015 FINAL  Final  Culture, blood (routine x 2) Call MD if unable to obtain prior to antibiotics being given     Status: None (Preliminary result)   Collection Time: 01/19/15  6:07 AM  Result Value Ref Range Status   Specimen Description BLOOD LEFT HAND  Final   Special Requests IN PEDIATRIC BOTTLE 3MLS  Final   Culture NO GROWTH 4 DAYS  Final   Report Status  PENDING  Incomplete  Culture, blood (routine x 2) Call MD if unable to obtain prior to antibiotics being given     Status: None (Preliminary result)   Collection Time: 01/19/15  6:30 AM  Result Value Ref Range Status   Specimen Description BLOOD LEFT HAND  Final   Special Requests IN PEDIATRIC BOTTLE 3MLS  Final   Culture NO GROWTH 4 DAYS  Final   Report Status PENDING  Incomplete  AFB culture with smear     Status: None (Preliminary result)   Collection Time: 01/19/15  8:59 PM  Result Value Ref Range Status   Specimen Description SPUTUM  Final   Special Requests NONE  Final   Acid Fast Smear   Final    NO ACID FAST BACILLI SEEN Performed at [facility]    Culture   Final    CULTURE WILL BE EXAMINED FOR 6 WEEKS BEFORE ISSUING A FINAL REPORT Performed at [facility]    Report Status PENDING  Incomplete  AFB culture with smear     Status: None (Preliminary result)   Collection Time: 01/20/15  1:08 PM  Result Value Ref Range Status   Specimen Description SPUTUM  Final   Special Requests NONE  Final   Acid Fast Smear   Final    NO ACID FAST  BACILLI SEEN Performed at [facility]    Culture   Final    CULTURE WILL BE EXAMINED FOR 6 WEEKS BEFORE ISSUING A FINAL REPORT Performed at [facility]    Report Status PENDING  Incomplete  MRSA PCR Screening     Status: None   Collection Time: 01/21/15  6:35 PM  Result Value Ref Range Status   MRSA by PCR NEGATIVE NEGATIVE Final    Comment:        The GeneXpert MRSA Assay (FDA approved for NASAL specimens only), is one component of a comprehensive MRSA colonization surveillance program. It is not intended to diagnose MRSA infection nor to guide or monitor treatment for MRSA infections.   AFB culture with smear     Status: None (Preliminary result)   Collection Time: 01/22/15 10:25 AM  Result Value Ref Range Status   Specimen Description BRONCHIAL ALVEOLAR LAVAGE  Final   Special Requests Normal  Final   Acid Fast Smear   Final    NO ACID FAST BACILLI SEEN Performed at [facility]    Culture   Final    CULTURE WILL BE EXAMINED FOR 6 WEEKS BEFORE ISSUING A FINAL REPORT Performed at [facility]    Report Status PENDING  Incomplete  Culture, bal-quantitative     Status: None (Preliminary result)   Collection Time: 01/22/15 10:25 AM  Result Value Ref Range Status   Specimen Description BRONCHIAL ALVEOLAR LAVAGE  Final   Special Requests Normal  Final   Gram Stain   Final    RARE WBC PRESENT, PREDOMINANTLY PMN RARE SQUAMOUS EPITHELIAL CELLS PRESENT NO ORGANISMS SEEN Performed at [facility]    Culture   Final    NO GROWTH 1 DAY Performed at [facility]    Report Status PENDING  Incomplete  Fungus Culture with Smear     Status: None (Preliminary result)   Collection Time: 01/22/15 10:25 AM  Result Value Ref Range Status   Specimen Description BRONCHIAL ALVEOLAR LAVAGE  Final   Special Requests Normal  Final   Fungal Smear   Final    NO YEAST   OR FUNGAL ELEMENTS SEEN Performed at [facility]     Culture   Final    CULTURE IN PROGRESS FOR FOUR WEEKS Performed at [facility]    Report Status PENDING  Incomplete     Labs: Basic Metabolic Panel:  Recent Labs Lab 01/18/15 2222 01/22/15 0529 01/23/15 0620  NA 137 139 139  K 3.9 3.7 3.7  CL 101 106 102  CO2 25 25 28  GLUCOSE 100* 105* 93  BUN 7 <5* <5*  CREATININE 0.83 0.87 0.85  CALCIUM 8.9 8.8* 8.9   Liver Function Tests:  Recent Labs Lab 01/18/15 2222  AST 16  ALT 24  ALKPHOS 117  BILITOT 0.2*  PROT 7.2  ALBUMIN 2.6*   No results for input(s): LIPASE, AMYLASE in the last 168 hours. No results for input(s): AMMONIA in the last 168 hours. CBC:  Recent Labs Lab 01/18/15 2222 01/22/15 0529 01/23/15 0620 01/24/15 0545  WBC 20.8* 19.1* 21.5* 18.2*  NEUTROABS 17.1*  --   --   --   HGB 11.9* 10.7* 11.1* 10.6*  HCT 36.2* 33.9* 33.9* 32.4*  MCV 81.7 82.9 82.1 81.2  PLT 416* 425* 428* 425*   Cardiac Enzymes: No results for input(s): CKTOTAL, CKMB, CKMBINDEX, TROPONINI in the last 168 hours. BNP: BNP (last 3 results) No results for input(s): BNP in the last 8760 hours.  ProBNP (last 3 results) No results for input(s): PROBNP in the last 8760 hours.  CBG: No results for input(s): GLUCAP in the last 168 hours.     Signed:  ELMAHI,MUTAZ A MD.  [facility] 01/24/2015, 11:55 AM     

## 2015-01-24 NOTE — Progress Notes (Signed)
INFECTIOUS DISEASE PROGRESS NOTE  ID: Jeffery Crane is a 20 y.o. male with  Principal Problem:   Cavitary lesion of lung Active Problems:   Leukocytosis   Thrombocytosis (HCC)   Anemia   CAP (community acquired pneumonia)   Cavitary lung disease  Subjective: C/o swelling and pain in R AC fossa at IV site.   Abtx:  Anti-infectives    Start     Dose/Rate Route Frequency Ordered Stop   01/22/15 0000  vancomycin (VANCOCIN) IVPB 1000 mg/200 mL premix  Status:  Discontinued     1,000 mg 200 mL/hr over 60 Minutes Intravenous Every 8 hours 01/21/15 1355 01/23/15 1455   01/21/15 1700  Ampicillin-Sulbactam (UNASYN) 3 g in sodium chloride 0.9 % 100 mL IVPB     3 g 100 mL/hr over 60 Minutes Intravenous Every 6 hours 01/21/15 1611     01/21/15 1400  vancomycin (VANCOCIN) 1,500 mg in sodium chloride 0.9 % 500 mL IVPB     1,500 mg 250 mL/hr over 120 Minutes Intravenous  Once 01/21/15 1355 01/21/15 1920   01/21/15 1300  voriconazole (VFEND) 360 mg in sodium chloride 0.9 % 100 mL IVPB  Status:  Discontinued     4 mg/kg  89.4 kg 68 mL/hr over 120 Minutes Intravenous Every 12 hours 01/20/15 1215 01/23/15 1455   01/20/15 1300  voriconazole (VFEND) 540 mg in sodium chloride 0.9 % 150 mL IVPB     6 mg/kg  89.4 kg 102 mL/hr over 120 Minutes Intravenous Every 12 hours 01/20/15 1215 01/21/15 0438   01/19/15 2200  azithromycin (ZITHROMAX) 500 mg in dextrose 5 % 250 mL IVPB  Status:  Discontinued     500 mg 250 mL/hr over 60 Minutes Intravenous Every 24 hours 01/19/15 0453 01/21/15 1610   01/19/15 2200  cefTRIAXone (ROCEPHIN) 1 g in dextrose 5 % 50 mL IVPB  Status:  Discontinued     1 g 100 mL/hr over 30 Minutes Intravenous Every 24 hours 01/19/15 0455 01/21/15 1610   01/19/15 0452  cefTRIAXone (ROCEPHIN) 1 g in dextrose 5 % 50 mL IVPB  Status:  Discontinued     1 g 100 mL/hr over 30 Minutes Intravenous Every 24 hours 01/19/15 0453 01/19/15 0455   01/19/15 0415  cefTRIAXone (ROCEPHIN) 1 g in  dextrose 5 % 50 mL IVPB  Status:  Discontinued     1 g 100 mL/hr over 30 Minutes Intravenous Every 24 hours 01/19/15 0402 01/19/15 0453   01/19/15 0415  azithromycin (ZITHROMAX) 500 mg in dextrose 5 % 250 mL IVPB  Status:  Discontinued     500 mg 250 mL/hr over 60 Minutes Intravenous Every 24 hours 01/19/15 0402 01/19/15 0453   01/18/15 2215  cefTRIAXone (ROCEPHIN) 1 g in dextrose 5 % 50 mL IVPB     1 g 100 mL/hr over 30 Minutes Intravenous  Once 01/18/15 2204 01/18/15 2350   01/18/15 2215  azithromycin (ZITHROMAX) 500 mg in dextrose 5 % 250 mL IVPB     500 mg 250 mL/hr over 60 Minutes Intravenous  Once 01/18/15 2204 01/19/15 0115      Medications:  Scheduled: . ampicillin-sulbactam (UNASYN) IV  3 g Intravenous Q6H  . fluticasone  2 spray Each Nare Daily  . guaiFENesin  600 mg Oral BID  . pantoprazole  40 mg Oral Daily    Objective: Vital signs in last 24 hours: Temp:  [98.6 F (37 C)-99.8 F (37.7 C)] 99.8 F (37.7 C) (01/22 0600) Pulse Rate:  [  92-104] 92 (01/22 0600) Resp:  [17-18] 18 (01/22 0600) BP: (105-120)/(62-76) 120/65 mmHg (01/22 0600) SpO2:  [92 %-98 %] 92 % (01/22 0600)   General appearance: alert, cooperative and no distress Resp: clear to auscultation bilaterally Cardio: regular rate and rhythm GI: normal findings: bowel sounds normal and soft, non-tender  Induration at site of prev IV in R AC fossa.   Lab Results  Recent Labs  01/22/15 0529 01/23/15 0620 01/24/15 0545  WBC 19.1* 21.5* 18.2*  HGB 10.7* 11.1* 10.6*  HCT 33.9* 33.9* 32.4*  NA 139 139  --   K 3.7 3.7  --   CL 106 102  --   CO2 25 28  --   BUN <5* <5*  --   CREATININE 0.87 0.85  --    Liver Panel No results for input(s): PROT, ALBUMIN, AST, ALT, ALKPHOS, BILITOT, BILIDIR, IBILI in the last 72 hours. Sedimentation Rate No results for input(s): ESRSEDRATE in the last 72 hours. C-Reactive Protein No results for input(s): CRP in the last 72 hours.  Microbiology: Recent Results  (from the past 240 hour(s))  Culture, group A strep     Status: None   Collection Time: 01/18/15  5:44 PM  Result Value Ref Range Status   Specimen Description THROAT  Final   Special Requests NONE  Final   Culture NO GROUP A STREP (S.PYOGENES) ISOLATED  Final   Report Status 01/21/2015 FINAL  Final  Culture, expectorated sputum-assessment     Status: None   Collection Time: 01/18/15 11:15 PM  Result Value Ref Range Status   Specimen Description EXPECTORATED SPUTUM  Final   Special Requests NONE  Final   Sputum evaluation   Final    THIS SPECIMEN IS ACCEPTABLE. RESPIRATORY CULTURE REPORT TO FOLLOW.   Report Status 01/19/2015 FINAL  Final  Culture, respiratory (NON-Expectorated)     Status: None   Collection Time: 01/19/15  1:01 AM  Result Value Ref Range Status   Specimen Description EXPECTORATED SPUTUM  Final   Special Requests NONE  Final   Gram Stain   Final    RARE WBC PRESENT, PREDOMINANTLY PMN RARE SQUAMOUS EPITHELIAL CELLS PRESENT MODERATE GRAM POSITIVE COCCI IN PAIRS IN CHAINS FEW GRAM NEGATIVE RODS Performed at [facility]    Culture   Final    NORMAL OROPHARYNGEAL FLORA Performed at [facility]    Report Status 01/21/2015 FINAL  Final  Culture, blood (routine x 2) Call MD if unable to obtain prior to antibiotics being given     Status: None (Preliminary result)   Collection Time: 01/19/15  6:07 AM  Result Value Ref Range Status   Specimen Description BLOOD LEFT HAND  Final   Special Requests IN PEDIATRIC BOTTLE 3MLS  Final   Culture NO GROWTH 4 DAYS  Final   Report Status PENDING  Incomplete  Culture, blood (routine x 2) Call MD if unable to obtain prior to antibiotics being given     Status: None (Preliminary result)   Collection Time: 01/19/15  6:30 AM  Result Value Ref Range Status   Specimen Description BLOOD LEFT HAND  Final   Special Requests IN PEDIATRIC BOTTLE 3MLS  Final   Culture NO GROWTH 4 DAYS  Final   Report Status PENDING   Incomplete  AFB culture with smear     Status: None (Preliminary result)   Collection Time: 01/19/15  8:59 PM  Result Value Ref Range Status   Specimen Description SPUTUM  Final  Special Requests NONE  Final   Acid Fast Smear   Final    NO ACID FAST BACILLI SEEN Performed at [facility]    Culture   Final    CULTURE WILL BE EXAMINED FOR 6 WEEKS BEFORE ISSUING A FINAL REPORT Performed at [facility]    Report Status PENDING  Incomplete  AFB culture with smear     Status: None (Preliminary result)   Collection Time: 01/20/15  1:08 PM  Result Value Ref Range Status   Specimen Description SPUTUM  Final   Special Requests NONE  Final   Acid Fast Smear   Final    NO ACID FAST BACILLI SEEN Performed at [facility]    Culture   Final    CULTURE WILL BE EXAMINED FOR 6 WEEKS BEFORE ISSUING A FINAL REPORT Performed at [facility]    Report Status PENDING  Incomplete  MRSA PCR Screening     Status: None   Collection Time: 01/21/15  6:35 PM  Result Value Ref Range Status   MRSA by PCR NEGATIVE NEGATIVE Final    Comment:        The GeneXpert MRSA Assay (FDA approved for NASAL specimens only), is one component of a comprehensive MRSA colonization surveillance program. It is not intended to diagnose MRSA infection nor to guide or monitor treatment for MRSA infections.   AFB culture with smear     Status: None (Preliminary result)   Collection Time: 01/22/15 10:25 AM  Result Value Ref Range Status   Specimen Description BRONCHIAL ALVEOLAR LAVAGE  Final   Special Requests Normal  Final   Acid Fast Smear   Final    NO ACID FAST BACILLI SEEN Performed at [facility]    Culture   Final    CULTURE WILL BE EXAMINED FOR 6 WEEKS BEFORE ISSUING A FINAL REPORT Performed at [facility]    Report Status PENDING  Incomplete  Culture, bal-quantitative     Status: None (Preliminary result)   Collection Time: 01/22/15 10:25 AM  Result  Value Ref Range Status   Specimen Description BRONCHIAL ALVEOLAR LAVAGE  Final   Special Requests Normal  Final   Gram Stain   Final    RARE WBC PRESENT, PREDOMINANTLY PMN RARE SQUAMOUS EPITHELIAL CELLS PRESENT NO ORGANISMS SEEN Performed at [facility]    Culture   Final    NO GROWTH 1 DAY Performed at [facility]    Report Status PENDING  Incomplete  Fungus Culture with Smear     Status: None (Preliminary result)   Collection Time: 01/22/15 10:25 AM  Result Value Ref Range Status   Specimen Description BRONCHIAL ALVEOLAR LAVAGE  Final   Special Requests Normal  Final   Fungal Smear   Final    NO YEAST OR FUNGAL ELEMENTS SEEN Performed at [facility]    Culture   Final    CULTURE IN PROGRESS FOR FOUR WEEKS Performed at [facility]    Report Status PENDING  Incomplete    Studies/Results: No results found.   Assessment/Plan: L AC thrombophlebitis Check doppler of his L AC Primary team please f/u result and comment on need for anti-coagulants if needed.   Cavitary PNA Would change to augmentin D/c planning F/u CT in 4-6 weeks.   Total days of antibiotics: 4 unasyn         Mekhi Rumpf Infectious Diseases (pager) [phone] [email or web address] 01/24/2015, 11:26 AM  LOS:  5 days

## 2015-01-25 ENCOUNTER — Other Ambulatory Visit: Payer: Self-pay | Admitting: *Deleted

## 2015-01-25 ENCOUNTER — Encounter (HOSPITAL_COMMUNITY): Payer: Self-pay | Admitting: Pulmonary Disease

## 2015-01-25 ENCOUNTER — Telehealth: Payer: Self-pay | Admitting: *Deleted

## 2015-01-25 DIAGNOSIS — J984 Other disorders of lung: Secondary | ICD-10-CM

## 2015-01-25 DIAGNOSIS — J189 Pneumonia, unspecified organism: Secondary | ICD-10-CM

## 2015-01-25 LAB — CULTURE, BAL-QUANTITATIVE: SPECIAL REQUESTS: NORMAL

## 2015-01-25 LAB — PNEUMOCYSTIS JIROVECI SMEAR BY DFA: PNEUMOCYSTIS JIROVECI AG: NEGATIVE

## 2015-01-25 LAB — CULTURE, BAL-QUANTITATIVE W GRAM STAIN
Colony Count: NO GROWTH
Culture: NO GROWTH

## 2015-01-25 NOTE — Progress Notes (Signed)
On Fri. Jan. 20, Phillis Knack and Shelina Luo performed a video bronchoscopy with Dr. Vaughan Browner, we wasted 3mg  of versed, and 50 Fentanyl in sink.

## 2015-01-25 NOTE — Telephone Encounter (Signed)
Called spoke with patient's grandmother Jeffery Crane and discussed with her PM's recs as stated below (pt was not in) Trainer voiced her understanding and will relay message to pt Order placed for repeat CT in 5 weeks with follow up appt w/ PM for HFU and discuss CT results  Will hold in my box to document CT and PM appts

## 2015-01-25 NOTE — Telephone Encounter (Signed)
-----   Message from Marshell Garfinkel, MD sent at 01/23/2015  1:07 PM EST ----- Can you schedule a CT scan follow up 5 weeks from now for this pt and a follow appointment with me after the CT scan.  Thanks PM

## 2015-01-27 LAB — ANCA TITERS: Atypical P-ANCA titer: <1:20 {titer}

## 2015-01-27 LAB — ANTINUCLEAR ANTIBODIES, IFA: ANTINUCLEAR ANTIBODIES, IFA: NEGATIVE

## 2015-01-28 NOTE — Telephone Encounter (Signed)
CT is scheduled for 2.27.17 @ 1130 Pt is scheduled to see Dr Roxan Hockey on 2.28.17 @ 25 Pt is scheduled to see PM first available after the CT on 3.7.17 @ 4pm  Will sign and route to PM as [place]

## 2015-02-04 ENCOUNTER — Emergency Department (INDEPENDENT_AMBULATORY_CARE_PROVIDER_SITE_OTHER)
Admission: EM | Admit: 2015-02-04 | Discharge: 2015-02-04 | Disposition: A | Discharge status: Home / Self Care | Payer: Medicaid Other | Source: Home / Self Care | Attending: Family Medicine | Admitting: Family Medicine

## 2015-02-04 ENCOUNTER — Encounter (HOSPITAL_COMMUNITY): Payer: Self-pay | Admitting: Emergency Medicine

## 2015-02-04 DIAGNOSIS — J189 Pneumonia, unspecified organism: Secondary | ICD-10-CM

## 2015-02-04 DIAGNOSIS — L270 Generalized skin eruption due to drugs and medicaments taken internally: Secondary | ICD-10-CM

## 2015-02-04 DIAGNOSIS — T50995A Adverse effect of other drugs, medicaments and biological substances, initial encounter: Secondary | ICD-10-CM

## 2015-02-04 DIAGNOSIS — J984 Other disorders of lung: Secondary | ICD-10-CM

## 2015-02-04 MED ORDER — METRONIDAZOLE 500 MG PO TABS: 500.0000 mg | ORAL_TABLET | Freq: Three times a day (TID) | ORAL | Status: DC

## 2015-02-04 MED ORDER — HYDROXYZINE HCL 10 MG PO TABS: 10.0000 mg | ORAL_TABLET | Freq: Three times a day (TID) | ORAL | Status: DC | PRN

## 2015-02-04 MED ORDER — LEVOFLOXACIN 500 MG PO TABS: 500.0000 mg | ORAL_TABLET | Freq: Every day | ORAL | Status: DC

## 2015-02-04 NOTE — ED Provider Notes (Signed)
CSN: [id number]     Arrival date & time 02/04/15  1443 History   First MD Initiated Contact with Patient 02/04/15 1544     Chief Complaint  Patient presents with  . Rash  . Medication Reaction   (Consider location/radiation/quality/duration/timing/severity/associated sxs/prior Treatment) HPI Jeffery Crane is a 20 y.o. male presenting for a rash.   He reports taking augmentin as directed for the past week since discharge from [facility] where he was admitted for cavitary community-acquired pneumonia. He noticed a widespread itchy red rash breaking out 3 days ago and went to the pharmacy where a pharmacist told him he needed to see the doctor and to stop the antibiotic. He stopped it, the rash is now much better, hardly noticeable but somewhat itchy on his arms. He has had no fevers, chills, trouble breathing. He has had improvement in cough and no sputum. No abd pain, N/V/D. He denies smoking and reports occasional binge drinking in the past.   Past Medical History  Diagnosis Date  . Adult ADHD    Past Surgical History  Procedure Laterality Date  . Video bronchoscopy Bilateral 01/22/2015    Procedure: VIDEO BRONCHOSCOPY WITH FLUORO;  Surgeon: Marshell Garfinkel, MD;  Location: [facility];  Service: Cardiopulmonary;  Laterality: Bilateral;   No family history on file. Social History  Substance Use Topics  . Smoking status: Never Smoker   . Smokeless tobacco: None  . Alcohol Use: Yes     Comment: rare    Review of Systems As above  Allergies  Amoxicillin-pot clavulanate and Bactrim  Home Medications   Prior to Admission medications   Medication Sig Start Date End Date Taking? Authorizing Provider  cyclobenzaprine (FLEXERIL) 10 MG tablet Take 0.5-1 tablets (5-10 mg total) by mouth 2 (two) times daily as needed for muscle spasms. Patient not taking: Reported on 01/19/2015 07/11/14   Delos Haring, PA-C  gabapentin (NEURONTIN) 100 MG capsule Take 1 capsule (100 mg total) by  mouth 3 (three) times daily. Patient not taking: Reported on 01/19/2015 09/21/14   Waldemar Dickens, MD  guaiFENesin (MUCINEX) 600 MG 12 hr tablet Take 1 tablet (600 mg total) by mouth 2 (two) times daily. 01/24/15   Verlee Monte, MD  hydrOXYzine (ATARAX/VISTARIL) 10 MG tablet Take 1 tablet (10 mg total) by mouth 3 (three) times daily as needed for itching. 02/04/15   Patrecia Pour, MD  levofloxacin (LEVAQUIN) 500 MG tablet Take 1 tablet (500 mg total) by mouth daily. take entire course 02/04/15   Patrecia Pour, MD  metroNIDAZOLE (FLAGYL) 500 MG tablet Take 1 tablet (500 mg total) by mouth 3 (three) times daily. 02/04/15   Patrecia Pour, MD  omeprazole (PRILOSEC) 20 MG capsule Take 1 capsule (20 mg total) by mouth daily. Patient not taking: Reported on 01/19/2015 07/11/14   Delos Haring, PA-C   Meds Ordered and Administered this Visit  Medications - No data to display  BP 131/78 mmHg  Pulse 100  Temp(Src) 99 F (37.2 C) (Oral)  Resp 16  SpO2 100% No data found.  Physical Exam  Constitutional: He appears well-developed and well-nourished. No distress.  HENT:  Mouth/Throat: No oropharyngeal exudate.  Neck: Normal range of motion.  Cardiovascular: Normal rate and regular rhythm.   No murmur heard. Pulmonary/Chest: Effort normal. No respiratory distress. He has no wheezes. He has no rales.  Lymphadenopathy:    He has no cervical adenopathy.  Skin: Skin is warm and dry.  very faint macular erythematous  eruption most notable on dorsal arms, somewhat on upper chest without confluence. No vesicular lesions.   Vitals reviewed.  ED Course  Procedures (including critical care time)  Labs Review Labs Reviewed - No data to display  Imaging Review No results found.   Visual Acuity Review  Right Eye Distance:   Left Eye Distance:   Bilateral Distance:    Right Eye Near:   Left Eye Near:    Bilateral Near:      MDM   1. Allergic drug rash   2. Cavitary pneumonia    Blood and BAL sputum  cultures are negative/no organisms seen, AFB are preliminarily negative. Fungal smear negative. Case discussed with [place] and decided on changing to respiratory quinolone and anaerobic coverage with flagyl. Consideration given to clindamycin, but this would be cost prohibitive. Looked up drugs on goodrx.com and texted coupons to pt's phone to pick up [facility]quin at [facility] and flagyl at [facility] (both under $15). - Levaquin 500mg  daily and flagyl 500mg  TID - urged to abstain from alcohol.  - Discontinue augmentin (rash improving since discontinuation) - Allergy list updated with rash reaction - Follow up as already scheduled with pulmonology, CT surgery, and follow up here as needed.   Ryan B. Bonner Puna, MD, PGY-3 02/04/2015 4:51 PM   Patrecia Pour, MD 02/04/15 [phone]

## 2015-02-04 NOTE — ED Notes (Addendum)
Patient was seen at [facility], sent to [facility] department for further treatment.  Patient reports he was taking amoxicillin and meloxicam.  Patient developed a rash.  Patient took amoxicillin for 8 days, consulted a pharmacist and was told to stop antibiotic.  Antibiotic was stopped 3 days ago.  Patient reports rash is fading, minimal splotchy redness to chest and legs.  Patient requesting an alternate antibiotic to take in treatment of pneumonia.  Patient reports he is feeling much better than he did at initial visit to [facility]

## 2015-02-04 NOTE — Discharge Instructions (Signed)
STOP taking augmentin (amoxicillin) START taking 2 antibiotics, and continue taking them for an entire 3 weeks  1) levaquin 500mg  once a day 2) flagyl (metronidazole) 500mg  three times per day (for example, 8am, 3pm, 10pm)  You must not drink alcohol during this time.  Make sure to keep all follow up appointments already scheduled and return here if you experience new rash, cough, fever, or trouble breathing.

## 2015-02-06 LAB — FUNGAL ANTIBODIES PANEL, ID-BLOOD
ASPERGILLUS FLAVUS: NEGATIVE
ASPERGILLUS NIGER: NEGATIVE
Aspergillus fumigatus, IgG: NEGATIVE
Blastomyces Abs, Qn, DID: NEGATIVE
Coccidioides ABs, QN, DID: NEGATIVE
HISTOPLASMA AB ID: NEGATIVE

## 2015-02-10 LAB — LEGIONELLA CULTURE: SPECIAL REQUESTS: NORMAL

## 2015-02-19 LAB — FUNGUS CULTURE W SMEAR
Fungal Smear: NONE SEEN
SPECIAL REQUESTS: NORMAL

## 2015-02-23 ENCOUNTER — Ambulatory Visit: Payer: Medicaid Other | Admitting: Thoracic Surgery (Cardiothoracic Vascular Surgery)

## 2015-03-01 ENCOUNTER — Ambulatory Visit (INDEPENDENT_AMBULATORY_CARE_PROVIDER_SITE_OTHER)
Admission: RE | Admit: 2015-03-01 | Discharge: 2015-03-01 | Disposition: A | Discharge status: Home / Self Care | Payer: Self-pay | Source: Ambulatory Visit | Attending: Pulmonary Disease | Admitting: Pulmonary Disease

## 2015-03-01 ENCOUNTER — Telehealth: Payer: Self-pay | Admitting: Pulmonary Disease

## 2015-03-01 DIAGNOSIS — J189 Pneumonia, unspecified organism: Secondary | ICD-10-CM

## 2015-03-01 DIAGNOSIS — J984 Other disorders of lung: Secondary | ICD-10-CM

## 2015-03-01 NOTE — Telephone Encounter (Signed)
Per Dr. Elsworth Soho:  Dr. Elsworth Soho spoke with Dr. Tery Sanfilippo regarding CT results.  CT is worse, (in Epic) Patient needs follow up appointment with Dr. Vaughan Browner.   FYI to Dr. Lavonna Rua

## 2015-03-02 ENCOUNTER — Ambulatory Visit: Payer: Self-pay | Admitting: Thoracic Surgery (Cardiothoracic Vascular Surgery)

## 2015-03-03 NOTE — Telephone Encounter (Signed)
Pt is already scheduled for HFU w/ PM on 3.7.17 @ 4pm Will sign off

## 2015-03-04 ENCOUNTER — Encounter (HOSPITAL_COMMUNITY): Payer: Self-pay

## 2015-03-04 ENCOUNTER — Emergency Department (HOSPITAL_COMMUNITY)
Admission: EM | Admit: 2015-03-04 | Discharge: 2015-03-04 | Disposition: A | Discharge status: Home / Self Care | Payer: Self-pay | Attending: Emergency Medicine | Admitting: Emergency Medicine

## 2015-03-04 DIAGNOSIS — R05 Cough: Secondary | ICD-10-CM | POA: Insufficient documentation

## 2015-03-04 DIAGNOSIS — R079 Chest pain, unspecified: Secondary | ICD-10-CM | POA: Insufficient documentation

## 2015-03-04 LAB — AFB CULTURE WITH SMEAR (NOT AT ARMC)
ACID FAST SMEAR: NONE SEEN
Acid Fast Smear: NONE SEEN

## 2015-03-04 NOTE — ED Notes (Signed)
Patient here with chest pain x 2 days, reports recent cough with same. Reports that he does not want blood work

## 2015-03-04 NOTE — ED Notes (Signed)
Called patient x 2 by Carlis Abbott, RN.   Called patient mobile number and he advised that he left.

## 2015-03-04 NOTE — ED Notes (Signed)
Pt refused blood work  

## 2015-03-05 ENCOUNTER — Other Ambulatory Visit: Payer: Self-pay | Admitting: Pulmonary Disease

## 2015-03-05 DIAGNOSIS — J984 Other disorders of lung: Secondary | ICD-10-CM

## 2015-03-05 NOTE — Progress Notes (Signed)
Quick Note:  Orders only encounter created for CT Biopsy order ______

## 2015-03-05 NOTE — Progress Notes (Signed)
Result Note     I spoke with the pt and discussed the CT results. He will need a biopsy of the lung lesions. He is reluctant to undergo another bronchoscopy. I will see if we can get a CT guided biopsy. JJ- Can we put in an order for this?

## 2015-03-07 LAB — AFB CULTURE WITH SMEAR (NOT AT ARMC)
ACID FAST SMEAR: NONE SEEN
Special Requests: NORMAL

## 2015-03-08 ENCOUNTER — Other Ambulatory Visit: Payer: Self-pay | Admitting: Cardiothoracic Surgery

## 2015-03-08 DIAGNOSIS — J984 Other disorders of lung: Secondary | ICD-10-CM

## 2015-03-09 ENCOUNTER — Encounter: Payer: Self-pay | Admitting: Thoracic Surgery (Cardiothoracic Vascular Surgery)

## 2015-03-09 ENCOUNTER — Ambulatory Visit (INDEPENDENT_AMBULATORY_CARE_PROVIDER_SITE_OTHER): Payer: Self-pay | Admitting: Thoracic Surgery (Cardiothoracic Vascular Surgery)

## 2015-03-09 ENCOUNTER — Inpatient Hospital Stay: Payer: Medicaid Other | Admitting: Pulmonary Disease

## 2015-03-09 VITALS — BP 125/77 | HR 100 | Resp 20 | Ht 69.0 in | Wt 196.0 lb

## 2015-03-09 DIAGNOSIS — R599 Enlarged lymph nodes, unspecified: Secondary | ICD-10-CM

## 2015-03-09 DIAGNOSIS — R590 Localized enlarged lymph nodes: Secondary | ICD-10-CM

## 2015-03-09 DIAGNOSIS — R918 Other nonspecific abnormal finding of lung field: Secondary | ICD-10-CM

## 2015-03-09 NOTE — Progress Notes (Signed)
[address]       ,[place] [address]             [phone]       HPI: Jeffery Crane returns today for follow-up of his right upper lobe lesion and adenopathy.  Jeffery Crane is a 20 year old young man who was admitted to [facility] in January with fevers, chills and a productive cough. A CT of the chest showed a cavitary right upper lobe lesion. There also was hilar and mediastinal adenopathy. He was treated for presumed lung abscess. However, there was concern that this could be a neoplastic process.  He had to stop amoxicillin after he had been home a few days due to a rash. He currently is taking levofloxacin. He is tolerating that well. He is still having night sweats, but are not aware of any fevers. He has lost about 15 pounds in the past month. He also has chest discomfort when he tries to lie flat on his back.  Past Medical History  Diagnosis Date  . Adult ADHD       Current Outpatient Prescriptions  Medication Sig Dispense Refill  . gabapentin (NEURONTIN) 100 MG capsule Take 1 capsule (100 mg total) by mouth 3 (three) times daily. 90 capsule 0  . levofloxacin (LEVAQUIN) 500 MG tablet Take 1 tablet (500 mg total) by mouth daily. take entire course 21 tablet 0  . meloxicam (MOBIC) 15 MG tablet Take 15 mg by mouth daily. Prn     No current facility-administered medications for this visit.    Physical Exam BP 125/77 mmHg  Pulse 100  Resp 20  Ht 5\' 9"  (1.753 m)  Wt 196 lb (88.905 kg)  BMI 28.93 kg/m2  SpO74 32% 20 year old man in no acute distress Anxious Alert and oriented 3 with no focal neurologic deficits Palpable right anterior cervical and scalene lymph nodes, nontender Cardiac regular rate and rhythm normal S1 and S2 no rubs murmurs or gallops Lungs clear with equal breath sounds bilaterally Abdomen soft nontender Extremities without clubbing cyanosis or edema  Diagnostic Tests: CT CHEST WITHOUT CONTRAST  TECHNIQUE: Multidetector CT  imaging of the chest was performed following the standard protocol without IV contrast.  COMPARISON: 01/19/2015  FINDINGS: Mediastinum / Lymph Nodes: The large anteromedial right upper lobe and mediastinal mass is again noted. The dominant lesion which appears to invade anterior mediastinum measures 5.6 x 3.6 cm today compared to 5.2 x 4.2 cm previously adjacent thick-walled cavitary lesions in the medial right upper lobe persists. In the interval since the prior study, the patient has developed substantial mediastinal lymphadenopathy. 2.3 cm short axis prevascular lymph node is new in the interval. 3.4 cm short axis subcarinal lymph node was 1.2 cm previously. 1.9 cm right paratracheal lymph node was 1.8 cm previously.  Lungs / Pleura: Cavitary lung lesions with satellite nodules in the anterior right upper lobe persists without substantial interval change. These are quite thick walled with the wall in some of these cavitary areas measuring up to 9-10 mm. Right lower lobe and left lung are clear. No pleural effusion. Trace pericardial effusion is new in the interval.  Upper Abdomen: Unremarkable.  MSK / Soft Tissues: Bone windows reveal no worrisome lytic or sclerotic osseous lesions.  IMPRESSION: No substantial interval change in the anteromedial right upper lobe cavitary mass-like lesion which appears to involve the mediastinum anteriorly. However, has been interval development of bulky prevascular and subcarinal lymphadenopathy. The degree of lymph node progression  in 5 weeks and lack of any discernible improvement/clearing is concerning. As such, neoplastic etiology, such as lymphoma, becomes end of higher concern.  I discussed the results of this study with Dr. Elsworth Soho, who was covering for Dr. Vaughan Browner, on the afternoon of 02/28/2014.   Electronically Signed  By: Misty Stanley M.D.  On: 03/01/2015 15:41  I personally reviewed the CT chest and her with  findings noted above. There is been progression of his adenopathy. This is highly suspicious for lymphoma rather than an infectious process.  Impression: 20 year old man with a right upper lobe mass and mediastinal adenopathy. There has been progression over the past month. 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 I highly doubt it is an infectious process. It is possible he did have a secondary bacterial infection first admitted. The signs and symptoms and CT findings are all consistent with lymphoma.  He needs a lymph node biopsy to confirm the diagnosis. I think were more likely to get a definitive diagnosis with a biopsy of the right cervical and supraclavicular lymph nodes then we are with a CT-guided needle biopsy. I recommended to him that we do this in the operating room under local anesthesia with intravenous sedation. I described the procedure to him. We should be able to get to 2 nodes through the same incision. This will be done on an outpatient basis. He may need to be out of work for a few days, but could go back earlier if he feels up to it. I informed him of the indications, risks, benefits, and alternatives. He understands this is diagnostic and not therapeutic. He understands the risks include, but are not limited to adverse reaction to medication, bleeding, infection, as well as the possibility of other unforeseeable complications. He does understand the possibility of the lymph nodes may be nondiagnostic, and if so he would need an additional biopsy procedure later date.   Plan: Right cervical and supraclavicular lymph node biopsy on Monday, 03/15/2015.  I spent 15 minutes with Jeffery Crane during this visit, greater than 50% of time was spent in counseling. [name], MD [facility] [phone]

## 2015-03-10 ENCOUNTER — Other Ambulatory Visit: Payer: Self-pay | Admitting: *Deleted

## 2015-03-10 DIAGNOSIS — R590 Localized enlarged lymph nodes: Secondary | ICD-10-CM

## 2015-03-12 ENCOUNTER — Encounter (HOSPITAL_COMMUNITY): Payer: Self-pay | Admitting: *Deleted

## 2015-03-12 ENCOUNTER — Other Ambulatory Visit: Payer: Self-pay | Admitting: General Surgery

## 2015-03-12 ENCOUNTER — Inpatient Hospital Stay (HOSPITAL_COMMUNITY)
Admission: RE | Admit: 2015-03-12 | Discharge: 2015-03-12 | Disposition: A | Discharge status: Home / Self Care | Payer: Medicaid Other | Source: Ambulatory Visit

## 2015-03-12 NOTE — Progress Notes (Signed)
Spoke with Thurmond Butts, RN, to make MD aware that pt missed PAT appointment. Thurmond Butts was also made aware that pt can still be seen today by 3:00 P.M., if available, otherwise,  pt may have to be SDW-pre-op call since surgery is Monday.

## 2015-03-12 NOTE — Pre-Procedure Instructions (Signed)
    Jeffery Crane  03/12/2015      [id number] [place], [place] [address] Phone: [phone] Fax: [phone]  [id number] [place], [place] [address] Phone: [phone] Fax: [phone]    Your procedure is scheduled on Monday, March 15, 2015  Report to [facility] Admitting at 8:00 A.M.  Call this number if you have problems the morning of surgery:  [phone]   Remember:  Do not eat food or drink liquids after midnight Sunday, March 14, 2015  Take these medicines the morning of surgery with A SIP OF WATER : None Stop taking Aspirin, vitamins, fish oil, and herbal medications. Do not take any NSAIDs ie: Ibuprofen, Advil, Naproxen, BC and Goody Powder or any medication containing Aspirin; stop now.  Do not wear jewelry, make-up or nail polish.  Do not wear lotions, powders, or perfumes.  You may not wear deodorant.  Do not shave 48 hours prior to surgery.  Men may shave face and neck.  Do not bring valuables to the hospital.  [facility] is not responsible for any belongings or valuables.  Contacts, dentures or bridgework may not be worn into surgery.  Leave your suitcase in the car.  After surgery it may be brought to your room.  For patients admitted to the hospital, discharge time will be determined by your treatment team.  Patients discharged the day of surgery will not be allowed to drive home.   Name and phone number of your driver:   Special instructions: Shower the night before surgery and the morning of surgery with CHG.  Please read over the following fact sheets that you were given. Pain Booklet, Coughing and Deep Breathing and Surgical Site Infection Prevention

## 2015-03-12 NOTE — Progress Notes (Signed)
Pt did not show for 10:00 A.M. PAT appointment. Several unsuccessful attempts have been made to contact pt; lvm on pt mobile phone.

## 2015-03-12 NOTE — Progress Notes (Signed)
Pt denies any acute cardiopulmonary symptoms. Pt denies being under the care of a cardiologist and denies having a stress test, echo and cardiac cath. Pt made aware to stop taking Aspirin, vitamins, fish oil and herbal medications. Do not take any NSAIDs ie: Ibuprofen, Advil, Naproxen, BC and goody powder or any medication containing Aspirin. Pt verbalized understanding of all pre-op instructions

## 2015-03-15 ENCOUNTER — Encounter (HOSPITAL_COMMUNITY)
Admission: RE | Disposition: A | Discharge status: Home / Self Care | Payer: Self-pay | Source: Ambulatory Visit | Attending: Thoracic Surgery (Cardiothoracic Vascular Surgery)

## 2015-03-15 ENCOUNTER — Ambulatory Visit (HOSPITAL_COMMUNITY): Payer: Self-pay | Admitting: Anesthesiology

## 2015-03-15 ENCOUNTER — Ambulatory Visit (HOSPITAL_COMMUNITY)
Admission: RE | Admit: 2015-03-15 | Discharge: 2015-03-15 | Disposition: A | Discharge status: Home / Self Care | Payer: Self-pay | Source: Ambulatory Visit | Attending: Thoracic Surgery (Cardiothoracic Vascular Surgery) | Admitting: Thoracic Surgery (Cardiothoracic Vascular Surgery)

## 2015-03-15 ENCOUNTER — Ambulatory Visit (HOSPITAL_COMMUNITY): Payer: Self-pay

## 2015-03-15 ENCOUNTER — Ambulatory Visit (HOSPITAL_COMMUNITY): Admission: RE | Admit: 2015-03-15 | Payer: Self-pay | Source: Ambulatory Visit

## 2015-03-15 ENCOUNTER — Encounter (HOSPITAL_COMMUNITY): Payer: Self-pay | Admitting: *Deleted

## 2015-03-15 DIAGNOSIS — R599 Enlarged lymph nodes, unspecified: Secondary | ICD-10-CM

## 2015-03-15 DIAGNOSIS — C8118 Nodular sclerosis classical Hodgkin lymphoma, lymph nodes of multiple sites: Secondary | ICD-10-CM | POA: Insufficient documentation

## 2015-03-15 DIAGNOSIS — R590 Localized enlarged lymph nodes: Secondary | ICD-10-CM

## 2015-03-15 DIAGNOSIS — F909 Attention-deficit hyperactivity disorder, unspecified type: Secondary | ICD-10-CM | POA: Insufficient documentation

## 2015-03-15 HISTORY — DX: Headache, unspecified: R51.9

## 2015-03-15 HISTORY — DX: Headache: R51

## 2015-03-15 HISTORY — PX: SCALENE NODE BIOPSY: SHX5446

## 2015-03-15 HISTORY — PX: SUPRACLAVICAL NODE BIOPSY: SHX5165

## 2015-03-15 HISTORY — DX: Pneumonia, unspecified organism: J18.9

## 2015-03-15 HISTORY — DX: Gastro-esophageal reflux disease without esophagitis: K21.9

## 2015-03-15 LAB — COMPREHENSIVE METABOLIC PANEL
ALBUMIN: 2.6 g/dL — AB (ref 3.5–5.0)
ALK PHOS: 151 U/L — AB (ref 38–126)
ALT: 27 U/L (ref 17–63)
AST: 23 U/L (ref 15–41)
Anion gap: 13 (ref 5–15)
BILIRUBIN TOTAL: 0.5 mg/dL (ref 0.3–1.2)
BUN: 5 mg/dL — AB (ref 6–20)
CALCIUM: 9.2 mg/dL (ref 8.9–10.3)
CO2: 23 mmol/L (ref 22–32)
CREATININE: 0.75 mg/dL (ref 0.61–1.24)
Chloride: 102 mmol/L (ref 101–111)
GFR calc Af Amer: >60 mL/min (ref >60)
GLUCOSE: 94 mg/dL (ref 65–99)
POTASSIUM: 4 mmol/L (ref 3.5–5.1)
Sodium: 138 mmol/L (ref 135–145)
TOTAL PROTEIN: 8.3 g/dL — AB (ref 6.5–8.1)

## 2015-03-15 LAB — PROTIME-INR
INR: 1.39 (ref 0.00–1.49)
PROTHROMBIN TIME: 17.1 s — AB (ref 11.6–15.2)

## 2015-03-15 LAB — SURGICAL PCR SCREEN
MRSA, PCR: NEGATIVE
Staphylococcus aureus: NEGATIVE

## 2015-03-15 LAB — CBC
HCT: 31.1 % — ABNORMAL LOW (ref 39.0–52.0)
HEMOGLOBIN: 9.4 g/dL — AB (ref 13.0–17.0)
MCH: 24 pg — AB (ref 26.0–34.0)
MCHC: 30.2 g/dL (ref 30.0–36.0)
MCV: 79.3 fL (ref 78.0–100.0)
Platelets: 523 10*3/uL — ABNORMAL HIGH (ref 150–400)
RBC: 3.92 MIL/uL — AB (ref 4.22–5.81)
RDW: 16 % — ABNORMAL HIGH (ref 11.5–15.5)
WBC: 18.2 10*3/uL — ABNORMAL HIGH (ref 4.0–10.5)

## 2015-03-15 LAB — APTT: aPTT: 36 seconds (ref 24–37)

## 2015-03-15 SURGERY — BIOPSY, LYMPH NODE, SUPRACLAVICULAR
Anesthesia: General | Laterality: Right

## 2015-03-15 MED ORDER — OXYCODONE HCL 5 MG PO TABS
ORAL_TABLET | ORAL | Status: AC
  Administered 2015-03-15: 10 mg via ORAL
  Filled 2015-03-15: qty 2

## 2015-03-15 MED ORDER — FENTANYL CITRATE (PF) 100 MCG/2ML IJ SOLN
INTRAMUSCULAR | Status: DC | PRN
  Administered 2015-03-15 (×2): 50 ug via INTRAVENOUS
  Administered 2015-03-15: 25 ug via INTRAVENOUS
  Administered 2015-03-15: 50 ug via INTRAVENOUS
  Administered 2015-03-15 (×2): 25 ug via INTRAVENOUS
  Administered 2015-03-15 (×2): 50 ug via INTRAVENOUS
  Administered 2015-03-15: 25 ug via INTRAVENOUS
  Administered 2015-03-15: 50 ug via INTRAVENOUS
  Administered 2015-03-15: 25 ug via INTRAVENOUS
  Administered 2015-03-15: 50 ug via INTRAVENOUS
  Administered 2015-03-15: 25 ug via INTRAVENOUS

## 2015-03-15 MED ORDER — FENTANYL CITRATE (PF) 250 MCG/5ML IJ SOLN
INTRAMUSCULAR | Status: AC
  Filled 2015-03-15: qty 5

## 2015-03-15 MED ORDER — SODIUM CHLORIDE 0.9 % IV SOLN: INTRAVENOUS | Status: DC

## 2015-03-15 MED ORDER — LACTATED RINGERS IV SOLN
INTRAVENOUS | Status: DC
  Administered 2015-03-15 (×2): via INTRAVENOUS

## 2015-03-15 MED ORDER — OXYCODONE HCL 5 MG PO TABS: 5.0000 mg | ORAL_TABLET | Freq: Once | ORAL | Status: DC | PRN

## 2015-03-15 MED ORDER — ONDANSETRON HCL 4 MG/2ML IJ SOLN
INTRAMUSCULAR | Status: DC | PRN
  Administered 2015-03-15: 4 mg via INTRAVENOUS

## 2015-03-15 MED ORDER — HYDROMORPHONE HCL 1 MG/ML IJ SOLN
INTRAMUSCULAR | Status: AC
  Administered 2015-03-15: 0.5 mg via INTRAVENOUS
  Filled 2015-03-15: qty 1

## 2015-03-15 MED ORDER — HYDROMORPHONE HCL 1 MG/ML IJ SOLN
0.2500 mg | INTRAMUSCULAR | Status: DC | PRN
  Administered 2015-03-15: 0.5 mg via INTRAVENOUS

## 2015-03-15 MED ORDER — LIDOCAINE HCL (CARDIAC) 20 MG/ML IV SOLN
INTRAVENOUS | Status: AC
  Filled 2015-03-15: qty 5

## 2015-03-15 MED ORDER — MUPIROCIN 2 % EX OINT
TOPICAL_OINTMENT | CUTANEOUS | Status: AC
  Filled 2015-03-15: qty 22

## 2015-03-15 MED ORDER — VANCOMYCIN HCL IN DEXTROSE 1-5 GM/200ML-% IV SOLN
1000.0000 mg | INTRAVENOUS | Status: DC
  Filled 2015-03-15: qty 200

## 2015-03-15 MED ORDER — ONDANSETRON HCL 4 MG/2ML IJ SOLN
INTRAMUSCULAR | Status: AC
  Filled 2015-03-15: qty 2

## 2015-03-15 MED ORDER — OXYCODONE HCL 5 MG PO TABS
5.0000 mg | ORAL_TABLET | Freq: Four times a day (QID) | ORAL | Status: DC | PRN
  Administered 2015-03-15: 10 mg via ORAL

## 2015-03-15 MED ORDER — PROPOFOL 10 MG/ML IV BOLUS
INTRAVENOUS | Status: AC
  Filled 2015-03-15: qty 20

## 2015-03-15 MED ORDER — LIDOCAINE-EPINEPHRINE (PF) 1 %-1:200000 IJ SOLN
INTRAMUSCULAR | Status: AC
  Filled 2015-03-15: qty 30

## 2015-03-15 MED ORDER — LIDOCAINE HCL (CARDIAC) 20 MG/ML IV SOLN
INTRAVENOUS | Status: DC | PRN
  Administered 2015-03-15: 40 mg via INTRAVENOUS

## 2015-03-15 MED ORDER — PROPOFOL 10 MG/ML IV BOLUS
INTRAVENOUS | Status: DC | PRN
  Administered 2015-03-15: 190 mg via INTRAVENOUS

## 2015-03-15 MED ORDER — ONDANSETRON HCL 4 MG/2ML IJ SOLN: 4.0000 mg | Freq: Once | INTRAMUSCULAR | Status: DC | PRN

## 2015-03-15 MED ORDER — PHENYLEPHRINE HCL 10 MG/ML IJ SOLN
INTRAMUSCULAR | Status: DC | PRN
  Administered 2015-03-15: 80 ug via INTRAVENOUS
  Administered 2015-03-15: 40 ug via INTRAVENOUS
  Administered 2015-03-15 (×2): 80 ug via INTRAVENOUS
  Administered 2015-03-15: 40 ug via INTRAVENOUS
  Administered 2015-03-15: 80 ug via INTRAVENOUS
  Administered 2015-03-15 (×6): 40 ug via INTRAVENOUS
  Administered 2015-03-15: 80 ug via INTRAVENOUS
  Administered 2015-03-15: 40 ug via INTRAVENOUS

## 2015-03-15 MED ORDER — OXYCODONE HCL 5 MG PO TABS: 5.0000 mg | ORAL_TABLET | Freq: Four times a day (QID) | ORAL | Status: DC | PRN

## 2015-03-15 MED ORDER — MUPIROCIN 2 % EX OINT
1.0000 "application " | TOPICAL_OINTMENT | Freq: Once | CUTANEOUS | Status: AC
  Administered 2015-03-15: 1 via TOPICAL

## 2015-03-15 MED ORDER — MIDAZOLAM HCL 5 MG/5ML IJ SOLN
INTRAMUSCULAR | Status: DC | PRN
  Administered 2015-03-15: 2 mg via INTRAVENOUS

## 2015-03-15 MED ORDER — BUPIVACAINE HCL (PF) 0.5 % IJ SOLN
INTRAMUSCULAR | Status: DC | PRN
  Administered 2015-03-15: 10 mL

## 2015-03-15 MED ORDER — MIDAZOLAM HCL 2 MG/2ML IJ SOLN
INTRAMUSCULAR | Status: AC
  Filled 2015-03-15: qty 2

## 2015-03-15 MED ORDER — VANCOMYCIN HCL 1000 MG IV SOLR
1000.0000 mg | INTRAVENOUS | Status: DC | PRN
  Administered 2015-03-15: 1000 mg via INTRAVENOUS

## 2015-03-15 MED ORDER — OXYCODONE HCL 5 MG/5ML PO SOLN: 5.0000 mg | Freq: Once | ORAL | Status: DC | PRN

## 2015-03-15 MED ORDER — PHENYLEPHRINE 40 MCG/ML (10ML) SYRINGE FOR IV PUSH (FOR BLOOD PRESSURE SUPPORT)
PREFILLED_SYRINGE | INTRAVENOUS | Status: AC
  Filled 2015-03-15: qty 10

## 2015-03-15 MED ORDER — BUPIVACAINE HCL (PF) 0.5 % IJ SOLN
INTRAMUSCULAR | Status: AC
  Filled 2015-03-15: qty 10

## 2015-03-15 SURGICAL SUPPLY — 38 items
CANISTER SUCTION 2500CC (MISCELLANEOUS) ×3 IMPLANT
CLIP TI MEDIUM 6 (CLIP) ×3 IMPLANT
CLIP TI WIDE RED SMALL 6 (CLIP) ×6 IMPLANT
CONT SPEC 4OZ CLIKSEAL STRL BL (MISCELLANEOUS) ×6 IMPLANT
COVER SURGICAL LIGHT HANDLE (MISCELLANEOUS) ×3 IMPLANT
DERMABOND ADVANCED (GAUZE/BANDAGES/DRESSINGS) ×2
DERMABOND ADVANCED .7 DNX12 (GAUZE/BANDAGES/DRESSINGS) ×1 IMPLANT
DRAPE CHEST BREAST 15X10 FENES (DRAPES) ×3 IMPLANT
ELECT CAUTERY BLADE 6.4 (BLADE) ×3 IMPLANT
ELECT REM PT RETURN 9FT ADLT (ELECTROSURGICAL) ×3
ELECTRODE REM PT RTRN 9FT ADLT (ELECTROSURGICAL) ×1 IMPLANT
GAUZE SPONGE 4X4 12PLY STRL (GAUZE/BANDAGES/DRESSINGS) IMPLANT
GLOVE BIOGEL PI IND STRL 6.5 (GLOVE) ×1 IMPLANT
GLOVE BIOGEL PI INDICATOR 6.5 (GLOVE) ×2
GLOVE SURG SIGNA 7.5 PF LTX (GLOVE) ×3 IMPLANT
GLOVE SURG SS PI 7.0 STRL IVOR (GLOVE) ×3 IMPLANT
GOWN STRL REUS W/ TWL XL LVL3 (GOWN DISPOSABLE) ×2 IMPLANT
GOWN STRL REUS W/TWL XL LVL3 (GOWN DISPOSABLE) ×4
HEMOSTAT SURGICEL 2X14 (HEMOSTASIS) IMPLANT
KIT BASIN OR (CUSTOM PROCEDURE TRAY) ×3 IMPLANT
KIT ROOM TURNOVER OR (KITS) ×3 IMPLANT
NEEDLE 22X1 1/2 (OR ONLY) (NEEDLE) IMPLANT
NS IRRIG 1000ML POUR BTL (IV SOLUTION) ×3 IMPLANT
PACK GENERAL/GYN (CUSTOM PROCEDURE TRAY) ×3 IMPLANT
PAD ARMBOARD 7.5X6 YLW CONV (MISCELLANEOUS) ×6 IMPLANT
SPONGE INTESTINAL PEANUT (DISPOSABLE) ×3 IMPLANT
SUT SILK 2 0 (SUTURE) ×2
SUT SILK 2-0 18XBRD TIE 12 (SUTURE) ×1 IMPLANT
SUT VIC AB 2-0 CT1 27 (SUTURE)
SUT VIC AB 2-0 CT1 TAPERPNT 27 (SUTURE) IMPLANT
SUT VIC AB 3-0 SH 27 (SUTURE) ×2
SUT VIC AB 3-0 SH 27X BRD (SUTURE) ×1 IMPLANT
SUT VIC AB 3-0 X1 27 (SUTURE) IMPLANT
SUT VICRYL 4-0 PS2 18IN ABS (SUTURE) ×3 IMPLANT
SYR CONTROL 10ML LL (SYRINGE) IMPLANT
TOWEL OR 17X24 6PK STRL BLUE (TOWEL DISPOSABLE) IMPLANT
TOWEL OR 17X26 10 PK STRL BLUE (TOWEL DISPOSABLE) ×3 IMPLANT
WATER STERILE IRR 1000ML POUR (IV SOLUTION) IMPLANT

## 2015-03-15 NOTE — Anesthesia Postprocedure Evaluation (Signed)
Anesthesia Post Note  Patient: Jeffery Crane  Procedure(s) Performed: Procedure(s) (LRB): RIGHT SUPRACLAVICAL/CERVICAL LYMPH NODE BIOPSY (Right) BIOPSY SCALENE NODE (Right)  Patient location during evaluation: PACU Anesthesia Type: General Level of consciousness: awake and alert Pain management: pain level controlled Vital Signs Assessment: post-procedure vital signs reviewed and stable Respiratory status: spontaneous breathing, nonlabored ventilation and respiratory function stable Cardiovascular status: blood pressure returned to baseline and stable Postop Assessment: no signs of nausea or vomiting Anesthetic complications: no    Last Vitals:  Filed Vitals:   03/15/15 1200 03/15/15 1208  BP:  115/70  Pulse: 103 98  Temp:    Resp: 12     Last Pain:  Filed Vitals:   03/15/15 1212  PainSc: 0-No pain                 Teng Decou,W. EDMOND

## 2015-03-15 NOTE — Transfer of Care (Signed)
Immediate Anesthesia Transfer of Care Note  Patient: Jeffery Crane  Procedure(s) Performed: Procedure(s): RIGHT SUPRACLAVICAL/CERVICAL LYMPH NODE BIOPSY (Right) BIOPSY SCALENE NODE (Right)  Patient Location: PACU  Anesthesia Type:General  Level of Consciousness: awake, alert  and oriented  Airway & Oxygen Therapy: Patient Spontanous Breathing and Patient connected to nasal cannula oxygen  Post-op Assessment: Report given to RN and Post -op Vital signs reviewed and stable  Post vital signs: Reviewed and stable  Last Vitals:  Filed Vitals:   03/15/15 0715  BP: 115/52  Pulse: 108  Temp: 37.3 C  Resp: 20    Complications: No apparent anesthesia complications

## 2015-03-15 NOTE — Brief Op Note (Signed)
03/15/2015  11:07 AM  PATIENT:  Jeffery Crane  20 y.o. male  PRE-OPERATIVE DIAGNOSIS:  RIGHT CERVICAL SUPRACLAVICULAR ADENOPATHY  POST-OPERATIVE DIAGNOSIS:  RIGHT CERVICAL SUPRACLAVICULAR ADENOPATHY  PROCEDURE:  Procedure(s): RIGHT SUPRACLAVICAL/CERVICAL LYMPH NODE BIOPSY (Right) BIOPSY SCALENE NODE (Right)  SURGEON:  Surgeon(s) and Role:    * [name], MD - Primary   ANESTHESIA:   general  EBL:  Total I/O In: 1400 [I.V.:1400] Out: -   BLOOD ADMINISTERED:none  DRAINS: none   LOCAL MEDICATIONS USED:  MARCAINE    and Amount: 10 ml  SPECIMEN:  Source of Specimen:  cervical and supraclavicular lymph nodes  DISPOSITION OF SPECIMEN:  PATHOLOGY  COUNTS:  YES  PLAN OF CARE: Discharge to home after PACU  PATIENT DISPOSITION:  PACU - hemodynamically stable.   Delay start of Pharmacological VTE agent (>24hrs) due to surgical blood loss or risk of bleeding: not applicable

## 2015-03-15 NOTE — Interval H&P Note (Signed)
History and Physical Interval Note:  03/15/2015 9:17 AM  Jeffery Crane  has presented today for surgery, with the diagnosis of CERVICAL SUPRACLAVICULAR LYMPH NODE  The various methods of treatment have been discussed with the patient and family. After consideration of risks, benefits and other options for treatment, the patient has consented to  Procedure(s): RIGHT SUPRACLAVICAL/CERVICAL LYMPH NODE BIOPSY (Right) as a surgical intervention .  The patient's history has been reviewed, patient examined, no change in status, stable for surgery.  I have reviewed the patient's chart and labs.  Questions were answered to the patient's satisfaction.     [name]

## 2015-03-15 NOTE — Anesthesia Procedure Notes (Signed)
Procedure Name: LMA Insertion Date/Time: 03/15/2015 9:56 AM Performed by: Clearnce Sorrel Pre-anesthesia Checklist: Patient identified, Timeout performed, Emergency Drugs available, Suction available and Patient being monitored Patient Re-evaluated:Patient Re-evaluated prior to inductionOxygen Delivery Method: Circle system utilized Preoxygenation: Pre-oxygenation with 100% oxygen Intubation Type: IV induction LMA: LMA inserted LMA Size: 5.0 Tube type: Oral Number of attempts: 1 Placement Confirmation: positive ETCO2 and breath sounds checked- equal and bilateral Tube secured with: Tape Dental Injury: Teeth and Oropharynx as per pre-operative assessment

## 2015-03-15 NOTE — H&P (View-Only) (Signed)
[address]       ,[place] [address]             [phone]       HPI: Jeffery Crane returns today for follow-up of his right upper lobe lesion and adenopathy.  Jeffery Crane is a 20 year old young man who was admitted to [facility] in January with fevers, chills and a productive cough. A CT of the chest showed a cavitary right upper lobe lesion. There also was hilar and mediastinal adenopathy. He was treated for presumed lung abscess. However, there was concern that this could be a neoplastic process.  He had to stop amoxicillin after he had been home a few days due to a rash. He currently is taking levofloxacin. He is tolerating that well. He is still having night sweats, but are not aware of any fevers. He has lost about 15 pounds in the past month. He also has chest discomfort when he tries to lie flat on his back.  Past Medical History  Diagnosis Date  . Adult ADHD       Current Outpatient Prescriptions  Medication Sig Dispense Refill  . gabapentin (NEURONTIN) 100 MG capsule Take 1 capsule (100 mg total) by mouth 3 (three) times daily. 90 capsule 0  . levofloxacin (LEVAQUIN) 500 MG tablet Take 1 tablet (500 mg total) by mouth daily. take entire course 21 tablet 0  . meloxicam (MOBIC) 15 MG tablet Take 15 mg by mouth daily. Prn     No current facility-administered medications for this visit.    Physical Exam BP 125/77 mmHg  Pulse 100  Resp 20  Ht 5\' 9"  (1.753 m)  Wt 196 lb (88.905 kg)  BMI 28.93 kg/m2  SpO17 23% 20 year old man in no acute distress Anxious Alert and oriented 3 with no focal neurologic deficits Palpable right anterior cervical and scalene lymph nodes, nontender Cardiac regular rate and rhythm normal S1 and S2 no rubs murmurs or gallops Lungs clear with equal breath sounds bilaterally Abdomen soft nontender Extremities without clubbing cyanosis or edema  Diagnostic Tests: CT CHEST WITHOUT CONTRAST  TECHNIQUE: Multidetector CT  imaging of the chest was performed following the standard protocol without IV contrast.  COMPARISON: 01/19/2015  FINDINGS: Mediastinum / Lymph Nodes: The large anteromedial right upper lobe and mediastinal mass is again noted. The dominant lesion which appears to invade anterior mediastinum measures 5.6 x 3.6 cm today compared to 5.2 x 4.2 cm previously adjacent thick-walled cavitary lesions in the medial right upper lobe persists. In the interval since the prior study, the patient has developed substantial mediastinal lymphadenopathy. 2.3 cm short axis prevascular lymph node is new in the interval. 3.4 cm short axis subcarinal lymph node was 1.2 cm previously. 1.9 cm right paratracheal lymph node was 1.8 cm previously.  Lungs / Pleura: Cavitary lung lesions with satellite nodules in the anterior right upper lobe persists without substantial interval change. These are quite thick walled with the wall in some of these cavitary areas measuring up to 9-10 mm. Right lower lobe and left lung are clear. No pleural effusion. Trace pericardial effusion is new in the interval.  Upper Abdomen: Unremarkable.  MSK / Soft Tissues: Bone windows reveal no worrisome lytic or sclerotic osseous lesions.  IMPRESSION: No substantial interval change in the anteromedial right upper lobe cavitary mass-like lesion which appears to involve the mediastinum anteriorly. However, has been interval development of bulky prevascular and subcarinal lymphadenopathy. The degree of lymph node progression  in 5 weeks and lack of any discernible improvement/clearing is concerning. As such, neoplastic etiology, such as lymphoma, becomes end of higher concern.  I discussed the results of this study with Dr. Elsworth Soho, who was covering for Dr. Vaughan Browner, on the afternoon of 02/28/2014.   Electronically Signed  By: Misty Stanley M.D.  On: 03/01/2015 15:41  I personally reviewed the CT chest and her with  findings noted above. There is been progression of his adenopathy. This is highly suspicious for lymphoma rather than an infectious process.  Impression: 20 year old man with a right upper lobe mass and mediastinal adenopathy. There has been progression over the past month. 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 I highly doubt it is an infectious process. It is possible he did have a secondary bacterial infection first admitted. The signs and symptoms and CT findings are all consistent with lymphoma.  He needs a lymph node biopsy to confirm the diagnosis. I think were more likely to get a definitive diagnosis with a biopsy of the right cervical and supraclavicular lymph nodes then we are with a CT-guided needle biopsy. I recommended to him that we do this in the operating room under local anesthesia with intravenous sedation. I described the procedure to him. We should be able to get to 2 nodes through the same incision. This will be done on an outpatient basis. He may need to be out of work for a few days, but could go back earlier if he feels up to it. I informed him of the indications, risks, benefits, and alternatives. He understands this is diagnostic and not therapeutic. He understands the risks include, but are not limited to adverse reaction to medication, bleeding, infection, as well as the possibility of other unforeseeable complications. He does understand the possibility of the lymph nodes may be nondiagnostic, and if so he would need an additional biopsy procedure later date.   Plan: Right cervical and supraclavicular lymph node biopsy on Monday, 03/15/2015.  I spent 15 minutes with Jeffery Crane during this visit, greater than 50% of time was spent in counseling. [name], MD [facility] [phone]

## 2015-03-15 NOTE — Anesthesia Preprocedure Evaluation (Signed)
Anesthesia Evaluation   Patient awake    Reviewed: Allergy & Precautions, NPO status , Patient's Chart, lab work & pertinent test results  Airway Mallampati: II  TM Distance: >3 FB Neck ROM: Full    Dental  (+) Teeth Intact, Dental Advisory Given   Pulmonary    breath sounds clear to auscultation       Cardiovascular  Rhythm:Regular Rate:Normal     Neuro/Psych    GI/Hepatic   Endo/Other    Renal/GU      Musculoskeletal   Abdominal   Peds  Hematology   Anesthesia Other Findings   Reproductive/Obstetrics                             Anesthesia Physical Anesthesia Plan  ASA: III  Anesthesia Plan: General   Post-op Pain Management:    Induction: Intravenous  Airway Management Planned: LMA  Additional Equipment:   Intra-op Plan:   Post-operative Plan:   Informed Consent: I have reviewed the patients History and Physical, chart, labs and discussed the procedure including the risks, benefits and alternatives for the proposed anesthesia with the patient or authorized representative who has indicated his/her understanding and acceptance.   Dental advisory given  Plan Discussed with: CRNA and Anesthesiologist  Anesthesia Plan Comments:         Anesthesia Quick Evaluation

## 2015-03-15 NOTE — Discharge Instructions (Signed)
Do not drive or engage in heavy physical activity for 24 hours.  You may begin driving when you are no longer having to take the prescription pain medication.  You may shower tomorrow  You have a prescription for oxycodone, a narcotic pain reliever, you may use as directed  You may use Tylenol or ibuprofen in addition to, or instead of, the oxycodone  Call [phone] if you have a fever > 101 F, or excessive redness, drainage or swelling at the incision   Lymphadenopathy Lymphadenopathy refers to swollen or enlarged lymph glands, also called lymph nodes. Lymph glands are part of your body's defense (immune) system, which protects the body from infections, germs, and diseases. Lymph glands are found in many locations in your body, including the neck, underarm, and groin.  Many things can cause lymph glands to become enlarged. When your immune system responds to germs, such as viruses or bacteria, infection-fighting cells and fluid build up. This causes the glands to grow in size. Usually, this is not something to worry about. The swelling and any soreness often go away without treatment. However, swollen lymph glands can also be caused by a number of diseases. Your health care provider may do various tests to help determine the cause. If the cause of your swollen lymph glands cannot be found, it is important to monitor your condition to make sure the swelling goes away. HOME CARE INSTRUCTIONS Watch your condition for any changes. The following actions may help to lessen any discomfort you are feeling:  Get plenty of rest.  Take medicines only as directed by your health care provider. Your health care provider may recommend over-the-counter medicines for pain.  Apply moist heat compresses to the site of swollen lymph nodes as directed by your health care provider. This can help reduce any pain.  Check your lymph nodes daily for any changes.  Keep all follow-up visits as directed by your  health care provider. This is important. SEEK MEDICAL CARE IF:  Your lymph nodes are still swollen after 2 weeks.  Your swelling increases or spreads to other areas.  Your lymph nodes are hard, seem fixed to the skin, or are growing rapidly.  Your skin over the lymph nodes is red and inflamed.  You have a fever.  You have chills.  You have fatigue.  You develop a sore throat.  You have abdominal pain.  You have weight loss.  You have night sweats. SEEK IMMEDIATE MEDICAL CARE IF:  You notice fluid leaking from the area of the enlarged lymph node.  You have severe pain in any area of your body.  You have chest pain.  You have shortness of breath.   This information is not intended to replace advice given to you by your health care provider. Make sure you discuss any questions you have with your health care provider.   Document Released: 09/28/2007 Document Revised: 01/09/2014 Document Reviewed: 07/24/2013 Elsevier Interactive Patient Education [employer].

## 2015-03-16 ENCOUNTER — Encounter (HOSPITAL_COMMUNITY): Payer: Self-pay | Admitting: Thoracic Surgery (Cardiothoracic Vascular Surgery)

## 2015-03-16 ENCOUNTER — Inpatient Hospital Stay: Payer: Self-pay | Admitting: Pulmonary Disease

## 2015-03-16 NOTE — Op Note (Signed)
[name], [name] NO.:  [id number]  MEDICAL RECORD NO.:  [id number]  LOCATION:  [facility]                         FACILITY:  [facility]  PHYSICIAN:  Revonda Standard. Roxan Hockey, M.D.DATE OF BIRTH:  02-24-1995  DATE OF PROCEDURE:  03/15/2015 DATE OF DISCHARGE:  03/15/2015                              OPERATIVE REPORT   PREOPERATIVE DIAGNOSIS:  Cervical and supraclavicular adenopathy.  POSTOPERATIVE DIAGNOSIS:  Cervical and supraclavicular adenopathy.  PROCEDURE:  Biopsy of right supraclavicular and cervical lymph nodes.  SURGEON:  Revonda Standard. Roxan Hockey, M.D.  ASSISTANT:  None.  ANESTHESIA:  General.  FINDINGS:  Enlarged cervical and supraclavicular lymph nodes.  Frozen section showed extensive lymphoid aggregate.  No definite tumor seen.  CLINICAL NOTE:  Jeffery Crane is a 20 year old gentleman, who originally presented with fever and cough and was diagnosed with a presumed pneumonia and lung abscess.  A followup CT showed progression of right upper lobe mass, mediastinal and supraclavicular adenopathy.  He was advised to undergo cervical and supraclavicular lymph node biopsy for diagnostic purposes.  The indications, risks, benefits, and alternatives were discussed in detail with the patient.  He understood and accepted the risks and agreed to proceed.  OPERATIVE NOTE:  Jeffery Crane was brought to the operating room on March 15, 2015.  He had induction of general anesthesia, administered via laryngeal mask airway.  The right neck and upper chest were prepped and draped in the usual sterile fashion.  An incision was made at the base of the neck, it was carried through the skin and subcutaneous tissue. Hemostasis was achieved with cautery.  The platysmal layer was divided. There were multiple palpable enlarged lymph nodes.  The first node that was encountered was a cervical node, it was approximately 1 cm in diameter.  There was fibrosis and inflammation around the node,  it was dissected out and removed and sent for frozen section.  While awaiting that result, a larger supraclavicular node was identified.  It likewise was dissected out and removed.  It was sent for permanent pathology only.  Hemostasis was achieved.  The frozen section returned showing extensive lymphoid aggregate.  No definite tumor seen.  The wound was copiously irrigated with saline.  The platysmal layer was closed with running 3-0 Vicryl suture and the skin was closed with 4-0 Vicryl subcuticular suture.  Dermabond was applied.  The patient was extubated in the operating room, taken to the postanesthetic care unit in good condition.     Revonda Standard Roxan Hockey, M.D.     SCH/MEDQ  D:  03/15/2015  T:  03/16/2015  Job:  [id number]

## 2015-03-18 ENCOUNTER — Telehealth: Payer: Self-pay | Admitting: *Deleted

## 2015-03-18 NOTE — Telephone Encounter (Signed)
Oncology Nurse Navigator Documentation  Oncology Nurse Navigator Flowsheets 03/18/2015  Navigator Encounter Type Telephone/I received a referral from Dr. Leonarda Salon office.  I called to schedule but was unable to reach.  I left a vm message asking him to call me with my name and phone number  Treatment Phase Pre-Tx/Tx Discussion  Barriers/Navigation Needs Coordination of Care  Interventions Coordination of Care  Coordination of Care Appts  Acuity Level 1  Time Spent with Patient 15

## 2015-03-19 ENCOUNTER — Telehealth: Payer: Self-pay | Admitting: *Deleted

## 2015-03-19 DIAGNOSIS — D72829 Elevated white blood cell count, unspecified: Secondary | ICD-10-CM

## 2015-03-19 NOTE — Telephone Encounter (Signed)
Oncology Nurse Navigator Documentation  Oncology Nurse Navigator Flowsheets 03/19/2015  Navigator Location [facility]  Navigator Encounter Type Telephone  Telephone Outgoing Call  Abnormal Finding Date 01/18/2015  Confirmed Diagnosis Date 03/15/2015  Treatment Phase Pre-Tx/Tx Discussion  Barriers/Navigation Needs Coordination of Care  Interventions Coordination of Care  Coordination of Care Appts  Acuity Level 1  Time Spent with Patient 15   Received referral yesterday from Dr. Leonarda Salon office.  I called yesterday but was unable to reach.  I called today and was able to speak with patient.  Per Dr. Julien Nordmann, I scheduled patient to be seen on 03/26/15 arrive at 9:00 with labs at 9:15 and Dr. Julien Nordmann at 9:30.  He verbalized understanding of appt time and place.

## 2015-03-26 ENCOUNTER — Telehealth: Payer: Self-pay | Admitting: Internal Medicine

## 2015-03-26 ENCOUNTER — Encounter: Payer: Self-pay | Admitting: Internal Medicine

## 2015-03-26 ENCOUNTER — Ambulatory Visit (HOSPITAL_BASED_OUTPATIENT_CLINIC_OR_DEPARTMENT_OTHER): Payer: Self-pay

## 2015-03-26 ENCOUNTER — Ambulatory Visit (HOSPITAL_BASED_OUTPATIENT_CLINIC_OR_DEPARTMENT_OTHER): Payer: Self-pay | Admitting: Internal Medicine

## 2015-03-26 VITALS — BP 109/65 | HR 110 | Temp 98.2°F | Resp 18 | Wt 169.5 lb

## 2015-03-26 DIAGNOSIS — R918 Other nonspecific abnormal finding of lung field: Secondary | ICD-10-CM

## 2015-03-26 DIAGNOSIS — R599 Enlarged lymph nodes, unspecified: Secondary | ICD-10-CM

## 2015-03-26 DIAGNOSIS — C8112 Nodular sclerosis classical Hodgkin lymphoma, intrathoracic lymph nodes: Secondary | ICD-10-CM | POA: Insufficient documentation

## 2015-03-26 DIAGNOSIS — D72829 Elevated white blood cell count, unspecified: Secondary | ICD-10-CM

## 2015-03-26 DIAGNOSIS — R05 Cough: Secondary | ICD-10-CM

## 2015-03-26 HISTORY — DX: Nodular sclerosis Hodgkin lymphoma, intrathoracic lymph nodes: C81.12

## 2015-03-26 LAB — CBC WITH DIFFERENTIAL/PLATELET
BASO%: 0.2 % (ref 0.0–2.0)
BASOS ABS: 0.1 10*3/uL (ref 0.0–0.1)
EOS ABS: 0.1 10*3/uL (ref 0.0–0.5)
EOS%: 0.6 % (ref 0.0–7.0)
HEMATOCRIT: 26.4 % — AB (ref 38.4–49.9)
HGB: 8.3 g/dL — ABNORMAL LOW (ref 13.0–17.1)
LYMPH#: 1.4 10*3/uL (ref 0.9–3.3)
LYMPH%: 7 % — ABNORMAL LOW (ref 14.0–49.0)
MCH: 24 pg — ABNORMAL LOW (ref 27.2–33.4)
MCHC: 31.5 g/dL — AB (ref 32.0–36.0)
MCV: 76.1 fL — AB (ref 79.3–98.0)
MONO#: 1.5 10*3/uL — AB (ref 0.1–0.9)
MONO%: 7.2 % (ref 0.0–14.0)
NEUT#: 17.4 10*3/uL — ABNORMAL HIGH (ref 1.5–6.5)
NEUT%: 85 % — AB (ref 39.0–75.0)
PLATELETS: 485 10*3/uL — AB (ref 140–400)
RBC: 3.47 10*6/uL — ABNORMAL LOW (ref 4.20–5.82)
RDW: 16.9 % — ABNORMAL HIGH (ref 11.0–14.6)
WBC: 20.4 10*3/uL — ABNORMAL HIGH (ref 4.0–10.3)

## 2015-03-26 LAB — LACTATE DEHYDROGENASE: LDH: 205 U/L (ref 125–245)

## 2015-03-26 LAB — COMPREHENSIVE METABOLIC PANEL
ALT: 21 U/L (ref 0–55)
ANION GAP: 9 meq/L (ref 3–11)
AST: 20 U/L (ref 5–34)
Albumin: 2.3 g/dL — ABNORMAL LOW (ref 3.5–5.0)
Alkaline Phosphatase: 172 U/L — ABNORMAL HIGH (ref 40–150)
BILIRUBIN TOTAL: 0.37 mg/dL (ref 0.20–1.20)
BUN: 5.5 mg/dL — ABNORMAL LOW (ref 7.0–26.0)
CALCIUM: 9.1 mg/dL (ref 8.4–10.4)
CHLORIDE: 100 meq/L (ref 98–109)
CO2: 27 mEq/L (ref 22–29)
CREATININE: 0.8 mg/dL (ref 0.7–1.3)
Glucose: 104 mg/dl (ref 70–140)
Potassium: 3.7 mEq/L (ref 3.5–5.1)
Sodium: 137 mEq/L (ref 136–145)
TOTAL PROTEIN: 7.9 g/dL (ref 6.4–8.3)

## 2015-03-26 MED ORDER — ALLOPURINOL 100 MG PO TABS: 100.0000 mg | ORAL_TABLET | Freq: Two times a day (BID) | ORAL | Status: DC

## 2015-03-26 MED ORDER — HYDROCODONE-HOMATROPINE 5-1.5 MG/5ML PO SYRP: 5.0000 mL | ORAL_SOLUTION | Freq: Four times a day (QID) | ORAL | Status: DC | PRN

## 2015-03-26 MED ORDER — PROCHLORPERAZINE MALEATE 10 MG PO TABS: 10.0000 mg | ORAL_TABLET | Freq: Four times a day (QID) | ORAL | Status: DC | PRN

## 2015-03-26 NOTE — Telephone Encounter (Signed)
no pof in sent pt back to lab per orders in system

## 2015-03-26 NOTE — Telephone Encounter (Signed)
per pof to sch pt appt-sch ECHO no precert no ins-sent MW email to sch trmt--gave pt copy of avs

## 2015-03-26 NOTE — Progress Notes (Signed)
Pt's mom came in to inquire about FA for patient during patient check in. I asked if patient had applied for Medicaid. She states he had to have a certain amount of medical bills. She wanted to know what financial assistance may be available. I advised her that uninsured patient's receive a 55% discount for services but if eligible for any additional discount an application would have to be completed and submitted. I reviewed patient's balance and he does have bills that can possibly be submitted. Patient came to get mom to go to appointment. I have pulled hospital itemized bills with a balance and transaction inquiries for [facility] side. I also reviewed hospital account notes due to patient having a recent admission. Patient was screened by Financial counseling and according to note was non-compliant with financial assistance process for hospital discount(this automatically disqualifies him for an additional discount and cannot reapply for 6 months per policy).

## 2015-03-26 NOTE — Progress Notes (Signed)
Pt's mom stopped by to pick up information I had pulled for her. Also, discussed the FA policy once denied. Advised mom to have patient complete Medicaid application and they may return to DSS or bring here for Lenise or myself to fax.

## 2015-03-26 NOTE — Progress Notes (Signed)
[place] [phone]   [phone]  CONSULT NOTE  REFERRING PHYSICIAN: Dr. Modesto Charon  REASON FOR CONSULTATION:  20 years old African-American male recently diagnosed with Hodgkin lymphoma.  HPI Jeffery Crane is a 20 y.o. male with no significant past medical history. The patient mentions that he has been complaining of cough and night sweats since September 2016. He ignored it for a few months but in early January 2017 he presented to the hospital for evaluation and chest x-ray was performed on 01/18/2015 and it showed findings concerning for cavitary pneumonia in the right upper lobe with associated right hilar and right paratracheal lymphadenopathy. CT scan of the chest with contrast was performed on 01/19/2015 and it showed a thick walled, rounded component with an irregular central cavitary component. This thick-walled component is located superiorly and anteriorly and measures 3.7 x 3.2 cm. More inferiorly and medially, this has a solid component extending into the anterior mediastinum. This solid component measures 5.3 x 4.2 cm. The cavitary and solid appearing component combined measure 8.7 x 6.6 cm in maximum diameter, including extension into the mediastinum. There was also a triangular area of dense consolidation in the right upper lobe contains a thin walled cavitary component laterality containing an air-fluid level and measured 2.7 x 2.4 cm. The scan also showed multiple enlarged mediastinal and right hilar lymph nodes. The largest node is a right paratracheal node with short axis of 2.8 cm and right hilar node has a short axis of 1.3 cm. The patient was treated with a course of antibiotics initially with amoxicillin followed by Levaquin with no improvement in his condition. Repeat CT scan of the chest without contrast on 03/01/2015 showed no substantial interval change in the anterior medial right upper lobe cavitary mass like lesion which  appears to involve the mediastinum anteriorly. There has been interval development of bulky prevascular and subcarinal lymphadenopathy. The findings were concerning for neoplastic etiology such as lymphoma.  The patient was referred to Dr. Roxan Hockey and on 03/15/2015 he underwent biopsy of the right supraclavicular and cervical lymph node. The final pathology (Accession: [id number]) was consistent with classical Hodgkin lymphoma, nodular sclerosis subtype. Dr. Roxan Hockey kindly referred the patient to me today for evaluation and recommendation regarding treatment of his condition. When seen today the patient continues to complain of increasing fatigue and weakness as well as cough productive of greenish sputum but no significant chest pain or shortness breath. He also a lump in the right neck area and under his arms. He start having more itching 4 weeks ago. He lost around 16 pounds in the last 6 months. He has occasional nausea and vomiting especially with cough and certain foods. He denied having any headache or visual changes. Family history mother has anemia, hypertension and fibroid tumor. Father is healthy The patient is single and has one son age 57. He was accompanied today by his mother Jeffery Crane. He works at a [employer]. He has no history of smoking but drinks alcohol occasionally and no history of drug abuse.  HPI  Past Medical History  Diagnosis Date  . Adult ADHD   . Pneumonia   . Headache   . GERD (gastroesophageal reflux disease)   . 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 Video bronchoscopy Bilateral 01/22/2015    Procedure: VIDEO BRONCHOSCOPY WITH FLUORO;  Surgeon: Marshell Garfinkel, MD;  Location: [facility]  ENDOSCOPY;  Service: Cardiopulmonary;  Laterality: Bilateral;  . Supraclavical node biopsy Right 03/15/2015    Procedure: RIGHT SUPRACLAVICAL/CERVICAL LYMPH NODE BIOPSY;   Surgeon: [name], MD;  Location: [place];  Service: Thoracic;  Laterality: Right;  . Scalene node biopsy Right 03/15/2015    Procedure: BIOPSY SCALENE NODE;  Surgeon: [name], MD;  Location: [place];  Service: Thoracic;  Laterality: Right;    History reviewed. No pertinent family history.  Social History Social History  Substance Use Topics  . Smoking status: Never Smoker   . Smokeless tobacco: Never Used  . Alcohol Use: Yes     Comment: rare    Allergies  Allergen Reactions  . Amoxicillin-Pot Clavulanate Rash  . Bactrim Other (See Comments)    Unknown children reaction per pt's mom    Current Outpatient Prescriptions  Medication Sig Dispense Refill  . gabapentin (NEURONTIN) 100 MG capsule Take 1 capsule (100 mg total) by mouth 3 (three) times daily. 90 capsule 0  . oxyCODONE (OXY IR/ROXICODONE) 5 MG immediate release tablet Take 1-2 tablets (5-10 mg total) by mouth every 6 (six) hours as needed (pain). 30 tablet 0  . allopurinol (ZYLOPRIM) 100 MG tablet Take 1 tablet (100 mg total) by mouth 2 (two) times daily. 60 tablet 3  . HYDROcodone-homatropine (HYCODAN) 5-1.5 MG/5ML syrup Take 5 mLs by mouth every 6 (six) hours as needed for cough. 120 mL 0  . prochlorperazine (COMPAZINE) 10 MG tablet Take 1 tablet (10 mg total) by mouth every 6 (six) hours as needed for nausea or vomiting. 30 tablet 0   No current facility-administered medications for this visit.    Review of Systems  Constitutional: positive for anorexia, fatigue, night sweats and weight loss Eyes: negative Ears, nose, mouth, throat, and face: negative Respiratory: positive for cough, dyspnea on exertion and sputum Cardiovascular: negative Gastrointestinal: positive for nausea and vomiting Genitourinary:negative Integument/breast: negative Hematologic/lymphatic: negative Musculoskeletal:negative Neurological: negative Behavioral/Psych: negative Endocrine: negative Allergic/Immunologic:  negative  Physical Exam  ERD:EYCXK, healthy, no distress, well developed, anxious and malnourished SKIN: skin color, texture, turgor are normal, no rashes or significant lesions HEAD: Normocephalic, No masses, lesions, tenderness or abnormalities EYES: normal, PERRLA, Conjunctiva are pink and non-injected EARS: External ears normal, Canals clear OROPHARYNX:no exudate, no erythema and lips, buccal mucosa, and tongue normal  NECK: Palpable low cervical and right supraclavicular lymphadenopathy LYMPH:  large tender anterior cervical nodes, Palpable low cervical and right supraclavicular lymphadenopathy LUNGS: expiratory wheezes bilaterally, scattered rales bilaterally HEART: regular rate & rhythm, no murmurs and no gallops ABDOMEN:abdomen soft, non-tender, normal bowel sounds and no masses or organomegaly BACK: Back symmetric, no curvature., No CVA tenderness, Range of motion is normal EXTREMITIES:no joint deformities, effusion, or inflammation, no edema, no skin discoloration  NEURO: alert & oriented x 3 with fluent speech, no focal motor/sensory deficits  PERFORMANCE STATUS: ECOG 1  LABORATORY DATA: Lab Results  Component Value Date   WBC 18.2* 03/15/2015   HGB 9.4* 03/15/2015   HCT 31.1* 03/15/2015   MCV 79.3 03/15/2015   PLT 523* 03/15/2015      Chemistry      Component Value Date/Time   NA 138 03/15/2015 0717   NA 138 12/18/2013 0848   K 4.0 03/15/2015 0717   K 4.0 12/18/2013 0848   CL 102 03/15/2015 0717   CL 105 12/18/2013 0848   CO2 23 03/15/2015 0717   CO2 28 12/18/2013 0848   BUN 5* 03/15/2015 0717   BUN 7 12/18/2013 0848  CREATININE 0.75 03/15/2015 0717   CREATININE 0.93 12/18/2013 0848      Component Value Date/Time   CALCIUM 9.2 03/15/2015 0717   CALCIUM 9.0 12/18/2013 0848   ALKPHOS 151* 03/15/2015 0717   AST 23 03/15/2015 0717   ALT 27 03/15/2015 0717   BILITOT 0.5 03/15/2015 0717       RADIOGRAPHIC STUDIES: Dg Chest 2 View  03/15/2015   CLINICAL DATA:  Supraclavicular and mediastinal adenopathy. EXAM: CHEST  2 VIEW COMPARISON:  Chest x-ray dated 01/18/2015 and CT scan dated 03/01/2015 FINDINGS: The cavitary infiltrate in the anterior aspect of the right upper lobe has slightly regressed. The mediastinal and right hilar adenopathy has also slightly regressed. Heart size and vascularity are normal. No effusions. Bones are normal. IMPRESSION: Interval slight regression of the cavitary right upper lobe infiltrate infiltrate and adenopathy. Electronically Signed   By: Lorriane Shire M.D.   On: 03/15/2015 07:16   Ct Chest Wo Contrast  03/01/2015  CLINICAL DATA:  Follow-up community-acquired pneumonia. EXAM: CT CHEST WITHOUT CONTRAST TECHNIQUE: Multidetector CT imaging of the chest was performed following the standard protocol without IV contrast. COMPARISON:  01/19/2015 FINDINGS: Mediastinum / Lymph Nodes: The large anteromedial right upper lobe and mediastinal mass is again noted. The dominant lesion which appears to invade anterior mediastinum measures 5.6 x 3.6 cm today compared to 5.2 x 4.2 cm previously adjacent thick-walled cavitary lesions in the medial right upper lobe persists. In the interval since the prior study, the patient has developed substantial mediastinal lymphadenopathy. 2.3 cm short axis prevascular lymph node is new in the interval. 3.4 cm short axis subcarinal lymph node was 1.2 cm previously. 1.9 cm right paratracheal lymph node was 1.8 cm previously. Lungs / Pleura: Cavitary lung lesions with satellite nodules in the anterior right upper lobe persists without substantial interval change. These are quite thick walled with the wall in some of these cavitary areas measuring up to 9-10 mm. Right lower lobe and left lung are clear. No pleural effusion. Trace pericardial effusion is new in the interval. Upper Abdomen:  Unremarkable. MSK / Soft Tissues: Bone windows reveal no worrisome lytic or sclerotic osseous lesions. IMPRESSION:  No substantial interval change in the anteromedial right upper lobe cavitary mass-like lesion which appears to involve the mediastinum anteriorly. However, has been interval development of bulky prevascular and subcarinal lymphadenopathy. The degree of lymph node progression in 5 weeks and lack of any discernible improvement/clearing is concerning. As such, neoplastic etiology, such as lymphoma, becomes end of higher concern. I discussed the results of this study with Dr. Elsworth Soho, who was covering for Dr. Vaughan Browner, on the afternoon of 02/28/2014. Electronically Signed   By: Misty Stanley M.D.   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 Further staging workup is still pending.   PLAN: I had a lengthy discussion with the patient and his mother about his current disease stage, prognosis and treatment options. I recommended for the patient to complete the staging workup by ordering a PET scan in addition to CT guided bone marrow biopsy and aspirate. I discussed with the patient his treatment options and recommended for him treatment with systemic chemotherapy with ABVD (Adriamycin, bleomycin, Velban and dacarbazine) on days 1 and 15 every 4  weeks. The total number of cycles and requirement of radiotherapy will be decided by the final staging workup. The patient had several studies performed recently including ESR that was elevated, elevated C-reactive protein, HIV test that was negative. I will also order hepatitis panel before starting his treatment. I recommended for the patient to have pulmonary function test as well as 2-D echo performed before his first treatment. I discussed with the patient adverse effect of this  treatment including but not limited to alopecia, myelosuppression, nausea and vomiting, liver, renal, cardiac as well as possibility of pulmonary fibrosis, secondary malignancy as well as infertility issues. I discussed with the patient consideration of his sperm preservation but he declined. He is expected to start the first cycle of his systemic chemotherapy on 04/06/2015. I will arrange for the patient to have a chemotherapy education class before starting the first dose of his treatment. I will start the patient on prophylactic allopurinol 100 mg by mouth twice a day during his treatment. I will also send prescription for Compazine 10 mg by mouth every 6 hours as needed for nausea to his pharmacy. For the persistent cough, I gave the patient prescription for Hycodan 5 ML by mouth every 6 hours as needed. I also referred the patient to the dietitian at the [facility] for evaluation and nutritional consultation. The patient would come back for follow-up visit with the start of the first cycle of his treatment. He was advised to call immediately if he has any concerning symptoms in the interval.  The patient voices understanding of current disease status and treatment options and is in agreement with the current care plan.  All questions were answered. The patient knows to call the clinic with any problems, questions or concerns. We can certainly see the patient much sooner if necessary.  Thank you so much for allowing me to participate in the care of MALIKE FOGLIO. I will continue to follow up the patient with you and assist in his care.  I spent 55 minutes counseling the patient face to face. The total time spent in the appointment was 80 minutes.  Disclaimer: This note was dictated with voice recognition software. Similar sounding words can inadvertently be transcribed and may not be corrected upon review.   Alilah Mcmeans K. March 26, 2015, 10:11 AM

## 2015-03-29 ENCOUNTER — Encounter (HOSPITAL_COMMUNITY): Payer: Self-pay

## 2015-03-29 ENCOUNTER — Other Ambulatory Visit: Payer: Self-pay | Admitting: Hematology

## 2015-03-29 ENCOUNTER — Encounter (HOSPITAL_COMMUNITY): Payer: Self-pay | Admitting: Emergency Medicine

## 2015-03-29 ENCOUNTER — Other Ambulatory Visit: Payer: Self-pay

## 2015-03-29 ENCOUNTER — Telehealth: Payer: Self-pay | Admitting: *Deleted

## 2015-03-29 ENCOUNTER — Ambulatory Visit (HOSPITAL_COMMUNITY): Payer: Self-pay

## 2015-03-29 ENCOUNTER — Ambulatory Visit (HOSPITAL_COMMUNITY)
Admission: RE | Admit: 2015-03-29 | Discharge: 2015-03-29 | Disposition: A | Discharge status: Home / Self Care | Payer: Self-pay | Source: Ambulatory Visit | Attending: Internal Medicine | Admitting: Internal Medicine

## 2015-03-29 ENCOUNTER — Inpatient Hospital Stay (HOSPITAL_COMMUNITY)
Admission: EM | Admit: 2015-03-29 | Discharge: 2015-03-30 | DRG: 315 | Disposition: A | Discharge status: Home / Self Care | Payer: Self-pay | Attending: Interventional Cardiology | Admitting: Interventional Cardiology

## 2015-03-29 DIAGNOSIS — D649 Anemia, unspecified: Secondary | ICD-10-CM | POA: Diagnosis present

## 2015-03-29 DIAGNOSIS — F909 Attention-deficit hyperactivity disorder, unspecified type: Secondary | ICD-10-CM | POA: Diagnosis present

## 2015-03-29 DIAGNOSIS — C8112 Nodular sclerosis classical Hodgkin lymphoma, intrathoracic lymph nodes: Secondary | ICD-10-CM

## 2015-03-29 DIAGNOSIS — I309 Acute pericarditis, unspecified: Principal | ICD-10-CM | POA: Diagnosis present

## 2015-03-29 DIAGNOSIS — I3139 Other pericardial effusion (noninflammatory): Secondary | ICD-10-CM | POA: Diagnosis present

## 2015-03-29 DIAGNOSIS — I313 Pericardial effusion (noninflammatory): Secondary | ICD-10-CM | POA: Diagnosis present

## 2015-03-29 DIAGNOSIS — J984 Other disorders of lung: Secondary | ICD-10-CM

## 2015-03-29 DIAGNOSIS — K219 Gastro-esophageal reflux disease without esophagitis: Secondary | ICD-10-CM | POA: Diagnosis present

## 2015-03-29 DIAGNOSIS — I314 Cardiac tamponade: Secondary | ICD-10-CM | POA: Diagnosis present

## 2015-03-29 LAB — CBC WITH DIFFERENTIAL/PLATELET
Basophils Absolute: 0 10*3/uL (ref 0.0–0.1)
Basophils Relative: 0 %
EOS ABS: 0.1 10*3/uL (ref 0.0–0.7)
EOS PCT: 1 %
HCT: 25.6 % — ABNORMAL LOW (ref 39.0–52.0)
HEMOGLOBIN: 8.2 g/dL — AB (ref 13.0–17.0)
LYMPHS ABS: 1.4 10*3/uL (ref 0.7–4.0)
LYMPHS PCT: 7 %
MCH: 24.6 pg — AB (ref 26.0–34.0)
MCHC: 32 g/dL (ref 30.0–36.0)
MCV: 76.9 fL — AB (ref 78.0–100.0)
MONOS PCT: 7 %
Monocytes Absolute: 1.5 10*3/uL — ABNORMAL HIGH (ref 0.1–1.0)
NEUTROS PCT: 85 %
Neutro Abs: 18.2 10*3/uL — ABNORMAL HIGH (ref 1.7–7.7)
Platelets: 472 10*3/uL — ABNORMAL HIGH (ref 150–400)
RBC: 3.33 MIL/uL — ABNORMAL LOW (ref 4.22–5.81)
RDW: 16 % — ABNORMAL HIGH (ref 11.5–15.5)
WBC: 21.3 10*3/uL — ABNORMAL HIGH (ref 4.0–10.5)

## 2015-03-29 LAB — COMPREHENSIVE METABOLIC PANEL
ALK PHOS: 174 U/L — AB (ref 38–126)
ALT: 25 U/L (ref 17–63)
ANION GAP: 10 (ref 5–15)
AST: 25 U/L (ref 15–41)
Albumin: 2.5 g/dL — ABNORMAL LOW (ref 3.5–5.0)
BUN: 5 mg/dL — ABNORMAL LOW (ref 6–20)
CALCIUM: 8.6 mg/dL — AB (ref 8.9–10.3)
CO2: 27 mmol/L (ref 22–32)
CREATININE: 0.66 mg/dL (ref 0.61–1.24)
Chloride: 99 mmol/L — ABNORMAL LOW (ref 101–111)
Glucose, Bld: 95 mg/dL (ref 65–99)
Potassium: 3.4 mmol/L — ABNORMAL LOW (ref 3.5–5.1)
SODIUM: 136 mmol/L (ref 135–145)
TOTAL PROTEIN: 7.5 g/dL (ref 6.5–8.1)
Total Bilirubin: 0.5 mg/dL (ref 0.3–1.2)

## 2015-03-29 LAB — T4, FREE: FREE T4: 1.09 ng/dL (ref 0.61–1.12)

## 2015-03-29 LAB — MAGNESIUM: MAGNESIUM: 2.1 mg/dL (ref 1.7–2.4)

## 2015-03-29 LAB — BRAIN NATRIURETIC PEPTIDE: B Natriuretic Peptide: 132.2 pg/mL — ABNORMAL HIGH (ref 0.0–100.0)

## 2015-03-29 LAB — TSH: TSH: 0.945 u[IU]/mL (ref 0.350–4.500)

## 2015-03-29 MED ORDER — DIPHENHYDRAMINE HCL 50 MG/ML IJ SOLN: 12.5000 mg | Freq: Once | INTRAMUSCULAR | Status: DC

## 2015-03-29 NOTE — ED Notes (Signed)
Patient is alert, oriented x4, and in no acute distress. MD at bedside assessing patient.

## 2015-03-29 NOTE — ED Notes (Signed)
Patient denies chest pain and is resting comfortably.

## 2015-03-29 NOTE — ED Notes (Signed)
Bed: [id number] Expected date:  Expected time:  Means of arrival:  Comments: [place]

## 2015-03-29 NOTE — ED Notes (Signed)
Lab stated they will run BNP with previously collected blood.

## 2015-03-29 NOTE — ED Provider Notes (Signed)
CSN: [id number]     Arrival date & time 03/29/15  1615 History   First MD Initiated Contact with Patient 03/29/15 1619     No chief complaint on file.    (Consider location/radiation/quality/duration/timing/severity/associated sxs/prior Treatment) HPI Comments: Patient is a 20 year old male who was recently diagnosed with lymphoma returning to the emergency room after an abnormal echo today showing cardiac tamponade due to a large pericardial effusion. Patient is asymptomatic from this. He denies any exertional dyspnea, chest pain or shortness of breath. He has had a cough for months with green sputum which is unchanged. He denies any fever, hospitalization or incarceration. He denies any risk factors of TB.  The history is provided by the patient.    Past Medical History  Diagnosis Date  . Adult ADHD   . Pneumonia   . Headache   . GERD (gastroesophageal reflux disease)   . 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 Video bronchoscopy Bilateral 01/22/2015    Procedure: VIDEO BRONCHOSCOPY WITH FLUORO;  Surgeon: Marshell Garfinkel, MD;  Location: [facility];  Service: Cardiopulmonary;  Laterality: Bilateral;  . Supraclavical node biopsy Right 03/15/2015    Procedure: RIGHT SUPRACLAVICAL/CERVICAL LYMPH NODE BIOPSY;  Surgeon: [name], MD;  Location: [place];  Service: Thoracic;  Laterality: Right;  . Scalene node biopsy Right 03/15/2015    Procedure: BIOPSY SCALENE NODE;  Surgeon: [name], MD;  Location: [place];  Service: Thoracic;  Laterality: Right;   No family history on file. Social History  Substance Use Topics  . Smoking status: Never Smoker   . Smokeless tobacco: Never Used  . Alcohol Use: Yes     Comment: rare    Review of Systems  All other systems reviewed and are negative.     Allergies  Amoxicillin-pot clavulanate and Bactrim  Home Medications   Prior  to Admission medications   Medication Sig Start Date End Date Taking? Authorizing Provider  allopurinol (ZYLOPRIM) 100 MG tablet Take 1 tablet (100 mg total) by mouth 2 (two) times daily. 03/26/15   Curt Bears, MD  gabapentin (NEURONTIN) 100 MG capsule Take 1 capsule (100 mg total) by mouth 3 (three) times daily. 09/21/14   Waldemar Dickens, MD  HYDROcodone-homatropine [facility]) 5-1.5 MG/5ML syrup Take 5 mLs by mouth every 6 (six) hours as needed for cough. 03/26/15   Curt Bears, MD  oxyCODONE (OXY IR/ROXICODONE) 5 MG immediate release tablet Take 1-2 tablets (5-10 mg total) by mouth every 6 (six) hours as needed (pain). 03/15/15   [name], MD  prochlorperazine (COMPAZINE) 10 MG tablet Take 1 tablet (10 mg total) by mouth every 6 (six) hours as needed for nausea or vomiting. 03/26/15   Curt Bears, MD   BP 119/77 mmHg  Pulse 103  Temp(Src) 98.4 F (36.9 C) (Oral)  Resp 18  Ht 5\' 10"  (1.778 m)  Wt 167 lb (75.751 kg)  BMI 23.96 kg/m2  SpO2 100% Physical Exam  Constitutional: He is oriented to person, place, and time. He appears well-developed and well-nourished. No distress.  HENT:  Head: Normocephalic and atraumatic.  Mouth/Throat: Oropharynx is clear and moist.  Eyes: Conjunctivae and EOM are normal. Pupils are equal, round, and reactive to light.  Neck: Normal range of motion. Neck supple.    Large tender lymph nodes present in the anterior and posterior cervical chains  Cardiovascular: Regular rhythm and intact distal pulses.  Tachycardia present.  No murmur heard. Pulmonary/Chest: Effort normal and breath sounds normal. No respiratory distress. He has no wheezes. He has no rales.  Abdominal: Soft. He exhibits no distension. There is no tenderness. There is no rebound and no guarding.  Musculoskeletal: Normal range of motion. He exhibits no edema or tenderness.  Lymphadenopathy:    He has cervical adenopathy.  Neurological: He is alert and oriented to person,  place, and time.  Skin: Skin is warm and dry. No rash noted. No erythema.  Psychiatric: He has a normal mood and affect. His behavior is normal.  Nursing note and vitals reviewed.   ED Course  Procedures (including critical care time) Labs Review Labs Reviewed  CBC WITH DIFFERENTIAL/PLATELET - Abnormal; Notable for the following:    WBC 21.3 (*)    RBC 3.33 (*)    Hemoglobin 8.2 (*)    HCT 25.6 (*)    MCV 76.9 (*)    MCH 24.6 (*)    RDW 16.0 (*)    Platelets 472 (*)    Neutro Abs 18.2 (*)    Monocytes Absolute 1.5 (*)    All other components within normal limits  COMPREHENSIVE METABOLIC PANEL - Abnormal; Notable for the following:    Potassium 3.4 (*)    Chloride 99 (*)    BUN 5 (*)    Calcium 8.6 (*)    Albumin 2.5 (*)    Alkaline Phosphatase 174 (*)    All other components within normal limits  BRAIN NATRIURETIC PEPTIDE - Abnormal; Notable for the following:    B Natriuretic Peptide 132.2 (*)    All other components within normal limits  TSH  MAGNESIUM  T4, FREE    Imaging Review No results found. I have personally reviewed and evaluated these images and lab results as part of my medical decision-making.   EKG Interpretation   Date/Time:  Monday March 29 2015 16:20:10 EDT Ventricular Rate:  101 PR Interval:  145 QRS Duration: 92 QT Interval:  320 QTC Calculation: 415 R Axis:   97 Text Interpretation:  Sinus tachycardia Borderline right axis deviation No  significant change since last tracing Confirmed by Maryan Rued  MD, Loree Fee  [phone]) on 03/29/2015 4:36:00 PM      MDM   Final diagnoses:  Pericardial effusion  Cardiac tamponade   Patient is a 20 year old male who was recently diagnosed with lymphoma who was getting his preliminary screening test when today he was found to have a large pericardial effusion. Patient is displaying no evidence of tamponade at this time. He is not short of breath, having chest pain or having other complaints other than a  cough. He denies any fever, suspect rash cardiac history. Most likely the effusion is coming for malignancy. He is not currently on chemotherapy but is getting ready to start. Currently he is only taking pain medication. Cardiology consult and will see the patient  8:26 PM Pt being [facility]ardi[facility]esis in the morning    Blanchie Dessert, MD 03/29/15 2026

## 2015-03-29 NOTE — Telephone Encounter (Signed)
Per staff message and POF I have scheduled appts. Advised scheduler of appts. JMW  

## 2015-03-29 NOTE — H&P (Addendum)
Jeffery Crane is an 20 y.o. male.    Primary Cardiologist:New- Dr. Tamala Julian Oncologist Dr. Julien Nordmann  [facility]  Chief Complaint: hx cough, sent to ER for tamponade on Echo  HPI: 20 year old male admitted in Jan with fevers, chills and productive cough and CT of chest with  cavitary right upper lobe lesion. There also was hilar and mediastinal adenopathy. He was treated for presumed lung abscess. However, there was concern that this could be a neoplastic process.  He saw Dr. Roxan Hockey for Rt cervical and supraclavicular lymph node biopsy.  He was then referred to Dr. 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  He was being worked up for staging which includes PT scan, CT guided bone marrow biopsy and aspirate and echo.  Then plan for chemo with adriamycin, bleomycin, Velban and dacarbazine. First cycle to begin 04/06/15.   Pt today with echo and large pericardial effusion and tamponade.  He has no SOB with sleeping on his stomach.  He continues with cough when up 40 degrees.  No chest pain.  Pt was to begin allopurinol 100 mg BID, compazine, and hycodan for cough.   Echo today: Study Conclusions  - Left ventricle: The cavity size was normal. Wall thickness was  normal. Systolic function was normal. The estimated ejection  fraction was in the range of 55% to 60%. Wall motion was normal;  there were no regional wall motion abnormalities. - Pericardium, extracardiac: A large pericardial effusion was  identified.  Impressions:  - Normal LV systolic function; large pericardial effusion with RA  and mild RV collapse; repiratory variation noted with MV inflow;  IVC dilated but collapses; findings concerning for tamponade  physiology; Dr Tamala Julian notified and will evaluate patient.  On CT of chest 03/01/15 there was trace pericardial effusion which was  new from CT of chest  01/19/15.   Currently pt resting comfortably in bed without pain or SOB.   EKG ST at 101 with Rt axis but no changes from 03/05/15, BP stable.  Past Medical History  Diagnosis Date  . Adult ADHD   . Pneumonia   . Headache   . GERD (gastroesophageal reflux disease)   . 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 Video bronchoscopy Bilateral 01/22/2015    Procedure: VIDEO BRONCHOSCOPY WITH FLUORO;  Surgeon: Marshell Garfinkel, MD;  Location: [facility];  Service: Cardiopulmonary;  Laterality: Bilateral;  . Supraclavical node biopsy Right 03/15/2015    Procedure: RIGHT SUPRACLAVICAL/CERVICAL LYMPH NODE BIOPSY;  Surgeon: [name], MD;  Location: [facility];  Service: Thoracic;  Laterality: Right;  . [place] node biopsy Right 03/15/2015    Procedure: BIOPSY SCALENE NODE;  Surgeon: [name], MD;  Location: [facility];  Service: Thoracic;  Laterality: Right;    Family History  Problem Relation Age of Onset  . Heart disease Maternal Grandfather    Social History:  reports that he has never smoked. He has never used smokeless tobacco. He reports that he drinks alcohol. He reports that he does not use illicit drugs.  Allergies: bactrim  Amoxicillin pot clavulanate   OUTPATIENT MEDICATIONS: No current facility-administered medications on file prior to encounter.   Current Outpatient Prescriptions on File Prior to Encounter  Medication Sig Dispense Refill  . HYDROcodone-homatropine (HYCODAN) 5-1.5 MG/5ML  syrup Take 5 mLs by mouth every 6 (six) hours as needed for cough. 120 mL 0  . oxyCODONE (OXY IR/ROXICODONE) 5 MG immediate release tablet Take 1-2 tablets (5-10 mg total) by mouth every 6 (six) hours as needed (pain). 30 tablet 0  . allopurinol (ZYLOPRIM) 100 MG tablet Take 1 tablet (100 mg total) by mouth 2 (two) times daily. (Patient not taking: Reported on 03/29/2015)  60 tablet 3  . gabapentin (NEURONTIN) 100 MG capsule Take 1 capsule (100 mg total) by mouth 3 (three) times daily. (Patient not taking: Reported on 03/29/2015) 90 capsule 0  . prochlorperazine (COMPAZINE) 10 MG tablet Take 1 tablet (10 mg total) by mouth every 6 (six) hours as needed for nausea or vomiting. (Patient not taking: Reported on 03/29/2015) 30 tablet 0     No results found for this or any previous visit (from the past 48 hour(s)). No results found.  ROS: General:no colds or fevers recently, no weight changes Skin:no rashes or ulcers HEENT:no blurred vision, no congestion CV:see HPI PUL:see HPI GI:no diarrhea constipation or melena, no indigestion GU:no hematuria, no dysuria MS:no joint pain, no claudication Neuro:no syncope, no lightheadedness- except when explaining the pericardial effusion due to anxiety  Endo:no diabetes, no thyroid disease   Blood pressure 109/71, pulse 102, temperature 98.4 F (36.9 C), temperature source Oral, resp. rate 17, height '5\' 10"'  (1.778 m), weight 167 lb (75.751 kg), SpO2 99 %. PE: General:Pleasant affect, NAD Skin:Warm and dry, brisk capillary refill HEENT:normocephalic, sclera clear, mucus membranes moist Neck:supple, no JVD, no bruits  Heart:S1S2 RRR without murmur, gallup, ? rub no click Lungs:clear without rales, rhonchi, or wheezes RFF:MBWG, non tender, + BS, do not palpate liver spleen or masses Ext:no lower ext edema, 2+ pedal pulses, 2+ radial pulses Neuro:alert and oriented X 3, MAE, follows commands, + facial symmetry    Assessment/Plan Principal Problem:   Acute pericardial effusion with appearance of tamponade.  No acute symptoms on CT of  Chest 03/01/15 had mild pericardial effusion.  None on CT form 01/18/15. ( CXR on the 13th of March with heart size and vascularity that were normal.  Dr. Tamala Julian to see for further direction.  Will recheck labs now.  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     [place] Nurse Practitioner Certified [place] Pager [phone] or after 5pm or weekends call [phone] 03/29/2015, 4:42 PM    The patient has been seen in conjunction with Cecilie Kicks, NP. All aspects of care have been considered and discussed. The patient has been personally interviewed, examined, and all clinical data has been reviewed.  The patient presents with large pericardial effusion and echocardiographic evidence of hemodynamic compromise. Clinical exam suggests the possibility of impending tamponade with increased heart rate, pulsus paradox of 10 -12 mmHg, and relatively low blood pressures.  Plan to admit to the hospital, he will be placed in a stepdown unit, and we will consider percutaneous paracardial drainage in a.m. unless he becomes unstable with reference to hemodynamics.  Etiology of the effusion is likely related to Hodgkin's lymphoma, either on an autoimmune, contiguous inflammatory basis, or infection. Hopefully the effusion is not malignant, but this also needs to be excluded. If malignant involvement, will likely need to have pericardiectomy as well as chemotherapy  for Hodgkin's.  Will need to coordinate approach with Oncologist, Curt Bears MD in AM. The patient was counseled to undergo percutaneous pericardial drainage, pericardiocentesis.. The procedural risks and benefits were discussed in detail. The risks discussed included death, infection, myocardial laceration, life-threatening bleeding, pneumothorax, liver laceration, among other problems. . The risk of these significant complications were estimated to occur less than 1% of the time. After discussion, the patient has agreed to proceed.

## 2015-03-29 NOTE — Progress Notes (Signed)
  Echocardiogram 2D Echocardiogram has been performed.  Darlina Sicilian M 03/29/2015, 3:50 PM

## 2015-03-29 NOTE — ED Notes (Signed)
Patient at [facility] getting Echo. ECHO resulted as cardiac tamponade. Sent to ED for further evaluation.

## 2015-03-29 NOTE — ED Notes (Signed)
MD at bedside. 

## 2015-03-30 ENCOUNTER — Encounter: Payer: Self-pay | Admitting: Internal Medicine

## 2015-03-30 ENCOUNTER — Encounter (HOSPITAL_COMMUNITY): Payer: Self-pay | Admitting: *Deleted

## 2015-03-30 ENCOUNTER — Encounter (HOSPITAL_COMMUNITY)
Admission: EM | Disposition: A | Discharge status: Home / Self Care | Payer: Self-pay | Source: Home / Self Care | Attending: Interventional Cardiology

## 2015-03-30 DIAGNOSIS — I314 Cardiac tamponade: Secondary | ICD-10-CM

## 2015-03-30 DIAGNOSIS — I3139 Other pericardial effusion (noninflammatory): Secondary | ICD-10-CM | POA: Diagnosis present

## 2015-03-30 DIAGNOSIS — I313 Pericardial effusion (noninflammatory): Secondary | ICD-10-CM | POA: Diagnosis present

## 2015-03-30 LAB — BASIC METABOLIC PANEL
Anion gap: 9 (ref 5–15)
CHLORIDE: 101 mmol/L (ref 101–111)
CO2: 27 mmol/L (ref 22–32)
Calcium: 8.3 mg/dL — ABNORMAL LOW (ref 8.9–10.3)
Creatinine, Ser: 0.64 mg/dL (ref 0.61–1.24)
GFR calc Af Amer: >60 mL/min (ref >60)
GFR calc non Af Amer: >60 mL/min (ref >60)
GLUCOSE: 116 mg/dL — AB (ref 65–99)
POTASSIUM: 3.7 mmol/L (ref 3.5–5.1)
Sodium: 137 mmol/L (ref 135–145)

## 2015-03-30 LAB — CBC
HEMATOCRIT: 25.6 % — AB (ref 39.0–52.0)
HEMOGLOBIN: 7.6 g/dL — AB (ref 13.0–17.0)
MCH: 23.2 pg — AB (ref 26.0–34.0)
MCHC: 29.7 g/dL — ABNORMAL LOW (ref 30.0–36.0)
MCV: 78 fL (ref 78.0–100.0)
Platelets: 408 10*3/uL — ABNORMAL HIGH (ref 150–400)
RBC: 3.28 MIL/uL — AB (ref 4.22–5.81)
RDW: 16 % — ABNORMAL HIGH (ref 11.5–15.5)
WBC: 16.8 10*3/uL — ABNORMAL HIGH (ref 4.0–10.5)

## 2015-03-30 LAB — PROTIME-INR
INR: 1.57 — ABNORMAL HIGH (ref 0.00–1.49)
PROTHROMBIN TIME: 18.9 s — AB (ref 11.6–15.2)

## 2015-03-30 SURGERY — PERICARDIOCENTESIS

## 2015-03-30 MED ORDER — SODIUM CHLORIDE 0.9% FLUSH: 3.0000 mL | Freq: Two times a day (BID) | INTRAVENOUS | Status: DC

## 2015-03-30 MED ORDER — OXYCODONE HCL 5 MG PO TABS: 5.0000 mg | ORAL_TABLET | Freq: Four times a day (QID) | ORAL | Status: DC | PRN

## 2015-03-30 MED ORDER — SODIUM CHLORIDE 0.9% FLUSH: 3.0000 mL | INTRAVENOUS | Status: DC | PRN

## 2015-03-30 MED ORDER — SODIUM CHLORIDE 0.9 % IV SOLN: 250.0000 mL | INTRAVENOUS | Status: DC | PRN

## 2015-03-30 MED ORDER — HYDROCODONE-HOMATROPINE 5-1.5 MG/5ML PO SYRP: 5.0000 mL | ORAL_SOLUTION | Freq: Four times a day (QID) | ORAL | Status: DC | PRN

## 2015-03-30 MED ORDER — ALLOPURINOL 100 MG PO TABS
100.0000 mg | ORAL_TABLET | Freq: Two times a day (BID) | ORAL | Status: DC
  Filled 2015-03-30: qty 1

## 2015-03-30 MED ORDER — PROCHLORPERAZINE MALEATE 10 MG PO TABS
10.0000 mg | ORAL_TABLET | Freq: Four times a day (QID) | ORAL | Status: DC | PRN
  Filled 2015-03-30: qty 1

## 2015-03-30 MED ORDER — SODIUM CHLORIDE 0.9% FLUSH
3.0000 mL | Freq: Two times a day (BID) | INTRAVENOUS | Status: DC
  Administered 2015-03-30: 3 mL via INTRAVENOUS

## 2015-03-30 MED ORDER — SODIUM CHLORIDE 0.9 % IV SOLN
INTRAVENOUS | Status: DC
Start: 2015-03-30 — End: 2015-03-30
  Administered 2015-03-30: 06:00:00 via INTRAVENOUS

## 2015-03-30 NOTE — Progress Notes (Signed)
Night RN reports pt did not want to sign consent form, pt stated to her that he had a lot of questions and did not want to sign the consent until he can talk to the doctor. Consent form placed on computer at bedside at this time.

## 2015-03-30 NOTE — Progress Notes (Signed)
   Spoke with Dr. Curt Bears, oncologist for Mr. Wolfrom. Findings were described.  My clinical assessment is that the patient is clinically stable despite the large pericardial effusion and has no CV symptoms. The echo suggests significant effusion with impending tamponade. My impression is that the effusion has gradually developed and therefore is being well tolerated.  Dr. Inda Merlin feels that when therapy is begun next week, the fusion should quickly improve.  Therefore, we will take a more conservative approach. We will allow the patient to be discharged today with specific instructions that if shortness of breath, dizziness, swelling, or other cardiac symptoms develop, he would require rehospitalization and pericardiocentesis.  The elevated white count, anemia, and low-grade temperature are related to Hodgkin's nodular sclerosing lymphoma.

## 2015-03-30 NOTE — Progress Notes (Signed)
Pt ambulated in hall independently and tolerated well, denies any SOB or CP. Ate breakfast and tolerated well.

## 2015-03-30 NOTE — Care Management Note (Signed)
Case Management Note  Patient Details  Name: Jeffery Crane MRN: [id number] Date of Birth: 1995/10/12  Subjective/Objective:     Adm w pericardial effusion  Action/Plan: lives w fam, [facility] is pcp   Expected Discharge Date:                  Expected Discharge Plan:  Home/Self Care  In-House Referral:     Discharge planning Services     Post Acute Care Choice:    Choice offered to:     DME Arranged:    DME Agency:     HH Arranged:    [place] Agency:     Status of Service:     Medicare Important Message Given:    Date Medicare IM Given:    Medicare IM give by:    Date Additional Medicare IM Given:    Additional Medicare Important Message give by:     If discussed at [place] of Stay Meetings, dates discussed:    Additional Comments:ur review done  Lacretia Leigh, RN 03/30/2015, 7:45 AM

## 2015-03-30 NOTE — Progress Notes (Signed)
D/c instructions reviewed with pt, copy of instructions given to pt. Pt waiting for his ride to arrive, will call nurse when ride is here.

## 2015-03-30 NOTE — Discharge Instructions (Signed)
Heart healthy diet   if shortness of breath, dizziness, swelling, or other cardiac symptoms develop, fast heart rate, chest pain. you would require rehospitalization and pericardiocentesis.  If this happens call Dr. Thompson Caul office [phone] and ask to speak to nurse that you are having problems.  If severe problems come to ER at [facility].  Continue your current medications.

## 2015-03-30 NOTE — Discharge Summary (Signed)
Physician Discharge Summary       Patient ID: GIBRIL MASTRO MRN: [id number] DOB/AGE: June 16, 1995 20 y.o.  Admit date: 03/29/2015 Discharge date: 03/30/2015 Primary Cardiologist: new Dr. 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 There also was hilar and mediastinal adenopathy. He was treated for presumed lung abscess. However, there was concern that this could be a neoplastic process. He saw Dr. Roxan Hockey for Rt cervical and supraclavicular lymph node biopsy. He was then referred to Dr. Julien Nordmann (03/26/15) for final pathology (Accession: [id number]) tat was consistent with classical Hodgkin lymphoma, nodular sclerosis subtype. He was being worked up for staging which includes PT scan, CT guided bone marrow biopsy and aspirate and echo. Then plan for chemo with adriamycin, bleomycin, Velban and dacarbazine. First cycle to begin 04/06/15.   Yesterday in further work up he had echocardiogram that revealed large pericardial effusion and findings concerning for tamponade physiology.  Pt was instructed to go to ER.  Pt was without pain or SOB, he sleeps lying on his stomach. EKG wat ST at 101 with Rt axis but no changes from 03/05/15.  BP stable.    On CT of chest 03/01/15 there was trace pericardial effusion which was new from CT of chest 01/19/15.     Pt with pulsus paradox of 10 -12 mmHg- pt transferred to [facility] with plans forpercutaneous pericardial drainage, pericardiocentesis 03/30/15 after discussing with Dr. Julien Nordmann.   By the next AM pt was stable with low grade  fever, Labs abnormal with elevated WBC and anemia due to  Hodgkin's nodular sclerosing lymphoma.   Dr. Julien Nordmann discussed the pericardial effusion with Dr. Tamala Julian- and belief is when chemo is started next week the fusion should quickly improve.  Pt seen and evaluaated by Dr. Tamala Julian and found stable for discharge with specific instructions that if shortness of breath, dizziness, swelling, or other cardiac symptoms develop, he would require rehospitalization and pericardiocentesis.  Pt ambulated without SOB or chest pain.  And discharged home.  Will follow up in office.     Consults: None  Significant Diagnostic Studies:  CBC Latest Ref Rng 03/30/2015 03/29/2015 03/26/2015  WBC 4.0 - 10.5 K/uL 16.8(H) 21.3(H) 20.4(H)  Hemoglobin 13.0 - 17.0 g/dL 7.6(L) 8.2(L) 8.3(L)  Hematocrit 39.0 - 52.0 % 25.6(L) 25.6(L) 26.4(L)  Platelets 150 - 400 K/uL 408(H) 472(H) 485(H)   CMP Latest Ref Rng 03/30/2015 03/29/2015 03/26/2015  Glucose 65 - 99 mg/dL 116(H) 95 104  BUN 6 - 20 mg/dL <5(L) 5(L) 5.5(L)  Creatinine 0.61 - 1.24 mg/dL 0.64 0.66 0.8  Sodium 135 - 145 mmol/L 137 136 137  Potassium 3.5 - 5.1 mmol/L 3.7 3.4(L) 3.7  Chloride 101 - 111 mmol/L 101 99(L) -  CO2 22 - 32 mmol/L '27 27 27  ' Calcium 8.9 - 10.3 mg/dL 8.3(L) 8.6(L) 9.1  Total Protein 6.5 - 8.1 g/dL - 7.5 7.9  Total Bilirubin 0.3 - 1.2 mg/dL - 0.5 0.37  Alkaline Phos 38 - 126 U/L - 174(H) 172(H)  AST 15 - 41 U/L - 25 20  ALT 17 - 63 U/L -  25 21   BNP 132 INR 1.57 TSH 0.945 Free T4 1.09   Echo prior to admit:  Study Conclusions  - Left ventricle: The cavity size was normal. Wall thickness was  normal. Systolic function was normal. The estimated ejection  fraction was in the range of 55% to 60%. Wall motion was normal;  there were no regional wall motion abnormalities. - Pericardium, extracardiac: A large pericardial effusion was  identified.  Impressions:  - Normal LV systolic function; large pericardial effusion with RA   and mild RV collapse; repiratory variation noted with MV inflow;  IVC dilated but collapses; findings concerning for tamponade  physiology; Dr Tamala Julian notified and will evaluate patient.   Discharge Exam: Blood pressure 101/49, pulse 98, temperature 99.9 F (37.7 C), temperature source Oral, resp. rate 18, height '5\' 10"'  (1.778 m), weight 168 lb 3.4 oz (76.3 kg), SpO2 97 %.   Disposition: 01-Home or Self Care     Medication List    STOP taking these medications        gabapentin 100 MG capsule  Commonly known as:  NEURONTIN      TAKE these medications        allopurinol 100 MG tablet  Commonly known as:  ZYLOPRIM  Take 1 tablet (100 mg total) by mouth 2 (two) times daily.     HYDROcodone-homatropine 5-1.5 MG/5ML syrup  Commonly known as:  HYCODAN  Take 5 mLs by mouth every 6 (six) hours as needed for cough.     oxyCODONE 5 MG immediate release tablet  Commonly known as:  Oxy IR/ROXICODONE  Take 1-2 tablets (5-10 mg total) by mouth every 6 (six) hours as needed (pain).     prochlorperazine 10 MG tablet  Commonly known as:  COMPAZINE  Take 1 tablet (10 mg total) by mouth every 6 (six) hours as needed for nausea or vomiting.       Follow-up Information    Follow up with Sinclair Grooms, MD On 04/09/2015.   Specialty:  Cardiology   Why:  at [facility] Dr. Thompson Caul PA [place] information:   [id number] [address] [phone]        Discharge Instructions: Heart healthy diet   if shortness of breath, dizziness, swelling, or other cardiac symptoms develop, fast heart rate, chest pain. you would require rehospitalization and pericardiocentesis.  If this happens call Dr. Thompson Caul office [phone] and ask to speak to nurse that you are having problems.  If severe problems come to ER at [facility].   Continue your current medications.     Signed: Isaiah Serge Nurse Practitioner-Certified [facility]:  [facility] 03/30/2015, 3:33 PM  Time spent on discharge : > 30 minutes.

## 2015-03-30 NOTE — Progress Notes (Signed)
Emend and aloxi-no insurance to see patient for application

## 2015-03-30 NOTE — Progress Notes (Signed)
Pt d/c'd via wheelchair with belongings with family/sig other, escorted by hospital volunteer.

## 2015-03-31 ENCOUNTER — Inpatient Hospital Stay (HOSPITAL_COMMUNITY)
Admission: AD | Admit: 2015-03-31 | Discharge: 2015-04-03 | DRG: 315 | Disposition: A | Discharge status: Home / Self Care | Payer: Self-pay | Source: Ambulatory Visit | Attending: Interventional Cardiology | Admitting: Interventional Cardiology

## 2015-03-31 ENCOUNTER — Encounter (HOSPITAL_COMMUNITY): Payer: Self-pay | Admitting: *Deleted

## 2015-03-31 ENCOUNTER — Telehealth: Payer: Self-pay | Admitting: *Deleted

## 2015-03-31 ENCOUNTER — Encounter: Payer: Self-pay | Admitting: *Deleted

## 2015-03-31 ENCOUNTER — Telehealth: Payer: Self-pay | Admitting: Interventional Cardiology

## 2015-03-31 ENCOUNTER — Encounter: Payer: Self-pay | Admitting: Internal Medicine

## 2015-03-31 ENCOUNTER — Encounter (HOSPITAL_COMMUNITY)
Admission: RE | Admit: 2015-03-31 | Discharge: 2015-03-31 | Disposition: A | Discharge status: Home / Self Care | Payer: Self-pay | Source: Ambulatory Visit | Attending: Internal Medicine | Admitting: Internal Medicine

## 2015-03-31 DIAGNOSIS — C8112 Nodular sclerosis classical Hodgkin lymphoma, intrathoracic lymph nodes: Secondary | ICD-10-CM | POA: Insufficient documentation

## 2015-03-31 DIAGNOSIS — I314 Cardiac tamponade: Principal | ICD-10-CM | POA: Diagnosis present

## 2015-03-31 DIAGNOSIS — K219 Gastro-esophageal reflux disease without esophagitis: Secondary | ICD-10-CM | POA: Diagnosis present

## 2015-03-31 DIAGNOSIS — I3139 Other pericardial effusion (noninflammatory): Secondary | ICD-10-CM | POA: Diagnosis present

## 2015-03-31 DIAGNOSIS — I319 Disease of pericardium, unspecified: Secondary | ICD-10-CM

## 2015-03-31 DIAGNOSIS — D649 Anemia, unspecified: Secondary | ICD-10-CM | POA: Diagnosis present

## 2015-03-31 DIAGNOSIS — Z881 Allergy status to other antibiotic agents status: Secondary | ICD-10-CM

## 2015-03-31 DIAGNOSIS — F909 Attention-deficit hyperactivity disorder, unspecified type: Secondary | ICD-10-CM | POA: Diagnosis present

## 2015-03-31 DIAGNOSIS — Z880 Allergy status to penicillin: Secondary | ICD-10-CM

## 2015-03-31 DIAGNOSIS — I313 Pericardial effusion (noninflammatory): Secondary | ICD-10-CM | POA: Diagnosis present

## 2015-03-31 LAB — TYPE AND SCREEN
ABO/RH(D): AB POS
Antibody Screen: NEGATIVE

## 2015-03-31 LAB — HEMOGLOBIN AND HEMATOCRIT, BLOOD
HCT: 27 % — ABNORMAL LOW (ref 39.0–52.0)
HEMOGLOBIN: 8 g/dL — AB (ref 13.0–17.0)

## 2015-03-31 LAB — GLUCOSE, CAPILLARY: Glucose-Capillary: 99 mg/dL (ref 65–99)

## 2015-03-31 LAB — ABO/RH: ABO/RH(D): AB POS

## 2015-03-31 MED ORDER — ONDANSETRON HCL 4 MG/2ML IJ SOLN: 4.0000 mg | Freq: Four times a day (QID) | INTRAMUSCULAR | Status: DC | PRN

## 2015-03-31 MED ORDER — ACETAMINOPHEN 650 MG RE SUPP
650.0000 mg | Freq: Four times a day (QID) | RECTAL | Status: DC | PRN
Start: 2015-03-31 — End: 2015-04-03

## 2015-03-31 MED ORDER — FLUDEOXYGLUCOSE F - 18 (FDG) INJECTION
8.3000 | Freq: Once | INTRAVENOUS | Status: AC | PRN
  Administered 2015-03-31: 8.3 via INTRAVENOUS

## 2015-03-31 MED ORDER — ACETAMINOPHEN 325 MG PO TABS
650.0000 mg | ORAL_TABLET | Freq: Four times a day (QID) | ORAL | Status: DC | PRN
  Administered 2015-03-31 – 2015-04-01 (×2): 650 mg via ORAL
  Filled 2015-03-31 (×2): qty 2

## 2015-03-31 MED ORDER — DIPHENHYDRAMINE HCL 25 MG PO CAPS
25.0000 mg | ORAL_CAPSULE | Freq: Four times a day (QID) | ORAL | Status: DC | PRN
  Administered 2015-03-31 – 2015-04-02 (×3): 25 mg via ORAL
  Filled 2015-03-31 (×3): qty 1

## 2015-03-31 MED ORDER — HYDROCODONE-ACETAMINOPHEN 5-325 MG PO TABS
1.0000 | ORAL_TABLET | ORAL | Status: DC | PRN
  Administered 2015-04-01 – 2015-04-02 (×3): 2 via ORAL
  Filled 2015-03-31 (×2): qty 2

## 2015-03-31 MED ORDER — SODIUM CHLORIDE 0.9 % IV SOLN: 250.0000 mL | INTRAVENOUS | Status: DC | PRN

## 2015-03-31 MED ORDER — SODIUM CHLORIDE 0.9% FLUSH: 3.0000 mL | INTRAVENOUS | Status: DC | PRN

## 2015-03-31 MED ORDER — ZOLPIDEM TARTRATE 5 MG PO TABS: 5.0000 mg | ORAL_TABLET | Freq: Every evening | ORAL | Status: DC | PRN

## 2015-03-31 MED ORDER — SODIUM CHLORIDE 0.9% FLUSH
3.0000 mL | Freq: Two times a day (BID) | INTRAVENOUS | Status: DC
  Administered 2015-03-31 – 2015-04-02 (×5): 3 mL via INTRAVENOUS

## 2015-03-31 MED ORDER — SODIUM CHLORIDE 0.9 % IV SOLN
INTRAVENOUS | Status: DC
  Administered 2015-04-01 (×2): via INTRAVENOUS

## 2015-03-31 MED ORDER — ONDANSETRON HCL 4 MG PO TABS: 4.0000 mg | ORAL_TABLET | Freq: Four times a day (QID) | ORAL | Status: DC | PRN

## 2015-03-31 MED ORDER — SENNOSIDES-DOCUSATE SODIUM 8.6-50 MG PO TABS: 1.0000 | ORAL_TABLET | Freq: Every evening | ORAL | Status: DC | PRN

## 2015-03-31 NOTE — Progress Notes (Signed)
Spoke w/ pt regarding ins.  He stated that his mom told him he has Medicaid, they're just waiting for the card.  I informed him if Medicaid doesn't go thru he can apply for a discount thru the hospital so I will give him my contact info on 04/02/15 in case he would like to apply for assistance thru the hospital.

## 2015-03-31 NOTE — Progress Notes (Signed)
Oncology Nurse Navigator Documentation  Oncology Nurse Navigator Flowsheets 03/31/2015  Navigator Encounter Type Other  Treatment Phase Pre-Tx/Tx Discussion  Barriers/Navigation Needs Coordination of Care  Acuity Level 2  Time Spent with Patient 13   Per Dr. Julien Nordmann I called Dr. Leonarda Salon office to update them on PET scan results.  Patient has large pericardial effusion.  Dr. Roxan Hockey was notified and ask that I call Cardiology.  I did and spoke with scheduler.  She stated she would update the nurse.  She will have the nurse call me with an update.

## 2015-03-31 NOTE — Telephone Encounter (Signed)
New message   Dr.Muhummad office is calling for pt to have a cath STAT  Pt had a 2D echo PET scan today  And the result the radiologist called Dr. Rogue Jury  Dr. Leonides Schanz advised to have Dr. Rocky Morel STAT  r

## 2015-03-31 NOTE — Progress Notes (Signed)
Jeffery Crane called and left me a vm message to call.  I called him back.  He asked me what was going on with the patient.  He stated he was aware of situation and already spoke with Jeffery Crane.  I listened as explained and raised his voice.  He stated I guess I need to speak with Jeffery Crane.  He gave me his cell phone and I gave it to Jeffery Crane.  Jeffery Crane called Jeffery Crane.    Jeffery Crane asked that I call Jeffery Crane back so he could speak with him.  I called and Jeffery Crane spoke with patient.

## 2015-03-31 NOTE — Telephone Encounter (Signed)
I received a call back from Dr. Thompson Caul office.  They stated patient needs to go to ED.  I updated Dr. Julien Nordmann agreed patient needed to go to hospital.  I called patient and asked how he was feeling.  He said ok just tired.  I asked that he go to [facility] ED asap.  I asked that he call for a ride to hospital.  He stated he would go.

## 2015-03-31 NOTE — Telephone Encounter (Signed)
Per Dr. Worthy Flank request, I called patient.  I gave him my office number to call and stated Dr. Julien Nordmann would like to speak with you.

## 2015-03-31 NOTE — Telephone Encounter (Signed)
Spoke with Dr.Smith. He will call Dr.Mohamed's nurse Hinton Dyer to see what is needed

## 2015-03-31 NOTE — H&P (Addendum)
HISTORY AND PHYSICAL  Patient ID: DEVRON COHICK MRN: [id number], DOB/AGE: [birth year] 20 y.o. Date of Encounter: 03/31/2015  Primary Physician: [facility] Primary Cardiologist: Dr Tamala Julian Oncologist: Dr Julien Nordmann  Chief Complaint:  Pericardial Effusion with tamponade  HPI: MAJESTY OEHLERT is a 20 y.o. male with a history of admission in Jan with fevers, chills and productive cough. CT of chest withcavitary right upper lobe lesion. There also was hilar and mediastinal adenopathy. Rt cervical and supraclavicular lymph node biopsy was consistent with classical Hodgkin lymphoma, nodular sclerosis subtype. Dr Julien Nordmann planning PET scan, CT guided bone marrow biopsy and aspirate and echo.   Echo 03/27 showed large pericardial effusion, concern for tamponade, but pt was asymptomatic and it was felt this could be followed closely as an outpatient. He was to start chemo and hopefully the effusion would decrease as the lymphoma was treated.   The PET scan was performed 03/28,results are below. It showed multiple areas of enhancement, plus an increase in the size of the effusion compared to the CT performed 03/01/2015. Mr Moffatt was contacted and requested to come in for admission.   Mr Barringer feels generally bad and is struggling with the bad news. He is not SOB, has had no presyncope and no chest pain. He is able to ambulate without difficulty, but has not felt like doing much.    Past Medical History  Diagnosis Date  . Adult ADHD   . Pneumonia   . Headache   . GERD (gastroesophageal reflux disease)    Pericardial Effusion 03/26/2015  . 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 Video bronchoscopy Bilateral 01/22/2015    Procedure: VIDEO BRONCHOSCOPY WITH FLUORO; Surgeon: Marshell Garfinkel, MD; Location: [facility]; Service:  Cardiopulmonary; Laterality: Bilateral;  . Supraclavical node biopsy Right 03/15/2015    Procedure: RIGHT SUPRACLAVICAL/CERVICAL LYMPH NODE BIOPSY; Surgeon: [name], MD; Location: [facility]; Service: Thoracic; Laterality: Right;  . [place]ene node biopsy Right 03/15/2015    Procedure: BIOPSY SCALENE NODE; Surgeon: [name], MD; Location: [facility]; Service: Thoracic; Laterality: Right;     I have reviewed the patient's current medications. Prior to Admission medications   Medication Sig Start Date End Date Taking? Authorizing Provider  allopurinol (ZYLOPRIM) 100 MG tablet Take 1 tablet (100 mg total) by mouth 2 (two) times daily. Patient not taking: Reported on 03/29/2015 03/26/15   Curt Bears, MD  HYDROcodone-homatropine [facility]) 5-1.5 MG/5ML syrup Take 5 mLs by mouth every 6 (six) hours as needed for cough. 03/26/15   Curt Bears, MD  oxyCODONE (OXY IR/ROXICODONE) 5 MG immediate release tablet Take 1-2 tablets (5-10 mg total) by mouth every 6 (six) hours as needed (pain). 03/15/15   [name], MD  prochlorperazine (COMPAZINE) 10 MG tablet Take 1 tablet (10 mg total) by mouth every 6 (six) hours as needed for nausea or vomiting. Patient not taking: Reported on 03/29/2015 03/26/15   Curt Bears, MD   Scheduled Meds: Continuous Infusions: PRN Meds:.  Allergies:  Allergies  Allergen Reactions  . Amoxicillin-Pot Clavulanate Hives and Rash       . Bactrim Other (See Comments)    Unknown childhood reaction per pt's mom    Social History   Social History  . Marital Status: Single    Spouse Name: N/A  . Number of Children: N/A  . Years of Education: N/A  Occupational History  . Not on file.   Social History Main Topics  . Smoking status: Never Smoker   . Smokeless tobacco: Never Used  . Alcohol Use: Yes     Comment: occasionally  . Drug  Use: No  . Sexual Activity: Not on file   Other Topics Concern  . Not on file   Social History Narrative- pt father is with him today    Family History  Problem Relation Age of Onset  . Heart disease Maternal Grandfather    Family Status  Relation Status Death Age  . Mother Alive   . Father Alive   . Maternal Grandfather Alive     Review of Systems: Full 14-point review of systems otherwise negative except as noted above.  Physical Exam: BP 112/57 mmHg  Temp(Src) 102.9 F (39.4 C) (Oral)  Ht _0  (1.778 m)  Wt 171 lb 8.3 oz (77.8 kg)  BMI 24.61 kg/m2  SpO2 99% General: Well developed, well nourished,male in no acute distress. Head: Normocephalic, atraumatic, sclera non-icteric, no xanthomas, nares are without discharge. Dentition:  Neck: No carotid bruits. JVD not elevated. No thyromegally Lungs: Good expansion bilaterally. without wheezes or rhonchi.  Heart: Regular rate and rhythm with S1 S2. No S3 or S4. No murmur, no rubs, or gallops appreciated. Abdomen: Soft, non-tender, non-distended with normoactive bowel sounds. No hepatomegaly. No rebound/guarding. No obvious abdominal masses. Msk: Strength and tone appear normal for age. No joint deformities or effusions, no spine or costo-vertebral angle tenderness. Extremities: No clubbing or cyanosis. No edema. Distal pedal pulses are 2+ in 4 extrem Neuro: Alert and oriented X 3. Moves all extremities spontaneously. No focal deficits noted. Psych: Responds to questions appropriately with a normal affect. Skin: No rashes or lesions noted  Labs:   Recent Labs    Lab Results  Component Value Date   WBC 16.8* 03/30/2015   HGB 7.6* 03/30/2015   HCT 25.6* 03/30/2015   MCV 78.0 03/30/2015   PLT 408* 03/30/2015      Recent Labs  03/30/15 0301  INR 1.57*    Recent Labs Lab 03/29/15 1628 03/30/15 0301  NA 136 137  K 3.4* 3.7    CL 99* 101  CO2 27 27  BUN 5* <5*  CREATININE 0.66 0.64  CALCIUM 8.6* 8.3*  PROT 7.5 --   BILITOT 0.5 --   ALKPHOS 174* --   ALT 25 --   AST 25 --   GLUCOSE 95 116*     Last Labs    B NATRIURETIC PEPTIDE  Date/Time Value Ref Range Status  03/29/2015 05:32 PM 132.2* 0.0 - 100.0 pg/mL Final      Radiology/Studies: Nm Pet Image Initial (pi) Skull Base To Thigh 03/31/2015 CLINICAL DATA: Initial treatment strategy for Hodgkin's lymphoma. EXAM: NUCLEAR MEDICINE PET SKULL BASE TO THIGH TECHNIQUE: 8.3 mCi F-18 FDG was injected intravenously. Full-ring PET imaging was performed from the skull base to thigh after the radiotracer. CT data was obtained and used for attenuation correction and anatomic localization. FASTING BLOOD GLUCOSE: Value: 99 mg/dl COMPARISON: CT 07/29/2015, 01/19/2015 FINDINGS: NECK Enlarged bulky hypermetabolic bladder supraclavicular nodes with SUV max equal 17. CHEST Enlarged RIGHT axillary lymph nodes. Individual lymph nodes measure 2 cm short axis with SUV max 18.1. The RIGHT upper lobe cavitary mass is again demonstrated measuring approximately 9.6 x 4.5 cm. The mass is intensely hypermetabolic with SUV max 53.9. Hyper meta paratracheal, subcarinal and bilateral hilar lymph nodes which are equally intense. There is a  new large pericardial effusion. The effusion measures 39 mm in depth adjacent to the LEFT ventricle which is increased from 1-2 mm on comparison exam CT 03/01/2015. Effusion anterior to the RIGHT ventricle measures 11 mm slightly increased from 9 mm on prior. ABDOMEN/PELVIS Although spleen is not enlarged, it is relative increased in hypermetabolic activity compared to the liver with SUV max equal 4.2 compared to 2.8. No hypermetabolic lymph nodes in the abdomen or pelvis. SKELETON There is diffuse intensely hypermetabolic bone marrow activity throughout the and higher axillary and appendicular skeleton. For  example in the posterior LEFT iliac bone SUV max equals 7.8. IMPRESSION: 1. Hypermetabolic supraclavicular, RIGHT axillary and mediastinal adenopathy consistent lymphoma. 2. Intensely hypermetabolic RIGHT upper lobe cavitary mass consistent with lymphoma. 3. Hypermetabolic but normal size spleen is concerning for lymphoma involvement. 4. Intense metabolic activity throughout the entire marrow space is consistent with lymphoma involvement of the skeleton. 5. Large pericardial effusion increased from CT 2 days prior. The pericardial effusion was discussed with Dr. Julien Nordmann [facility] with recommendation for cardiology / cardiothoracic surgery consultation. 03/31/2015 at09:46. Electronically Signed By: Suzy Bouchard M.D. On: 03/31/2015 09:50   Echo: 03/29/2015 - Left ventricle: The cavity size was normal. Wall thickness was  normal. Systolic function was normal. The estimated ejection  fraction was in the range of 55% to 60%. Wall motion was normal;  there were no regional wall motion abnormalities. - Pericardium, extracardiac: A large pericardial effusion was  identified. Impressions: - Normal LV systolic function; large pericardial effusion with RA  and mild RV collapse; repiratory variation noted with MV inflow;  IVC dilated but collapses; findings concerning for tamponade  physiology; Dr Tamala Julian notified and will evaluate patient.  ECG: 03/30/2015 SR, no acute changes  ASSESSMENT AND PLAN:  Principal Problem:   Pericardial effusion with cardiac tamponade - for pericardiocentesis in am - cell counts and cultures will be obtained. 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 All aspects of care have been considered and discussed. The patient has been personally interviewed, examined, and all clinical data has been reviewed.  The patient re-presents with large pericardial effusion. Was admitted and discharged on 3/28-29/17 after echocardiogram identified a large pericardial effusion as part of pre-chemo evaluation. Initially, after discussion with oncology, a conservative approach was felt to be reasonable as chemotherapy would likely lead to improvement. He had PET scan today and concern was raised by oncology, and request for pericardiocentesis was placed.  Other than cough, the patient is asymptomatic.  Etiology of the effusion is likely related to Hodgkin's lymphoma, either on an autoimmune, contiguous inflammatory basis, or infection. Hopefully the effusion is not malignant, but this also needs to be excluded. If malignant involvement, will likely need to have pericardiectomy as well as chemotherapy for Hodgkin's.  Plan pericardiocentesis and drain in AM with full w/u of fluid for lymphoma, infection, etc.  The patient was counseled to undergo percutaneous pericardial drainage, pericardiocentesis.. The procedural risks and benefits were discussed in detail. The risks discussed included death, infection, myocardial laceration, life-threatening bleeding, pneumothorax, liver laceration, among other problems. . The  risk of these significant complications were estimated to occur less than 1% of the time. After discussion, the patient has agreed to proceed.  Family including mother, father, and aunt were present. All questions answered.  Type and cross.

## 2015-04-01 ENCOUNTER — Other Ambulatory Visit: Payer: Self-pay | Admitting: Radiology

## 2015-04-01 ENCOUNTER — Encounter (HOSPITAL_COMMUNITY): Payer: Self-pay | Admitting: Cardiovascular Disease

## 2015-04-01 ENCOUNTER — Other Ambulatory Visit (HOSPITAL_COMMUNITY): Payer: Self-pay

## 2015-04-01 ENCOUNTER — Ambulatory Visit (HOSPITAL_COMMUNITY): Admit: 2015-04-01 | Payer: Self-pay | Admitting: Cardiovascular Disease

## 2015-04-01 ENCOUNTER — Encounter (HOSPITAL_COMMUNITY)
Admission: AD | Disposition: A | Discharge status: Home / Self Care | Payer: Self-pay | Source: Ambulatory Visit | Attending: Interventional Cardiology

## 2015-04-01 HISTORY — PX: CARDIAC CATHETERIZATION: SHX172

## 2015-04-01 LAB — CBC
HCT: 29.9 % — ABNORMAL LOW (ref 39.0–52.0)
HEMOGLOBIN: 9 g/dL — AB (ref 13.0–17.0)
MCH: 23.6 pg — AB (ref 26.0–34.0)
MCHC: 30.1 g/dL (ref 30.0–36.0)
MCV: 78.5 fL (ref 78.0–100.0)
PLATELETS: 457 10*3/uL — AB (ref 150–400)
RBC: 3.81 MIL/uL — ABNORMAL LOW (ref 4.22–5.81)
RDW: 16 % — AB (ref 11.5–15.5)
WBC: 22.4 10*3/uL — ABNORMAL HIGH (ref 4.0–10.5)

## 2015-04-01 LAB — COMPREHENSIVE METABOLIC PANEL
ALBUMIN: 2 g/dL — AB (ref 3.5–5.0)
ALK PHOS: 186 U/L — AB (ref 38–126)
ALT: 26 U/L (ref 17–63)
AST: 29 U/L (ref 15–41)
Anion gap: 9 (ref 5–15)
BILIRUBIN TOTAL: 0.3 mg/dL (ref 0.3–1.2)
CALCIUM: 8.4 mg/dL — AB (ref 8.9–10.3)
CO2: 26 mmol/L (ref 22–32)
CREATININE: 0.59 mg/dL — AB (ref 0.61–1.24)
Chloride: 103 mmol/L (ref 101–111)
GFR calc Af Amer: >60 mL/min (ref >60)
GFR calc non Af Amer: >60 mL/min (ref >60)
Glucose, Bld: 124 mg/dL — ABNORMAL HIGH (ref 65–99)
Potassium: 3.6 mmol/L (ref 3.5–5.1)
SODIUM: 138 mmol/L (ref 135–145)
TOTAL PROTEIN: 7 g/dL (ref 6.5–8.1)

## 2015-04-01 LAB — BODY FLUID CELL COUNT WITH DIFFERENTIAL
EOS FL: 0 %
Lymphs, Fluid: 98 %
Monocyte-Macrophage-Serous Fluid: 1 % — ABNORMAL LOW (ref 50–90)
Neutrophil Count, Fluid: 1 % (ref 0–25)
WBC FLUID: 37000 uL — AB (ref 0–1000)

## 2015-04-01 LAB — GRAM STAIN

## 2015-04-01 SURGERY — PERICARDIOCENTESIS

## 2015-04-01 MED ORDER — HYDROCODONE-ACETAMINOPHEN 5-325 MG PO TABS
ORAL_TABLET | ORAL | Status: AC
  Filled 2015-04-01: qty 2

## 2015-04-01 MED ORDER — MIDAZOLAM HCL 2 MG/2ML IJ SOLN
INTRAMUSCULAR | Status: AC
Start: 2015-04-01 — End: 2015-04-01
  Filled 2015-04-01: qty 2

## 2015-04-01 MED ORDER — IBUPROFEN 200 MG PO TABS
600.0000 mg | ORAL_TABLET | Freq: Three times a day (TID) | ORAL | Status: DC
  Administered 2015-04-01 – 2015-04-03 (×7): 600 mg via ORAL
  Filled 2015-04-01 (×7): qty 3

## 2015-04-01 MED ORDER — MIDAZOLAM HCL 2 MG/2ML IJ SOLN
INTRAMUSCULAR | Status: AC
  Filled 2015-04-01: qty 2

## 2015-04-01 MED ORDER — FENTANYL CITRATE (PF) 100 MCG/2ML IJ SOLN
INTRAMUSCULAR | Status: DC | PRN
  Administered 2015-04-01: 50 ug via INTRAVENOUS
  Administered 2015-04-01: 25 ug via INTRAVENOUS
  Administered 2015-04-01 (×2): 50 ug via INTRAVENOUS

## 2015-04-01 MED ORDER — MIDAZOLAM HCL 2 MG/2ML IJ SOLN
INTRAMUSCULAR | Status: DC | PRN
  Administered 2015-04-01: 2 mg via INTRAVENOUS
  Administered 2015-04-01: 1 mg via INTRAVENOUS
  Administered 2015-04-01: 2 mg via INTRAVENOUS
  Administered 2015-04-01: 1 mg via INTRAVENOUS

## 2015-04-01 MED ORDER — LIDOCAINE HCL (PF) 1 % IJ SOLN
INTRAMUSCULAR | Status: AC
  Filled 2015-04-01: qty 30

## 2015-04-01 MED ORDER — ALLOPURINOL 100 MG PO TABS
100.0000 mg | ORAL_TABLET | Freq: Two times a day (BID) | ORAL | Status: DC
  Administered 2015-04-01 – 2015-04-03 (×5): 100 mg via ORAL
  Filled 2015-04-01 (×5): qty 1

## 2015-04-01 MED ORDER — HEPARIN (PORCINE) IN NACL 2-0.9 UNIT/ML-% IJ SOLN
INTRAMUSCULAR | Status: DC | PRN
  Administered 2015-04-01: 500 mL

## 2015-04-01 MED ORDER — FENTANYL CITRATE (PF) 100 MCG/2ML IJ SOLN
INTRAMUSCULAR | Status: AC
  Filled 2015-04-01: qty 2

## 2015-04-01 MED ORDER — MORPHINE SULFATE (PF) 2 MG/ML IV SOLN
2.0000 mg | INTRAVENOUS | Status: DC | PRN
  Administered 2015-04-01 – 2015-04-03 (×2): 2 mg via INTRAVENOUS
  Filled 2015-04-01: qty 1

## 2015-04-01 MED ORDER — LIDOCAINE HCL (PF) 1 % IJ SOLN
INTRAMUSCULAR | Status: DC | PRN
  Administered 2015-04-01: 13 mL
  Administered 2015-04-01: 10 mL
  Administered 2015-04-01: 7 mL

## 2015-04-01 MED ORDER — OXYCODONE HCL 5 MG PO TABS: 5.0000 mg | ORAL_TABLET | Freq: Four times a day (QID) | ORAL | Status: DC | PRN

## 2015-04-01 MED ORDER — PROCHLORPERAZINE MALEATE 10 MG PO TABS
10.0000 mg | ORAL_TABLET | Freq: Four times a day (QID) | ORAL | Status: DC | PRN
  Filled 2015-04-01: qty 1

## 2015-04-01 MED ORDER — HEPARIN (PORCINE) IN NACL 2-0.9 UNIT/ML-% IJ SOLN
INTRAMUSCULAR | Status: AC
  Filled 2015-04-01: qty 500

## 2015-04-01 MED ORDER — HYDROCODONE-HOMATROPINE 5-1.5 MG/5ML PO SYRP: 5.0000 mL | ORAL_SOLUTION | Freq: Four times a day (QID) | ORAL | Status: DC | PRN

## 2015-04-01 MED ORDER — MORPHINE SULFATE (PF) 2 MG/ML IV SOLN
INTRAVENOUS | Status: AC
  Filled 2015-04-01: qty 1

## 2015-04-01 SURGICAL SUPPLY — 7 items
BAG SNAP BAND KOVER 36X36 (MISCELLANEOUS) ×3 IMPLANT
COVER DOME SNAP 22 D (MISCELLANEOUS) ×6 IMPLANT
COVER PRB 48X5XTLSCP FOLD TPE (BAG) ×1 IMPLANT
COVER PROBE 5X48 (BAG) ×2
EVACUATOR 1/8 PVC DRAIN (DRAIN) ×3 IMPLANT
PERIVAC PERICARDIOCENTESIS 8.3 (TRAY / TRAY PROCEDURE) ×3 IMPLANT
TUBING ART PRESS 72  MALE/MALE (TUBING) ×3 IMPLANT

## 2015-04-01 NOTE — Progress Notes (Signed)
       Patient Name: Jeffery Crane Date of Encounter: 04/01/2015    SUBJECTIVE: Having significant pain post pericardiocentesis. No dyspnea. Restless in bed.  TELEMETRY:  ST at [place]:   04/01/15 0905 04/01/15 0910 04/01/15 0915 04/01/15 0930  BP: 130/74 130/72 135/68 117/82  Pulse:    135  Temp:      TempSrc:      Resp: 24 15 25 23   Height:      Weight:      SpO2: 95% 97%  92%    Intake/Output Summary (Last 24 hours) at 04/01/15 0934 Last data filed at 04/01/15 0842  Gross per 24 hour  Intake      0 ml  Output    875 ml  Net   -875 ml   LABS: Basic Metabolic Panel:  Recent Labs  03/29/15 1628 03/30/15 0301 04/01/15 0345  NA 136 137 138  K 3.4* 3.7 3.6  CL 99* 101 103  CO2 27 27 26   GLUCOSE 95 116* 124*  BUN 5* <5* <5*  CREATININE 0.66 0.64 0.59*  CALCIUM 8.6* 8.3* 8.4*  MG 2.1  --   --    CBC:  Recent Labs  03/29/15 1628 03/30/15 0301 03/31/15 2200  WBC 21.3* 16.8*  --   NEUTROABS 18.2*  --   --   HGB 8.2* 7.6* 8.0*  HCT 25.6* 25.6* 27.0*  MCV 76.9* 78.0  --   PLT 472* 408*  --      Radiology/Studies:  No new data  Physical Exam: Blood pressure 117/82, pulse 135, temperature 100.3 F (37.9 C), temperature source Oral, resp. rate 23, height 5\' 10"  (1.778 m), weight 171 lb 8.3 oz (77.8 kg), SpO2 92 %. Weight change:   Wt Readings from Last 3 Encounters:  03/31/15 171 lb 8.3 oz (77.8 kg)  03/30/15 168 lb 3.4 oz (76.3 kg)  03/26/15 169 lb 8 oz (76.885 kg)   Chest clear 2 componet Pericardial rub . Systolic murmur also heard.  ASSESSMENT:  1. 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 Pericarditis with severe pain post pericardiocentesis 3. Widely disseminated Nodular Sclerosing Hodgkin's Lymphoma  Plan:  1. Drain x 24 hours if able to tolerate the drain 2. 2 D echo in AM and pull if effusion gone and no significant drainage. 3. Will keep Oncology, Dr. Julien Nordmann, posted reference to course.  Jeffery Crane 04/01/2015, 9:34 AM

## 2015-04-01 NOTE — Progress Notes (Signed)
Pt has refused any blood draws at this time.

## 2015-04-01 NOTE — Interval H&P Note (Signed)
History and Physical Interval Note:  04/01/2015 7:43 AM  Jeffery Crane  has presented today for surgery, with the diagnosis of pericardial effussion  The various methods of treatment have been discussed with the patient and family. After consideration of risks, benefits and other options for treatment, the patient has consented to  Procedure(s): Pericardiocentesis (N/A) as a surgical intervention .  The patient's history has been reviewed, patient examined, no change in status, stable for surgery.  I have reviewed the patient's chart and labs.  Questions were answered to the patient's satisfaction.     Sherren Mocha

## 2015-04-01 NOTE — Progress Notes (Signed)
Spoke with Dr. Jules Husbands concerning pt's temperature. Blood and urine cultures ordered.

## 2015-04-01 NOTE — Progress Notes (Signed)
Pt's temperature elevated. Pt has many blankets, heat at highest level , and door closed. Pt states this is the only way he can sleep. Pt given tylenol per PRN order with small sip of water. Explained to pt the risks of elevated fever and that he needs to reduce bed coverings and leave door open to see if his fever is reduced. Pt verbalizes understanding. Will retake temperature.

## 2015-04-01 NOTE — Progress Notes (Signed)
Dr. Tamala Julian in to see. C/O pain/restless. Repositioned patient, HOB up. Medicated w/hydrocodone

## 2015-04-01 NOTE — Care Management Note (Signed)
Case Management Note  Patient Details  Name: Jeffery Crane MRN: [id number] Date of Birth: [birth year]  Subjective/Objective:         Adm w pericardial effusion          Action/Plan: lives w fam, pcp [facility]   Expected Discharge Date:                 Expected Discharge Plan:  Home/Self Care  In-House Referral:     Discharge planning Services     Post Acute Care Choice:    Choice offered to:     DME Arranged:    DME Agency:     HH Arranged:    [place] Agency:     Status of Service:     Medicare Important Message Given:    Date Medicare IM Given:    Medicare IM give by:    Date Additional Medicare IM Given:    Additional Medicare Important Message give by:     If discussed at [place] of Stay Meetings, dates discussed:    Additional Comments: ur review done  Lacretia Leigh, RN 04/01/2015, 7:47 AM

## 2015-04-02 ENCOUNTER — Other Ambulatory Visit (HOSPITAL_COMMUNITY): Payer: Self-pay

## 2015-04-02 ENCOUNTER — Ambulatory Visit: Payer: Self-pay

## 2015-04-02 ENCOUNTER — Inpatient Hospital Stay (HOSPITAL_COMMUNITY): Payer: Self-pay

## 2015-04-02 ENCOUNTER — Other Ambulatory Visit: Payer: Self-pay | Admitting: Medical Oncology

## 2015-04-02 ENCOUNTER — Telehealth: Payer: Self-pay | Admitting: Internal Medicine

## 2015-04-02 ENCOUNTER — Other Ambulatory Visit: Payer: Self-pay | Admitting: General Surgery

## 2015-04-02 ENCOUNTER — Telehealth: Payer: Self-pay | Admitting: *Deleted

## 2015-04-02 ENCOUNTER — Encounter: Payer: Self-pay | Admitting: Nutrition

## 2015-04-02 DIAGNOSIS — I319 Disease of pericardium, unspecified: Secondary | ICD-10-CM

## 2015-04-02 LAB — MISC LABCORP TEST (SEND OUT)
LABCORP TEST CODE: 19588
Labcorp test code: 100479
Labcorp test code: 19497

## 2015-04-02 LAB — ECHOCARDIOGRAM LIMITED
HEIGHTINCHES: 70 in
Weight: 2744.29 oz

## 2015-04-02 LAB — ECHOCARDIOGRAM COMPLETE
Height: 70 in
Weight: 2744.29 oz

## 2015-04-02 MED ORDER — LIDOCAINE HCL 1 % IJ SOLN
10.0000 mL | Freq: Once | INTRAMUSCULAR | Status: AC
  Administered 2015-04-02: 10 mL
  Filled 2015-04-02: qty 10

## 2015-04-02 MED ORDER — MIDAZOLAM HCL 2 MG/2ML IJ SOLN
1.0000 mg | Freq: Once | INTRAMUSCULAR | Status: AC
  Administered 2015-04-02: 1 mg via INTRAVENOUS
  Filled 2015-04-02: qty 2

## 2015-04-02 MED ORDER — FENTANYL CITRATE (PF) 100 MCG/2ML IJ SOLN
50.0000 ug | Freq: Once | INTRAMUSCULAR | Status: AC
  Administered 2015-04-02: 50 ug via INTRAVENOUS
  Filled 2015-04-02: qty 2

## 2015-04-02 MED ORDER — FENTANYL CITRATE (PF) 100 MCG/2ML IJ SOLN: 50.0000 ug | Freq: Once | INTRAMUSCULAR | Status: DC

## 2015-04-02 MED ORDER — DOXYCYCLINE HYCLATE 100 MG PO TABS
100.0000 mg | ORAL_TABLET | Freq: Two times a day (BID) | ORAL | Status: DC
  Administered 2015-04-02 – 2015-04-03 (×3): 100 mg via ORAL
  Filled 2015-04-02 (×3): qty 1

## 2015-04-02 NOTE — Telephone Encounter (Signed)
R/s 3/31 appt due to patient being in hospital per 3/31 pof

## 2015-04-02 NOTE — Progress Notes (Addendum)
       Patient Name: Jeffery Crane Date of Encounter: 04/02/2015    SUBJECTIVE:Post pericardiocentesis. Positional chest pain. No problems over night.  TELEMETRY:  NSR Filed Vitals:   04/02/15 0300 04/02/15 0400 04/02/15 0500 04/02/15 0600  BP: 121/86 112/87 117/74 126/81  Pulse: 79 83 88 90  Temp:  97.5 F (36.4 C)    TempSrc:  Oral    Resp: 23 20 20 20   Height:      Weight:      SpO2: 100% 97% 100% 98%    Intake/Output Summary (Last 24 hours) at 04/02/15 0658 Last data filed at 04/02/15 0600  Gross per 24 hour  Intake 2800.83 ml  Output   1645 ml  Net 1155.83 ml   LABS: Basic Metabolic Panel:  Recent Labs  04/01/15 0345  NA 138  K 3.6  CL 103  CO2 26  GLUCOSE 124*  BUN <5*  CREATININE 0.59*  CALCIUM 8.4*   CBC:  Recent Labs  03/31/15 2200 04/01/15 1806  WBC  --  22.4*  HGB 8.0* 9.0*  HCT 27.0* 29.9*  MCV  --  78.5  PLT  --  457*    Pericardial fluid results: Cytology, cultures are pending WBG 37K in fluid with 98% lymphs, 1% neutrophils, and 1% monocytes  Radiology/Studies:  None  Physical Exam: Blood pressure 126/81, pulse 90, temperature 97.5 F (36.4 C), temperature source Oral, resp. rate 20, height 5\' 10"  (1.778 m), weight 171 lb 8.3 oz (77.8 kg), SpO2 98 %. Weight change:   Wt Readings from Last 3 Encounters:  03/31/15 171 lb 8.3 oz (77.8 kg)  03/30/15 168 lb 3.4 oz (76.3 kg)  03/26/15 169 lb 8 oz (76.885 kg)   Chest is clear Pericardial rub now present  ASSESSMENT:  1. Pericardial effusion with 37K WBC in fluid of which 98% are lymphocytes raising concern for malignant effusion 2. Nodular sclerosing Hodgkin's Lymphoma  Plan:  1. Early echo this morning. 2. May pull drain toay 3. May discharge today 4. Coordinate care with oncology, Dr. Julien Nordmann - home later today if no problems after drain is out. 5. Doxycycline 100 mg BID x 1 week  Demetrios Isaacs 04/02/2015, 6:58 AM

## 2015-04-02 NOTE — Progress Notes (Unsigned)
Patient in hospital and unable to attend chemo education class.  Informed Dr Worthy Flank nurse Abelina Bachelor RN for reschedule

## 2015-04-02 NOTE — Progress Notes (Addendum)
   Pericardial drain removed under sterile conditions. Immediately upon removal that was arterial bleeding of significance. Manual compression for 5 minutes lead to hemostasis. I this arterial bleeding to be odd , and wonder about the source. Suspect this was related to subcostal or mammary arterial injury. Other possibilities could include liver or heart. Observation for 10 minutes did not demonstrate any recurrent evidence of paracardial bleeding. No dyspnea. No change in arterial blood pressure.  A stat reluctant echo is ordered to be certain that the bleeding was not cardiac.  Potentially able to go home late this evening if no bleeding and the patient is able to ambulate. I would not rush the patient out and if there are any concerns about bleeding or pain control, he needs to be held until a.m.

## 2015-04-02 NOTE — Progress Notes (Signed)
   Echo before pericardial drain removed showed marked reduction in effusion back to near normal.  Drain removal associated with brief systemic bleeding from the percutaneous drain site that resolved after 10 minutes of manual compression.  Urgent repeat echo showed slight but definite acute re-accumulation of pericardial fluid.  Currently lying flat with, asleep, HR 98 bpm, BP 112/90 mmHg, O2 sat 100%  Plan observe overnight, Repeat limited echo in AM prior to anticipated discharge.

## 2015-04-02 NOTE — Progress Notes (Signed)
  Echocardiogram 2D Echocardiogram has been performed.  Darlina Sicilian M 04/02/2015, 10:30 AM

## 2015-04-02 NOTE — Progress Notes (Signed)
  Echocardiogram 2D Echocardiogram has been performed.  Darlina Sicilian M 04/02/2015, 8:36 AM

## 2015-04-03 ENCOUNTER — Inpatient Hospital Stay (HOSPITAL_COMMUNITY): Payer: Self-pay

## 2015-04-03 ENCOUNTER — Other Ambulatory Visit: Payer: Self-pay | Admitting: Physician Assistant

## 2015-04-03 DIAGNOSIS — I313 Pericardial effusion (noninflammatory): Secondary | ICD-10-CM

## 2015-04-03 DIAGNOSIS — I319 Disease of pericardium, unspecified: Secondary | ICD-10-CM

## 2015-04-03 DIAGNOSIS — I3139 Other pericardial effusion (noninflammatory): Secondary | ICD-10-CM

## 2015-04-03 LAB — ECHOCARDIOGRAM LIMITED
Height: 70 in
WEIGHTICAEL: 2744.29 [oz_av]

## 2015-04-03 MED ORDER — DOXYCYCLINE HYCLATE 100 MG PO TABS: 100.0000 mg | ORAL_TABLET | Freq: Two times a day (BID) | ORAL | Status: DC

## 2015-04-03 MED ORDER — SODIUM CHLORIDE 0.9 % IV SOLN: Freq: Once | INTRAVENOUS | Status: DC

## 2015-04-03 NOTE — Discharge Summary (Signed)
Discharge Summary    Patient ID: Jeffery Crane,  MRN: [id number], DOB/AGE: 1995-10-14 20 y.o.  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 Amoxicillin-Pot Clavulanate Hives and Rash    Has patient had a PCN reaction causing immediate rash, facial/tongue/throat swelling, SOB or lightheadedness with hypotension:  Has patient had a PCN reaction causing severe rash involving mucus membranes or skin necrosis:  Has patient had a PCN reaction that required hospitalization  Has patient had a PCN reaction occurring within the last 10 years:  If all of the above answers are "NO", then may proceed with Cephalosporin use.   . Bactrim Other (See Comments)    Unknown children reaction per pt's mom    Diagnostic Studies/Procedures    Post pericardiocentesis/drain removal echocardiogram  March 31st 2017  Study Conclusions  - Left ventricle: The cavity size was normal. Systolic function was  normal. The estimated ejection fraction was in the range of 60%  to 65%. - Pericardium, extracardiac: A mild pericardial effusion was  identified posterior to the heart. Features were not consistent  with tamponade physiology.  Impressions:  - This was a limited study post removal of pericardial drain.  There is only mild posteriorly located pericardial effusion with  no signs of tamponade.  There is no difference when compared to the prior study performed  earlier today.   Post pericardiocentesis echo Study Conclusions  - Left ventricle: The cavity size was normal. There was mild  concentric hypertrophy. Systolic function  was normal. The  estimated ejection fraction was in the range of 55% to 60%. Wall  motion was normal; there were no regional wall motion  abnormalities. Left ventricular diastolic function parameters  were normal. - Aortic valve: Trileaflet; normal thickness leaflets. There was no  regurgitation. - Aortic root: The aortic root was normal in size. - Left atrium: The atrium was normal in size. - Right ventricle: The cavity size was normal. Wall thickness was  normal. Systolic function was normal. - Right atrium: The atrium was normal in size. - Tricuspid valve: There was trivial regurgitation. - Pulmonary arteries: Systolic pressure was within the normal  range. - Inferior vena cava: The vessel was normal in size. - Pericardium, extracardiac: A mild pericardial effusion was  identified. Features were not consistent with tamponade  physiology.  Impressions:  - When compared to the prior study from 03/29/2015 a large  pericardial effusion and tamponade physiology are no longer  present.  Now, there is only mild posteriorly located pericardial effusion  with no tamponade.  Echocardiogram 04/03/2015   _____________   History of Present Illness      Jeffery Crane is a 20 y.o. male with a history of admission in Jan with fevers, chills and productive cough. CT of chest withcavitary right upper lobe lesion. There also was hilar and mediastinal adenopathy. Rt cervical and supraclavicular lymph node biopsy was consistent with classical Hodgkin lymphoma, nodular sclerosis subtype. Jeffery Crane planning PET scan, CT guided bone marrow biopsy and aspirate and echo.   Echo 03/27 showed large pericardial effusion, concern for tamponade, but pt was asymptomatic and it was felt this could be followed  closely as an outpatient. He was to start chemo and hopefully the effusion would decrease as the lymphoma was treated.   The PET scan was performed 03/28,results are below. It showed  multiple areas of enhancement, plus an increase in the size of the effusion compared to the CT performed 03/01/2015. Jeffery Crane was contacted and requested to come in for admission.   Jeffery Crane feels generally bad and is struggling with the bad news. He is not SOB, has had no presyncope and no chest pain. He is able to ambulate without difficulty, but has not felt like doing much.   Crane Course     Consultants:  None   Patient was admitted for pericardiocentesis. The procedure was completed on 04/01/2015 and resulted in 550 mL of serosanguineous fluid being removed.  Hematology of the fluid showed WBCs of 37,000 lymphs 98%, a few mesothelial cells.  Patient with a pericardial drain removed on March 31. Immediately upon removal was arterial bleeding of significance. Manual compression applied with hemostasis systolic to be related to subcostal or mammary artery injury. Stat echocardiogram revealed a large pericardial effusion and tamponade physiology was no longer present. Was only mild posteriorly located pericardial effusion with no tamponade.  Hemoglobin decreased to 7.6 initially and then at final check it was 9.0.   Patient was seen by Jeffery Crane 04/03/2015 and deemed stable for discharge home. He personally reviewed the echocardiogram showed a small moderate recurrent effusion. We will recheck echocardiogram on Wednesday.   If continues to re- accumulate, he will need pericardial window.  Continue Doxycycline 100 mg BID x 1 week per Jeffery Crane _____________  Discharge Vitals Blood pressure 121/76, pulse 104, temperature 98.8 F (37.1 C), temperature source Oral, resp. rate 24, height '5\' 10"'  (1.778 m), weight 171 lb 8.3 oz (77.8 kg), SpO2 98 %.  Filed Weights   03/31/15 1708 03/31/15 1740  Weight: 171 lb 8.3 oz (77.8 kg) 171 lb 8.3 oz (77.8 kg)    Labs & Radiologic Studies    CBC  Recent Labs  03/31/15 2200 04/01/15 1806  WBC  --  22.4*  HGB 8.0* 9.0*  HCT 27.0* 29.9*  MCV  --   78.5  PLT  --  229*   Basic Metabolic Panel  Recent Labs  04/01/15 0345  NA 138  K 3.6  CL 103  CO2 26  GLUCOSE 124*  BUN <5*  CREATININE 0.59*  CALCIUM 8.4*   Liver Function Tests  Recent Labs  04/01/15 0345  AST 29  ALT 26  ALKPHOS 186*  BILITOT 0.3  PROT 7.0  ALBUMIN 2.0*    Dg Chest 2 View  03/15/2015  CLINICAL DATA:  Supraclavicular and mediastinal adenopathy. EXAM: CHEST  2 VIEW COMPARISON:  Chest x-ray dated 01/18/2015 and CT scan dated 03/01/2015 FINDINGS: The cavitary infiltrate in the anterior aspect of the right upper lobe has slightly regressed. The mediastinal and right hilar adenopathy has also slightly regressed. Heart size and vascularity are normal. No effusions. Bones are normal. IMPRESSION: Interval slight regression of the cavitary right upper lobe infiltrate infiltrate and adenopathy. Electronically Signed   By: Lorriane Shire M.D.   On: 03/15/2015 07:16   Nm Pet Image Initial (pi) Skull Base To Thigh  03/31/2015  CLINICAL DATA:  Initial treatment strategy for Hodgkin's lymphoma. EXAM: NUCLEAR MEDICINE PET SKULL BASE TO THIGH TECHNIQUE: 8.3 mCi F-18 FDG was injected intravenously. Full-ring PET imaging was performed from the skull base to thigh after the radiotracer. CT data  was obtained and used for attenuation correction and anatomic localization. FASTING BLOOD GLUCOSE:  Value: 99 mg/dl COMPARISON:  CT 07/29/2015, 01/19/2015 FINDINGS: NECK Enlarged bulky hypermetabolic bladder supraclavicular nodes with SUV max equal 17. CHEST Enlarged RIGHT axillary lymph nodes. Individual lymph nodes measure 2 cm short axis with SUV max 18.1. The RIGHT upper lobe cavitary mass is again demonstrated measuring approximately 9.6 x 4.5 cm. The mass is intensely hypermetabolic with SUV max 36.1. Hyper meta paratracheal, subcarinal and bilateral hilar lymph nodes which are equally intense. There is a new large pericardial effusion. The effusion measures 39 mm in depth adjacent to  the LEFT ventricle which is increased from 1-2 mm on comparison exam CT 03/01/2015. Effusion anterior to the RIGHT ventricle measures 11 mm slightly increased from 9 mm on prior. ABDOMEN/PELVIS Although spleen is not enlarged, it is relative increased in hypermetabolic activity compared to the liver with SUV max equal 4.2 compared to 2.8. No hypermetabolic lymph nodes in the abdomen or pelvis. SKELETON There is diffuse intensely hypermetabolic bone marrow activity throughout the and higher axillary and appendicular skeleton. For example in the posterior LEFT iliac bone SUV max equals 7.8. IMPRESSION: 1. Hypermetabolic supraclavicular, RIGHT axillary and mediastinal adenopathy consistent lymphoma. 2. Intensely hypermetabolic RIGHT upper lobe cavitary mass consistent with lymphoma. 3. Hypermetabolic but normal size spleen is concerning for lymphoma involvement. 4. Intense metabolic activity throughout the entire marrow space is consistent with lymphoma involvement of the skeleton. 5. Large pericardial effusion increased from CT 2 days prior. The pericardial effusion was discussed with Jeffery. Julien Crane [facility] with recommendation for cardiology / cardiothoracic surgery consultation. 03/31/2015 at09:46. Electronically Signed   By: Suzy Bouchard M.D.   On: 03/31/2015 09:50   Disposition   Pt is being discharged home today in good condition.  Follow-up Plans & Appointments    Follow-up Information    Follow up with Richardson Dopp, PA-C On 04/09/2015.   Specialties:  Physician Assistant, Radiology, Interventional Cardiology   Why:  Your appointment is scheduled at 12:10 PM   Contact information:   [address] [place] [address] [phone]      Discharge Instructions    Diet - low sodium heart healthy    Complete by:  As directed      Increase activity slowly    Complete by:  As directed            Discharge Medications   Current Discharge Medication List    START taking  these medications   Details  doxycycline (VIBRA-TABS) 100 MG tablet Take 1 tablet (100 mg total) by mouth every 12 (twelve) hours. Qty: 14 tablet, Refills: 0      CONTINUE these medications which have NOT CHANGED   Details  allopurinol (ZYLOPRIM) 100 MG tablet Take 1 tablet (100 mg total) by mouth 2 (two) times daily. 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 Qty: 30 tablet, Refills: 0    prochlorperazine (COMPAZINE) 10 MG tablet Take 1 tablet (10 mg total) by mouth every 6 (six) hours as needed  for nausea or vomiting. 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  Otilio Connors [facility] 04/03/2015, 10:57 AM  Patient seen and examined with Tarri Fuller, PA-C. We discussed all aspects of the encounter. I agree with the assessment and plan as stated above. Echo reviewed personally. Mild residual effusion is stable. F/u later this week with repeat echo.  Kentrel Clevenger,MD 10:26 PM

## 2015-04-03 NOTE — Progress Notes (Signed)
Discharge instructions reviewed with patient. Patient verbalized understanding. Pt discharged with his girlfriend.

## 2015-04-03 NOTE — Progress Notes (Signed)
  Echocardiogram 2D Echocardiogram has been performed.  Jeffery Crane 04/03/2015, 8:46 AM

## 2015-04-03 NOTE — Progress Notes (Signed)
       Patient Name: Jeffery Crane Date of Encounter: 04/03/2015    SUBJECTIVE:  Feels ok. Wants to go home. Echo from this am reviewed personally. Has small to moderate recurrent effusion. Unchanged from yesterday.    TELEMETRY:  NSR Filed Vitals:   04/03/15 0404 04/03/15 0500 04/03/15 0600 04/03/15 0726  BP: 120/62   121/76  Pulse: 104 105 102 104  Temp: 98.6 F (37 C)   98.8 F (37.1 C)  TempSrc: Oral   Oral  Resp: 27 25 26 24   Height:      Weight:      SpO2: 98% 98% 96% 98%    Intake/Output Summary (Last 24 hours) at 04/03/15 1000 Last data filed at 04/03/15 0700  Gross per 24 hour  Intake 1555.83 ml  Output    550 ml  Net 1005.83 ml   LABS: Basic Metabolic Panel:  Recent Labs  04/01/15 0345  NA 138  K 3.6  CL 103  CO2 26  GLUCOSE 124*  BUN <5*  CREATININE 0.59*  CALCIUM 8.4*   CBC:  Recent Labs  03/31/15 2200 04/01/15 1806  WBC  --  22.4*  HGB 8.0* 9.0*  HCT 27.0* 29.9*  MCV  --  78.5  PLT  --  457*    Pericardial fluid results: Cytology, cultures are pending WBG 37K in fluid with 98% lymphs, 1% neutrophils, and 1% monocytes  Radiology/Studies:  None  Physical Exam: Blood pressure 121/76, pulse 104, temperature 98.8 F (37.1 C), temperature source Oral, resp. rate 24, height 5\' 10"  (1.778 m), weight 77.8 kg (171 lb 8.3 oz), SpO2 98 %. Weight change:   Wt Readings from Last 3 Encounters:  03/31/15 77.8 kg (171 lb 8.3 oz)  03/30/15 76.3 kg (168 lb 3.4 oz)  03/26/15 76.885 kg (169 lb 8 oz)   Chest is clear Regualr tachy. Soft run Lungs clear Ab soft NT/ND Ext warm no edema  ASSESSMENT:  1. Malignant Pericardial effusion with 37K WBC in fluid of which 98% are lymphocytes raising concern for malignant effusion 2. Nodular sclerosing Hodgkin's Lymphoma  Plan:   Echo from this am reviewed personally. Has small to moderate recurrent effusion. Unchanged from yesterday. Hemodynamically stable.   Gave him the option of going home  or staying one more day for repeat echo tomorrow. He wants to go home.  Will need echo middle of this week to reassess effusion. If continues to re- accumulate (which I suspect it might) - will need pericardial window.   Needs close f/u with Drs. Tamala Julian and [place].   Continue Doxycycline 100 mg BID x 1 week per Dr. Tamala Julian.   Signed, Glori Bickers MD 04/03/2015, 10:00 AM

## 2015-04-05 ENCOUNTER — Ambulatory Visit (HOSPITAL_COMMUNITY): Payer: Self-pay

## 2015-04-05 ENCOUNTER — Telehealth: Payer: Self-pay | Admitting: *Deleted

## 2015-04-05 LAB — PH, BODY FLUID: pH, Body Fluid: 7.4

## 2015-04-05 NOTE — Telephone Encounter (Signed)
  Pt no show 3/27 PFT appt  R/s with Baxter Flattery for 4/4 at [facility]. Called pt to discuss missed PFT appt 3/27. Pt states " I just didn't feel like it. I was in pain and it was too late by the time I woke up." Discussed with pt the importance of keeping 4/4 PFT appt as this test must be completed prior to chemotherapy. Reviewed with pt the appts on 4/4 one at a time and where to go. Pt verbalized understanding, no further concerns.

## 2015-04-06 ENCOUNTER — Encounter: Payer: Self-pay | Admitting: *Deleted

## 2015-04-06 ENCOUNTER — Other Ambulatory Visit: Payer: Self-pay

## 2015-04-06 ENCOUNTER — Ambulatory Visit: Payer: Self-pay

## 2015-04-06 ENCOUNTER — Ambulatory Visit (HOSPITAL_COMMUNITY)
Admission: RE | Admit: 2015-04-06 | Discharge: 2015-04-06 | Disposition: A | Discharge status: Home / Self Care | Payer: Self-pay | Source: Ambulatory Visit | Attending: Internal Medicine | Admitting: Internal Medicine

## 2015-04-06 ENCOUNTER — Encounter: Payer: Self-pay | Admitting: Internal Medicine

## 2015-04-06 ENCOUNTER — Other Ambulatory Visit: Payer: Self-pay | Admitting: *Deleted

## 2015-04-06 ENCOUNTER — Ambulatory Visit (HOSPITAL_BASED_OUTPATIENT_CLINIC_OR_DEPARTMENT_OTHER): Payer: Self-pay | Admitting: Internal Medicine

## 2015-04-06 ENCOUNTER — Other Ambulatory Visit: Payer: Self-pay | Admitting: Medical Oncology

## 2015-04-06 ENCOUNTER — Other Ambulatory Visit (HOSPITAL_BASED_OUTPATIENT_CLINIC_OR_DEPARTMENT_OTHER): Payer: Self-pay

## 2015-04-06 VITALS — Wt 164.1 lb

## 2015-04-06 DIAGNOSIS — C8112 Nodular sclerosis classical Hodgkin lymphoma, intrathoracic lymph nodes: Secondary | ICD-10-CM

## 2015-04-06 DIAGNOSIS — R918 Other nonspecific abnormal finding of lung field: Secondary | ICD-10-CM

## 2015-04-06 DIAGNOSIS — R599 Enlarged lymph nodes, unspecified: Secondary | ICD-10-CM

## 2015-04-06 DIAGNOSIS — R05 Cough: Secondary | ICD-10-CM

## 2015-04-06 LAB — CBC WITH DIFFERENTIAL/PLATELET
BASO%: 0.1 % (ref 0.0–2.0)
BASOS ABS: 0 10*3/uL (ref 0.0–0.1)
EOS%: 0.5 % (ref 0.0–7.0)
Eosinophils Absolute: 0.1 10*3/uL (ref 0.0–0.5)
HCT: 28.2 % — ABNORMAL LOW (ref 38.4–49.9)
HGB: 8.7 g/dL — ABNORMAL LOW (ref 13.0–17.1)
LYMPH#: 2 10*3/uL (ref 0.9–3.3)
LYMPH%: 9 % — AB (ref 14.0–49.0)
MCH: 24 pg — AB (ref 27.2–33.4)
MCHC: 30.9 g/dL — AB (ref 32.0–36.0)
MCV: 77.9 fL — AB (ref 79.3–98.0)
MONO#: 1.2 10*3/uL — ABNORMAL HIGH (ref 0.1–0.9)
MONO%: 5.4 % (ref 0.0–14.0)
NEUT#: 18.6 10*3/uL — ABNORMAL HIGH (ref 1.5–6.5)
NEUT%: 85 % — AB (ref 39.0–75.0)
Platelets: 464 10*3/uL — ABNORMAL HIGH (ref 140–400)
RBC: 3.62 10*6/uL — AB (ref 4.20–5.82)
RDW: 16.8 % — ABNORMAL HIGH (ref 11.0–14.6)
WBC: 21.9 10*3/uL — ABNORMAL HIGH (ref 4.0–10.3)
nRBC: 0 % (ref 0–0)

## 2015-04-06 LAB — PULMONARY FUNCTION TEST
DL/VA % pred: 117 %
DL/VA: 5.54 ml/min/mmHg/L
DLCO COR % PRED: 87 %
DLCO UNC: 22.61 ml/min/mmHg
DLCO cor: 28.4 ml/min/mmHg
DLCO unc % pred: 69 %
FEF 25-75 POST: 2.93 L/s
FEF 25-75 Pre: 2.68 L/sec
FEF2575-%Change-Post: 8 %
FEF2575-%PRED-PRE: 59 %
FEF2575-%Pred-Post: 64 %
FEV1-%Change-Post: 2 %
FEV1-%PRED-POST: 78 %
FEV1-%PRED-PRE: 76 %
FEV1-POST: 3.15 L
FEV1-Pre: 3.08 L
FEV1FVC-%Change-Post: 4 %
FEV1FVC-%PRED-PRE: 88 %
FEV6-%Change-Post: 0 %
FEV6-%PRED-POST: 85 %
FEV6-%Pred-Pre: 85 %
FEV6-POST: 3.98 L
FEV6-Pre: 4 L
FEV6FVC-%CHANGE-POST: 0 %
FEV6FVC-%PRED-POST: 100 %
FEV6FVC-%Pred-Pre: 100 %
FVC-%CHANGE-POST: -2 %
FVC-%Pred-Post: 84 %
FVC-%Pred-Pre: 86 %
FVC-Post: 3.99 L
FVC-Pre: 4.1 L
POST FEV6/FVC RATIO: 100 %
PRE FEV1/FVC RATIO: 75 %
PRE FEV6/FVC RATIO: 100 %
Post FEV1/FVC ratio: 79 %
RV % pred: 66 %
RV: 0.97 L
TLC % PRED: 70 %
TLC: 4.84 L

## 2015-04-06 LAB — CULTURE, BLOOD (ROUTINE X 2)
CULTURE: NO GROWTH
Culture: NO GROWTH

## 2015-04-06 LAB — LACTATE DEHYDROGENASE: LDH: 212 U/L (ref 125–245)

## 2015-04-06 LAB — COMPREHENSIVE METABOLIC PANEL
ALT: 50 U/L (ref 0–55)
ANION GAP: 10 meq/L (ref 3–11)
AST: 61 U/L — AB (ref 5–34)
Albumin: 2.3 g/dL — ABNORMAL LOW (ref 3.5–5.0)
Alkaline Phosphatase: 210 U/L — ABNORMAL HIGH (ref 40–150)
BUN: 7 mg/dL (ref 7.0–26.0)
CHLORIDE: 103 meq/L (ref 98–109)
CO2: 26 meq/L (ref 22–29)
CREATININE: 0.7 mg/dL (ref 0.7–1.3)
Calcium: 9.3 mg/dL (ref 8.4–10.4)
EGFR: >90 mL/min/{1.73_m2} (ref >90)
Glucose: 105 mg/dl (ref 70–140)
POTASSIUM: 3.9 meq/L (ref 3.5–5.1)
Sodium: 140 mEq/L (ref 136–145)
Total Bilirubin: 0.39 mg/dL (ref 0.20–1.20)
Total Protein: 8.1 g/dL (ref 6.4–8.3)

## 2015-04-06 LAB — CULTURE, BODY FLUID W GRAM STAIN -BOTTLE

## 2015-04-06 LAB — CULTURE, BODY FLUID-BOTTLE: CULTURE: NO GROWTH

## 2015-04-06 LAB — URIC ACID: URIC ACID, SERUM: 5.1 mg/dL (ref 2.6–7.4)

## 2015-04-06 MED ORDER — ALBUTEROL SULFATE (2.5 MG/3ML) 0.083% IN NEBU
2.5000 mg | INHALATION_SOLUTION | Freq: Once | RESPIRATORY_TRACT | Status: AC
  Administered 2015-04-06: 2.5 mg via RESPIRATORY_TRACT

## 2015-04-06 MED ORDER — HYDROCODONE-HOMATROPINE 5-1.5 MG/5ML PO SYRP: 5.0000 mL | ORAL_SOLUTION | Freq: Four times a day (QID) | ORAL | Status: DC | PRN

## 2015-04-06 MED ORDER — LIDOCAINE-PRILOCAINE 2.5-2.5 % EX CREA: 1.0000 "application " | TOPICAL_CREAM | CUTANEOUS | Status: DC | PRN

## 2015-04-06 NOTE — Progress Notes (Signed)
[place] [phone]   [phone]  OFFICE PROGRESS NOTE  [facility] [place] [address]  DIAGNOSIS: Stage III/IV nodular sclerosing Hodgkin lymphoma presented with cavitary masses in the right lung in addition to mediastinal, supraclavicular, axillary as well as splenic and bone marrow involvement diagnosed in March 2017  PRIOR THERAPY: Status post pericardiocentesis with drainage of pericardial fluid secondary to Hodgkin's lymphoma. Cytology was negative for lymphoma.  CURRENT THERAPY: Systemic chemotherapy with ABVD. First dose 04/06/2015.  INTERVAL HISTORY: Jeffery Crane 20 y.o. male returns to the clinic today for follow-up visit accompanied by his girlfriend [place]. The patient is feeling a little bit better today after undergoing pericardiocentesis with drainage of 1.5 L of pericardial fluid last week. The final cytology showed no evidence of Hodgkin lymphoma. He continues to have increasing fatigue and weakness as well as shortness breath with exertion. He missed his appointment for the bone marrow biopsy and aspirate yesterday. He underwent pulmonary function test earlier today. The patient also has a chemotherapy education class earlier today. He is here today to start the first cycle of his systemic chemotherapy with ABVD. He lost few pounds. He has no chest pain but continues to have dry cough with no hemoptysis. He denied having any significant fever or chills. He denied having any nausea or vomiting.  MEDICAL HISTORY: Past Medical History  Diagnosis Date  . Adult ADHD   . Pneumonia   . Headache   . GERD (gastroesophageal reflux disease)   . 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  MEDICATIONS:  Current Outpatient Prescriptions  Medication Sig Dispense Refill  .  allopurinol (ZYLOPRIM) 100 MG tablet Take 1 tablet (100 mg total) by mouth 2 (two) times daily. (Patient not taking: Reported on 03/29/2015) 60 tablet 3  . doxycycline (VIBRA-TABS) 100 MG tablet Take 1 tablet (100 mg total) by mouth every 12 (twelve) hours. 14 tablet 0  . HYDROcodone-homatropine (HYCODAN) 5-1.5 MG/5ML syrup Take 5 mLs by mouth every 6 (six) hours as needed for cough. 120 mL 0  . oxyCODONE (OXY IR/ROXICODONE) 5 MG immediate release tablet Take 1-2 tablets (5-10 mg total) by mouth every 6 (six) hours as needed (pain). 30 tablet 0  . prochlorperazine (COMPAZINE) 10 MG tablet Take 1 tablet (10 mg total) by mouth every 6 (six) hours as needed for nausea or vomiting. (Patient not taking: Reported on 03/29/2015) 30 tablet 0   No current facility-administered medications for this visit.    SURGICAL HISTORY:  Past Surgical History  Procedure Laterality Date  . Video bronchoscopy Bilateral 01/22/2015    Procedure: VIDEO BRONCHOSCOPY WITH FLUORO;  Surgeon: Marshell Garfinkel, MD;  Location: [place];  Service: Cardiopulmonary;  Laterality: Bilateral;  . Supraclavical node biopsy Right 03/15/2015    Procedure: RIGHT SUPRACLAVICAL/CERVICAL LYMPH NODE BIOPSY;  Surgeon: [name], MD;  Location: [place];  Service: Thoracic;  Laterality: Right;  . Scalene node biopsy Right 03/15/2015    Procedure: BIOPSY SCALENE NODE;  Surgeon: [name], MD;  Location: [place];  Service: Thoracic;  Laterality: Right;  . Cardiac catheterization N/A 04/01/2015    Procedure: Pericardiocentesis;  Surgeon: Sherren Mocha, MD;  Location: [place] [facility];  Service: Cardiovascular;  Laterality: N/A;    REVIEW OF SYSTEMS:  Constitutional: positive for anorexia, fatigue and weight loss Eyes: negative Ears,  nose, mouth, throat, and face: negative Respiratory: positive for cough and dyspnea on exertion Cardiovascular: negative Gastrointestinal: negative Genitourinary:negative Integument/breast:  negative Hematologic/lymphatic: positive for lymphadenopathy Musculoskeletal:negative Neurological: negative Behavioral/Psych: negative Endocrine: negative Allergic/Immunologic: negative   PHYSICAL EXAMINATION: General appearance: alert, cooperative, fatigued and no distress Head: Normocephalic, without obvious abnormality, atraumatic Neck: marked anterior cervical adenopathy and thyroid not enlarged, symmetric, no tenderness/mass/nodules Lymph nodes: Large right cervical and supraclavicular as well as a right axillary lymphadenopathy Resp: wheezes RUL Back: symmetric, no curvature. ROM normal. No CVA tenderness. Cardio: regular rate and rhythm, S1, S2 normal, no murmur, click, rub or gallop GI: soft, non-tender; bowel sounds normal; no masses,  no organomegaly Extremities: extremities normal, atraumatic, no cyanosis or edema Neurologic: Alert and oriented X 3, normal strength and tone. Normal symmetric reflexes. Normal coordination and gait  ECOG PERFORMANCE STATUS: 1 - Symptomatic but completely ambulatory  Weight 164 lb 1 oz (74.418 kg).  LABORATORY DATA: Lab Results  Component Value Date   WBC 22.4* 04/01/2015   HGB 9.0* 04/01/2015   HCT 29.9* 04/01/2015   MCV 78.5 04/01/2015   PLT 457* 04/01/2015      Chemistry      Component Value Date/Time   NA 138 04/01/2015 0345   NA 137 03/26/2015 1021   NA 138 12/18/2013 0848   K 3.6 04/01/2015 0345   K 3.7 03/26/2015 1021   K 4.0 12/18/2013 0848   CL 103 04/01/2015 0345   CL 105 12/18/2013 0848   CO2 26 04/01/2015 0345   CO2 27 03/26/2015 1021   CO2 28 12/18/2013 0848   BUN <5* 04/01/2015 0345   BUN 5.5* 03/26/2015 1021   BUN 7 12/18/2013 0848   CREATININE 0.59* 04/01/2015 0345   CREATININE 0.8 03/26/2015 1021   CREATININE 0.93 12/18/2013 0848      Component Value Date/Time   CALCIUM 8.4* 04/01/2015 0345   CALCIUM 9.1 03/26/2015 1021   CALCIUM 9.0 12/18/2013 0848   ALKPHOS 186* 04/01/2015 0345   ALKPHOS 172*  03/26/2015 1021   AST 29 04/01/2015 0345   AST 20 03/26/2015 1021   ALT 26 04/01/2015 0345   ALT 21 03/26/2015 1021   BILITOT 0.3 04/01/2015 0345   BILITOT 0.37 03/26/2015 1021       RADIOGRAPHIC STUDIES: Dg Chest 2 View  03/15/2015  CLINICAL DATA:  Supraclavicular and mediastinal adenopathy. EXAM: CHEST  2 VIEW COMPARISON:  Chest x-ray dated 01/18/2015 and CT scan dated 03/01/2015 FINDINGS: The cavitary infiltrate in the anterior aspect of the right upper lobe has slightly regressed. The mediastinal and right hilar adenopathy has also slightly regressed. Heart size and vascularity are normal. No effusions. Bones are normal. IMPRESSION: Interval slight regression of the cavitary right upper lobe infiltrate infiltrate and adenopathy. Electronically Signed   By: Lorriane Shire M.D.   On: 03/15/2015 07:16   Nm Pet Image Initial (pi) Skull Base To Thigh  03/31/2015  CLINICAL DATA:  Initial treatment strategy for Hodgkin's lymphoma. EXAM: NUCLEAR MEDICINE PET SKULL BASE TO THIGH TECHNIQUE: 8.3 mCi F-18 FDG was injected intravenously. Full-ring PET imaging was performed from the skull base to thigh after the radiotracer. CT data was obtained and used for attenuation correction and anatomic localization. FASTING BLOOD GLUCOSE:  Value: 99 mg/dl COMPARISON:  CT 07/29/2015, 01/19/2015 FINDINGS: NECK Enlarged bulky hypermetabolic bladder supraclavicular nodes with SUV max equal 17. CHEST Enlarged RIGHT axillary lymph nodes. Individual lymph nodes measure 2 cm short axis with SUV max 18.1. The RIGHT upper lobe cavitary  mass is again demonstrated measuring approximately 9.6 x 4.5 cm. The mass is intensely hypermetabolic with SUV max 67.0. Hyper meta paratracheal, subcarinal and bilateral hilar lymph nodes which are equally intense. There is a new large pericardial effusion. The effusion measures 39 mm in depth adjacent to the LEFT ventricle which is increased from 1-2 mm on comparison exam CT 03/01/2015. Effusion  anterior to the RIGHT ventricle measures 11 mm slightly increased from 9 mm on prior. ABDOMEN/PELVIS Although spleen is not enlarged, it is relative increased in hypermetabolic activity compared to the liver with SUV max equal 4.2 compared to 2.8. No hypermetabolic lymph nodes in the abdomen or pelvis. SKELETON There is diffuse intensely hypermetabolic bone marrow activity throughout the and higher axillary and appendicular skeleton. For example in the posterior LEFT iliac bone SUV max equals 7.8. IMPRESSION: 1. Hypermetabolic supraclavicular, RIGHT axillary and mediastinal adenopathy consistent lymphoma. 2. Intensely hypermetabolic RIGHT upper lobe cavitary mass consistent with lymphoma. 3. Hypermetabolic but normal size spleen is concerning for lymphoma involvement. 4. Intense metabolic activity throughout the entire marrow space is consistent with lymphoma involvement of the skeleton. 5. Large pericardial effusion increased from CT 2 days prior. The pericardial effusion was discussed with Dr. Julien Nordmann [facility] with recommendation for cardiology / cardiothoracic surgery consultation. 03/31/2015 at09:46. Electronically Signed   By: Suzy Bouchard M.D.   On: 03/31/2015 09:50    ASSESSMENT AND PLAN: This is a very pleasant 20 years old African-American male recently diagnosed with probably stage IV nodular sclerosing Hodgkin lymphoma with bulky lymphadenopathy in the right lower cervical, right supraclavicular, right axillary as well as mediastinal lymph nodes in addition to cavitary lung masses and splenic as well as bone marrow involvement diagnosed in March 2017. Unfortunately the patient missed his bone marrow biopsy and aspirate and I will reschedule this to be done in the next few days. I will also arrange for the patient to have a Port-A-Cath placed by interventional radiology. I recommended for the patient to proceed with his treatment with ABVD today as a scheduled. He would come back for follow-up  visit in 2 weeks for reevaluation and management of any adverse effect of his treatment. He was advised to call immediately if he has any concerning symptoms in the interval. The patient voices understanding of current disease status and treatment options and is in agreement with the current care plan.  All questions were answered. The patient knows to call the clinic with any problems, questions or concerns. We can certainly see the patient much sooner if necessary.  I spent 20 minutes counseling the patient face to face. The total time spent in the appointment was 30 minutes.  Disclaimer: This note was dictated with voice recognition software. Similar sounding words can inadvertently be transcribed and may not be corrected upon review.

## 2015-04-07 LAB — HEPATITIS PANEL, ACUTE
HEP B C IGM: NEGATIVE
HEP B S AG: NEGATIVE
Hep A Ab, IgM: NEGATIVE

## 2015-04-07 LAB — SEDIMENTATION RATE: Sedimentation Rate-Westergren: 55 mm/hr — ABNORMAL HIGH (ref 0–15)

## 2015-04-08 NOTE — Progress Notes (Signed)
Cardiology Office Note:    Date:  04/08/2015   ID:  LABRANDON DILLINGHAM, DOB December 07, 1995, MRN [id number]  PCP:  [place]  Cardiologist:  Dr. Daneen Schick   Electrophysiologist:  n/a  Referring MD: [facility]   Chief Complaint  Patient presents with  . Hospitalization Follow-up    effusion s/p pericardiocentesis    History of Present Illness:     Jeffery Crane is a 20 y.o. male with a hx of    Past Medical History  Diagnosis Date  . Adult ADHD   . Pneumonia   . Headache   . GERD (gastroesophageal reflux disease)   . 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 Video bronchoscopy Bilateral 01/22/2015    Procedure: VIDEO BRONCHOSCOPY WITH FLUORO;  Surgeon: Marshell Garfinkel, MD;  Location: [place];  Service: Cardiopulmonary;  Laterality: Bilateral;  . Supraclavical node biopsy Right 03/15/2015    Procedure: RIGHT SUPRACLAVICAL/CERVICAL LYMPH NODE BIOPSY;  Surgeon: [name], MD;  Location: [facility];  Service: Thoracic;  Laterality: Right;  . Scalene node biopsy Right 03/15/2015    Procedure: BIOPSY SCALENE NODE;  Surgeon: [name], MD;  Location: [facility];  Service: Thoracic;  Laterality: Right;  . Cardiac catheterization N/A 04/01/2015    Procedure: Pericardiocentesis;  Surgeon: Sherren Mocha, MD;  Location: [place];  Service: Cardiovascular;  Laterality: N/A;    Current Medications: Outpatient Prescriptions Prior to Visit  Medication Sig Dispense Refill  . allopurinol (ZYLOPRIM) 100 MG tablet Take 1 tablet (100 mg total) by mouth 2 (two) times daily. (Patient not taking: Reported on 03/29/2015) 60 tablet 3  . doxycycline (VIBRA-TABS) 100 MG tablet Take 1 tablet (100 mg total) by mouth every 12 (twelve) hours. 14 tablet 0  . HYDROcodone-homatropine (HYCODAN) 5-1.5 MG/5ML syrup Take 5 mLs by mouth every 6 (six) hours as  needed for cough. 120 mL 0  . lidocaine-prilocaine (EMLA) cream Apply 1 application topically as needed. 30 g 0  . oxyCODONE (OXY IR/ROXICODONE) 5 MG immediate release tablet Take 1-2 tablets (5-10 mg total) by mouth every 6 (six) hours as needed (pain). 30 tablet 0  . prochlorperazine (COMPAZINE) 10 MG tablet Take 1 tablet (10 mg total) by mouth every 6 (six) hours as needed for nausea or vomiting. (Patient not taking: Reported on 03/29/2015) 30 tablet 0   No facility-administered medications prior to visit.     Allergies:   Amoxicillin-pot clavulanate and Bactrim   Social History   Social History  . Marital Status: Single    Spouse Name: N/A  . Number of Children: N/A  . Years of Education: N/A   Social History Main Topics  . Smoking status: Never Smoker   . Smokeless tobacco: Never Used  . Alcohol Use: Yes     Comment: occasionally  . Drug Use: No  . Sexual Activity: Not on file   Other Topics Concern  . Not on file   Social History Narrative     Family History:  The patient's family history includes Heart disease in his maternal grandfather.   ROS:   Please see the history of present illness.    ROS All other systems reviewed and are negative.   Physical Exam:    VS:  There were no vitals taken for this visit.   GEN: Well nourished, well developed, in no acute distress  HEENT: normal Neck: no JVD, no masses Cardiac: Normal S1/S2, RRR; no murmurs, rubs, or gallops, no edema;   carotid bruits,   Respiratory:  clear to auscultation bilaterally; no wheezing, rhonchi or rales GI: soft, nontender, nondistended MS: no deformity or atrophy Skin: warm and dry Neuro: No focal deficits  Psych: Alert and oriented x 3, normal affect  Wt Readings from Last 3 Encounters:  04/06/15 164 lb 1 oz (74.418 kg)  03/31/15 171 lb 8.3 oz (77.8 kg)  03/30/15 168 lb 3.4 oz (76.3 kg)      Studies/Labs Reviewed:     EKG:  EKG is  ordered today.  The ekg ordered today demonstrates     Recent Labs: 03/29/2015: B Natriuretic Peptide 132.2*; Magnesium 2.1; TSH 0.945 04/06/2015: ALT 50; BUN 7.0; Creatinine 0.7; HGB 8.7*; Platelets 464*; Potassium 3.9; Sodium 140   Recent Lipid Panel No results found for: CHOL, TRIG, HDL, CHOLHDL, VLDL, LDLCALC, LDLDIRECT  Additional studies/ records that were reviewed today include:    Echo 04/03/15 - Left ventricle: The cavity size was normal. There was mild focal  basal hypertrophy of the septum. Systolic function was normal.  The estimated ejection fraction was in the range of 55% to 60%.  Wall motion was normal; there were no regional wall motion  abnormalities. - Right ventricle: Systolic function was mildly to moderately  reduced. - Tricuspid valve: There are faint mobile targets off of the TV on  the ventricular and atrial side which may be redundant or  ruptured chordae but cannot rule out vegetation. - Pericardium, extracardiac: A small, free-flowing pericardial  effusion was identified circumferential to the heart. The fluid  had no internal echoes.There was no evidence of hemodynamic  compromise.  Echo 04/02/15 - Left ventricle: The cavity size was normal. Systolic function was  normal. The estimated ejection fraction was in the range of 60%  to 65%. - Pericardium, extracardiac: A mild pericardial effusion was  identified posterior to the heart. Features were not consistent  with tamponade physiology.  Impressions:  - This was a limited study post removal of pericardial drain.  There is only mild posteriorly located pericardial effusion with  no signs of tamponade.  There is no difference when compared to the prior study performed  earlier today.  Echo 04/02/15 Impressions:  - When compared to the prior study from 03/29/2015 a large  pericardial effusion and tamponade physiology are no longer  present.  Now, there is only mild posteriorly located pericardial effusion  with no  tamponade  Pericardiocentesis 04/01/15 Successful pericardiocentesis with removal of 550 cc serosanguinous fluid  Echo 03/29/15 - Left ventricle: The cavity size was normal. Wall thickness was  normal. Systolic function was normal. The estimated ejection  fraction was in the range of 55% to 60%. Wall motion was normal;  there were no regional wall motion abnormalities. - Pericardium, extracardiac: A large pericardial effusion was  identified.  Impressions:  - Normal LV systolic function; large pericardial effusion with RA  and mild RV collapse; repiratory variation noted with MV inflow;  IVC dilated but collapses; findings concerning for tamponade  physiology; Dr Tamala Julian notified and will evaluate patient.    ASSESSMENT:     No diagnosis found.  PLAN:     In order of problems listed above:  1.   Medication Adjustments/Labs and Tests Ordered: Current medicines are reviewed at length with the patient today.  Concerns regarding medicines are outlined above.  Medication changes, Labs and Tests ordered today are  outlined in the Patient Instructions noted below. There are no Patient Instructions on file for this visit. Signed, Richardson Dopp, PA-C  04/08/2015 10:46 PM    [facility] [address], [place], [place]  [address] Phone: [phone]; Fax: [phone]     This encounter was created in error - please disregard.

## 2015-04-09 ENCOUNTER — Other Ambulatory Visit (HOSPITAL_COMMUNITY): Payer: Self-pay

## 2015-04-09 ENCOUNTER — Ambulatory Visit (HOSPITAL_COMMUNITY): Payer: Self-pay

## 2015-04-09 ENCOUNTER — Encounter: Payer: Self-pay | Admitting: Physician Assistant

## 2015-04-12 ENCOUNTER — Telehealth: Payer: Self-pay | Admitting: Medical Oncology

## 2015-04-12 ENCOUNTER — Telehealth: Payer: Self-pay | Admitting: Internal Medicine

## 2015-04-12 ENCOUNTER — Ambulatory Visit: Payer: Self-pay

## 2015-04-12 NOTE — Progress Notes (Signed)
1354 - Pt no show for chemo appt. Today.   Per Shauna Hugh, RN to Dr Julien Nordmann, pt was to get treated on 04/06/15 but nurses were unable to obtain PIV access; Pt is scheduled for a port placement on 04/14 and treatment to begin after that.  Pt has appt for labs and to see Dr Julien Nordmann on 04/11

## 2015-04-12 NOTE — Telephone Encounter (Signed)
I called pt and told him to cancel lab and f/u appt tomorrow.It was an old appt. Onc tx request sent

## 2015-04-12 NOTE — Progress Notes (Addendum)
Pt declined chemo via peripheral IV in antecubital vein. ALbs drawn and IV discontinued.  He wants to wait for port.

## 2015-04-12 NOTE — Telephone Encounter (Signed)
per pof to CX pt appt-pt aware appt CX for 4/11 per pof

## 2015-04-13 ENCOUNTER — Other Ambulatory Visit: Payer: Self-pay

## 2015-04-13 ENCOUNTER — Ambulatory Visit: Payer: Self-pay | Admitting: Internal Medicine

## 2015-04-14 NOTE — Progress Notes (Signed)
Cardiology Office Note:    Date:  04/15/2015   ID:  Jeffery Crane, DOB [birth year], MRN [id number]  PCP:  [facility]  Cardiologist:  Dr. Daneen Schick   Electrophysiologist:  N/a Oncologist: Dr. Julien Nordmann  Referring MD: [facility]   Chief Complaint  Patient presents with  . Hospitalization Follow-up    s/p pericardiocentesis for effusion    History of Present Illness:     Jeffery Crane is a 20 y.o. male with a hx of newly dx Hodgkin's Lymphoma with cavitary RUL lesion and hilar and mediastinal adenopathy.  He was recently noted to have a large pericardial effusion on Echo.  This was initially followed clinically.  However, PET scan showed increasing size of his effusion and he was admitted 3/29-4/1. He underwent pericardiocentesis 3/30.  Procedure was complicated by subcostal or mammary artery injury with significant bleeding after pericardial drain was removed.  This resolved with manual compression.  Echo prior to DC demonstrated small mod recurrent effusion.   Repeat echo in several days was recommended as well as 1 week of prophylactic antibiotics with Doxycycline.  The echo has not been done yet.  He missed his appointment last week.  He returns for FU.    He is getting a port-a-cath and will start chemo next week.  Notes cough x 1 week.  Worse with lying flat. Denies dyspnea, chest pain.  Denies syncope.     Past Medical History  Diagnosis Date  . Adult ADHD   . Pneumonia   . Headache   . GERD (gastroesophageal reflux disease)   . 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 Video bronchoscopy Bilateral 01/22/2015    Procedure: VIDEO BRONCHOSCOPY WITH FLUORO;  Surgeon: Marshell Garfinkel, MD;  Location: [facility];  Service: Cardiopulmonary;  Laterality: Bilateral;  . Supraclavical node biopsy Right 03/15/2015    Procedure: RIGHT  SUPRACLAVICAL/CERVICAL LYMPH NODE BIOPSY;  Surgeon: [name], MD;  Location: [facility];  Service: Thoracic;  Laterality: Right;  . [place]ene node biopsy Right 03/15/2015    Procedure: BIOPSY SCALENE NODE;  Surgeon: [name], MD;  Location: [facility];  Service: Thoracic;  Laterality: Right;  . Cardiac catheterization N/A 04/01/2015    Procedure: Pericardiocentesis;  Surgeon: Sherren Mocha, MD;  Location: [facility];  Service: Cardiovascular;  Laterality: N/A;    Current Medications: Outpatient Prescriptions Prior to Visit  Medication Sig Dispense Refill  . allopurinol (ZYLOPRIM) 100 MG tablet Take 1 tablet (100 mg total) by mouth 2 (two) times daily. 60 tablet 3  . HYDROcodone-homatropine (HYCODAN) 5-1.5 MG/5ML syrup Take 5 mLs by mouth every 6 (six) hours as needed for cough. 120 mL 0  . lidocaine-prilocaine (EMLA) cream Apply 1 application topically as needed. 30 g 0  . oxyCODONE (OXY IR/ROXICODONE) 5 MG immediate release tablet Take 1-2 tablets (5-10 mg total) by mouth every 6 (six) hours as needed (pain). 30 tablet 0  . prochlorperazine (COMPAZINE) 10 MG tablet Take 1 tablet (10 mg total) by mouth every 6 (six) hours as needed for nausea or vomiting. 30 tablet 0  . doxycycline (VIBRA-TABS) 100 MG tablet Take 1 tablet (100 mg total) by mouth every 12 (twelve) hours. 14 tablet 0   No facility-administered medications prior to visit.     Allergies:   Amoxicillin-pot clavulanate and Bactrim   Social History   Social History  .  Marital Status: Single    Spouse Name: N/A  . Number of Children: N/A  . Years of Education: N/A   Social History Main Topics  . Smoking status: Never Smoker   . Smokeless tobacco: Never Used  . Alcohol Use: Yes     Comment: occasionally  . Drug Use: No  . Sexual Activity: Not Asked   Other Topics Concern  . None   Social History Narrative     Family History:  The patient's    family history includes Heart disease in his maternal  grandfather.   ROS:   Please see the history of present illness.    Review of Systems  Constitution: Positive for decreased appetite, night sweats and weight loss.  Respiratory: Positive for cough.   Musculoskeletal: Positive for back pain.  Gastrointestinal: Positive for constipation.   All other systems reviewed and are negative.   Physical Exam:    VS:  BP 112/50 mmHg  Pulse 114  Ht 5\' 10"  (1.778 m)  Wt 159 lb (72.122 kg)  BMI 22.81 kg/m2   GEN: Well nourished, well developed, in no acute distress HEENT: normal Neck: no JVD, no masses Cardiac: Normal S1/S2,  RRR; no murmurs, rubs, or gallops, no edema    Respiratory:  clear to auscultation bilaterally; no wheezing, rhonchi or rales GI: soft, nontender, nondistended MS: no deformity or atrophy Skin: warm and dry Neuro: No focal deficits  Psych: Alert and oriented x 3, normal affect  Wt Readings from Last 3 Encounters:  04/15/15 159 lb (72.122 kg)  04/06/15 164 lb 1 oz (74.418 kg)  03/31/15 171 lb 8.3 oz (77.8 kg)      Studies/Labs Reviewed:     EKG:  EKG is   ordered today.  The ekg ordered today demonstrates sinus tachy, HR 113, rightward axis, QTc 419 ms, no changes  Recent Labs: 03/29/2015: B Natriuretic Peptide 132.2*; Magnesium 2.1; TSH 0.945 04/06/2015: ALT 50; BUN 7.0; Creatinine 0.7; HGB 8.7*; Platelets 464*; Potassium 3.9; Sodium 140   Recent Lipid Panel No results found for: CHOL, TRIG, HDL, CHOLHDL, VLDL, LDLCALC, LDLDIRECT  Additional studies/ records that were reviewed today include:   Echo 04/03/15 Mild focal basal septal hypertrophy, EF 55-60%, no RWMA, mild to mod reduced RVSF, faint targets off of TV on ventricular and atrial side may be redundant chordae (cannot r/o vegetation), small pericardial effusion  Echo 04/02/15 EF 60-65%, mild pericardial effusion  Echo 04/02/15 Large pericardial effusion resolved  Pericardiocentesis 04/01/15 Successful pericardiocentesis with removal of 550 cc  serosanguinous fluid  Echo 03/29/15 EF 55-60%, no RWMA, large pericardial effusion, findings concerning for tamponade   ASSESSMENT:     1. Pericardial effusion   2. 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 Pericardial effusion - s/p tap.  Cytology was neg.  But, likely effusion is related to Lymphoma.  Reviewed with Dr. Daneen Schick who also saw patient.  No rub on exam.  Will get FU limited echo to recheck effusion.  If re accumulating, he will likely need a pericardial window.  2. Lymphoma - FU with oncology as planned.    Medication Adjustments/Labs and Tests Ordered: Current medicines are reviewed at length with the patient today.  Concerns regarding medicines are outlined above.  Medication changes, Labs and Tests ordered today are outlined in the Patient Instructions noted below. Patient Instructions  Medication Instructions:  Your physician recommends  that you continue on your current medications as directed. Please refer to the Current Medication list given to you today. Labwork: NONE Testing/Procedures: Your physician has requested that you have an LIMITED echocardiogram. PER Cindy Brindisi W. PAC TO HAVE DONE TODAY OR TOMORROW. Echocardiography is a painless test that uses sound waves to create images of your heart. It provides your doctor with information about the size and shape of your heart and how well your heart's chambers and valves are working. This procedure takes approximately one hour. There are no restrictions for this procedure. Follow-Up: DR. Tamala Julian 1 MONTH Any Other Special Instructions Will Be Listed Below (If Applicable). If you need a refill on your cardiac medications before your next appointment, please call your pharmacy.    Signed, Richardson Dopp, PA-C  04/15/2015 1:05 PM    [facility] [address], [place], [place]  [address] Phone: [phone]; Fax:  [phone]

## 2015-04-15 ENCOUNTER — Ambulatory Visit (INDEPENDENT_AMBULATORY_CARE_PROVIDER_SITE_OTHER): Payer: Self-pay | Admitting: Physician Assistant

## 2015-04-15 ENCOUNTER — Other Ambulatory Visit: Payer: Self-pay | Admitting: General Surgery

## 2015-04-15 ENCOUNTER — Encounter: Payer: Self-pay | Admitting: Physician Assistant

## 2015-04-15 VITALS — BP 112/50 | HR 114 | Ht 70.0 in | Wt 159.0 lb

## 2015-04-15 DIAGNOSIS — I3139 Other pericardial effusion (noninflammatory): Secondary | ICD-10-CM

## 2015-04-15 DIAGNOSIS — I319 Disease of pericardium, unspecified: Secondary | ICD-10-CM

## 2015-04-15 DIAGNOSIS — C8112 Nodular sclerosis classical Hodgkin lymphoma, intrathoracic lymph nodes: Secondary | ICD-10-CM

## 2015-04-15 DIAGNOSIS — I313 Pericardial effusion (noninflammatory): Secondary | ICD-10-CM

## 2015-04-15 NOTE — Patient Instructions (Addendum)
Medication Instructions:  Your physician recommends that you continue on your current medications as directed. Please refer to the Current Medication list given to you today. Labwork: NONE Testing/Procedures: Your physician has requested that you have an LIMITED echocardiogram. PER SCOTT W. PAC TO HAVE DONE TODAY OR TOMORROW. Echocardiography is a painless test that uses sound waves to create images of your heart. It provides your doctor with information about the size and shape of your heart and how well your heart's chambers and valves are working. This procedure takes approximately one hour. There are no restrictions for this procedure. Follow-Up: DR. Tamala Julian 1 MONTH Any Other Special Instructions Will Be Listed Below (If Applicable). If you need a refill on your cardiac medications before your next appointment, please call your pharmacy.

## 2015-04-16 ENCOUNTER — Ambulatory Visit (HOSPITAL_BASED_OUTPATIENT_CLINIC_OR_DEPARTMENT_OTHER)
Admission: RE | Admit: 2015-04-16 | Discharge: 2015-04-16 | Disposition: A | Discharge status: Home / Self Care | Payer: Self-pay | Source: Ambulatory Visit | Attending: Physician Assistant | Admitting: Physician Assistant

## 2015-04-16 ENCOUNTER — Other Ambulatory Visit: Payer: Self-pay | Admitting: Internal Medicine

## 2015-04-16 ENCOUNTER — Ambulatory Visit (HOSPITAL_COMMUNITY)
Admission: RE | Admit: 2015-04-16 | Discharge: 2015-04-16 | Disposition: A | Discharge status: Home / Self Care | Payer: Self-pay | Source: Ambulatory Visit | Attending: Internal Medicine | Admitting: Internal Medicine

## 2015-04-16 ENCOUNTER — Encounter (HOSPITAL_COMMUNITY): Payer: Self-pay

## 2015-04-16 DIAGNOSIS — F909 Attention-deficit hyperactivity disorder, unspecified type: Secondary | ICD-10-CM | POA: Insufficient documentation

## 2015-04-16 DIAGNOSIS — I319 Disease of pericardium, unspecified: Secondary | ICD-10-CM

## 2015-04-16 DIAGNOSIS — C8112 Nodular sclerosis classical Hodgkin lymphoma, intrathoracic lymph nodes: Secondary | ICD-10-CM

## 2015-04-16 DIAGNOSIS — Z9889 Other specified postprocedural states: Secondary | ICD-10-CM | POA: Insufficient documentation

## 2015-04-16 DIAGNOSIS — Z8249 Family history of ischemic heart disease and other diseases of the circulatory system: Secondary | ICD-10-CM | POA: Insufficient documentation

## 2015-04-16 DIAGNOSIS — I3139 Other pericardial effusion (noninflammatory): Secondary | ICD-10-CM

## 2015-04-16 DIAGNOSIS — Z8701 Personal history of pneumonia (recurrent): Secondary | ICD-10-CM | POA: Insufficient documentation

## 2015-04-16 DIAGNOSIS — Z881 Allergy status to other antibiotic agents status: Secondary | ICD-10-CM | POA: Insufficient documentation

## 2015-04-16 DIAGNOSIS — I313 Pericardial effusion (noninflammatory): Secondary | ICD-10-CM

## 2015-04-16 DIAGNOSIS — D72829 Elevated white blood cell count, unspecified: Secondary | ICD-10-CM | POA: Insufficient documentation

## 2015-04-16 DIAGNOSIS — Z880 Allergy status to penicillin: Secondary | ICD-10-CM | POA: Insufficient documentation

## 2015-04-16 DIAGNOSIS — D509 Iron deficiency anemia, unspecified: Secondary | ICD-10-CM | POA: Insufficient documentation

## 2015-04-16 DIAGNOSIS — D7589 Other specified diseases of blood and blood-forming organs: Secondary | ICD-10-CM | POA: Insufficient documentation

## 2015-04-16 DIAGNOSIS — K219 Gastro-esophageal reflux disease without esophagitis: Secondary | ICD-10-CM | POA: Insufficient documentation

## 2015-04-16 LAB — CBC
HCT: 28.2 % — ABNORMAL LOW (ref 39.0–52.0)
Hemoglobin: 8.6 g/dL — ABNORMAL LOW (ref 13.0–17.0)
MCH: 23.4 pg — AB (ref 26.0–34.0)
MCHC: 30.5 g/dL (ref 30.0–36.0)
MCV: 76.8 fL — AB (ref 78.0–100.0)
PLATELETS: 577 10*3/uL — AB (ref 150–400)
RBC: 3.67 MIL/uL — ABNORMAL LOW (ref 4.22–5.81)
RDW: 16.3 % — AB (ref 11.5–15.5)
WBC: 25.3 10*3/uL — ABNORMAL HIGH (ref 4.0–10.5)

## 2015-04-16 LAB — BONE MARROW EXAM: Bone Marrow Exam: 268

## 2015-04-16 LAB — APTT: aPTT: 37 seconds (ref 24–37)

## 2015-04-16 LAB — ECHOCARDIOGRAM LIMITED

## 2015-04-16 LAB — PROTIME-INR
INR: 1.3 (ref 0.00–1.49)
Prothrombin Time: 16.4 seconds — ABNORMAL HIGH (ref 11.6–15.2)

## 2015-04-16 MED ORDER — VANCOMYCIN HCL IN DEXTROSE 1-5 GM/200ML-% IV SOLN
1000.0000 mg | INTRAVENOUS | Status: AC
  Administered 2015-04-16: 1000 mg via INTRAVENOUS
  Filled 2015-04-16: qty 200

## 2015-04-16 MED ORDER — MIDAZOLAM HCL 2 MG/2ML IJ SOLN
INTRAMUSCULAR | Status: AC
  Filled 2015-04-16: qty 4

## 2015-04-16 MED ORDER — HEPARIN SOD (PORK) LOCK FLUSH 100 UNIT/ML IV SOLN
INTRAVENOUS | Status: AC
  Filled 2015-04-16: qty 5

## 2015-04-16 MED ORDER — FENTANYL CITRATE (PF) 100 MCG/2ML IJ SOLN
INTRAMUSCULAR | Status: AC | PRN
  Administered 2015-04-16 (×4): 25 ug via INTRAVENOUS

## 2015-04-16 MED ORDER — LIDOCAINE HCL 1 % IJ SOLN
INTRAMUSCULAR | Status: AC
  Filled 2015-04-16: qty 20

## 2015-04-16 MED ORDER — MIDAZOLAM HCL 2 MG/2ML IJ SOLN
INTRAMUSCULAR | Status: AC | PRN
  Administered 2015-04-16 (×2): 0.5 mg via INTRAVENOUS
  Administered 2015-04-16: 1 mg via INTRAVENOUS
  Administered 2015-04-16 (×3): 0.5 mg via INTRAVENOUS

## 2015-04-16 MED ORDER — SODIUM CHLORIDE 0.9 % IV SOLN
INTRAVENOUS | Status: DC
  Administered 2015-04-16: 08:00:00 via INTRAVENOUS

## 2015-04-16 MED ORDER — MIDAZOLAM HCL 2 MG/2ML IJ SOLN
INTRAMUSCULAR | Status: AC
  Filled 2015-04-16: qty 2

## 2015-04-16 MED ORDER — FENTANYL CITRATE (PF) 100 MCG/2ML IJ SOLN
INTRAMUSCULAR | Status: AC
  Filled 2015-04-16: qty 2

## 2015-04-16 MED ORDER — HYDROCODONE-ACETAMINOPHEN 5-325 MG PO TABS
1.0000 | ORAL_TABLET | ORAL | Status: DC | PRN
  Administered 2015-04-16: 1 via ORAL
  Filled 2015-04-16: qty 1
  Filled 2015-04-16 (×2): qty 2

## 2015-04-16 MED ORDER — FENTANYL CITRATE (PF) 100 MCG/2ML IJ SOLN
INTRAMUSCULAR | Status: AC | PRN
  Administered 2015-04-16 (×2): 50 ug via INTRAVENOUS

## 2015-04-16 MED ORDER — MIDAZOLAM HCL 2 MG/2ML IJ SOLN
INTRAMUSCULAR | Status: AC | PRN
  Administered 2015-04-16: 0.5 mg via INTRAVENOUS
  Administered 2015-04-16 (×2): 1 mg via INTRAVENOUS

## 2015-04-16 MED ORDER — HEPARIN SOD (PORK) LOCK FLUSH 100 UNIT/ML IV SOLN
INTRAVENOUS | Status: AC | PRN
  Administered 2015-04-16: 500 [IU]

## 2015-04-16 NOTE — Procedures (Signed)
1.  CT guided RT ILIAC BM ASP AND CORE BX PATH pending   2.  RT IJ SL POWER PORT TIP SVC/RA READY FOR USE  FULL REPORTS IN PACS

## 2015-04-16 NOTE — Sedation Documentation (Signed)
Patient denies pain and is resting comfortably.  

## 2015-04-16 NOTE — Discharge Instructions (Signed)
Bone Marrow Aspiration and Bone Marrow Biopsy, Care After °Refer to this sheet in the next few weeks. These instructions provide you with information about caring for yourself after your procedure. Your health care provider may also give you more specific instructions. Your treatment has been planned according to current medical practices, but problems sometimes occur. Call your health care provider if you have any problems or questions after your procedure. °WHAT TO EXPECT AFTER THE PROCEDURE °After your procedure, it is common to have: °· Soreness or tenderness around the puncture site. °· Bruising. °HOME CARE INSTRUCTIONS °· Take medicines only as directed by your health care provider. °· Follow your health care provider's instructions about: °¨ Puncture site care. °¨ Bandage (dressing) changes and removal. °· Bathe and shower as directed by your health care provider. °· Check your puncture site every day for signs of infection. Watch for: °¨ Redness, swelling, or pain. °¨ Fluid, blood, or pus. °· Return to your normal activities as directed by your health care provider. °· Keep all follow-up visits as directed by your health care provider. This is important. °SEEK MEDICAL CARE IF: °· You have a fever. °· You have uncontrollable bleeding. °· You have redness, swelling, or pain at the site of your puncture. °· You have fluid, blood, or pus coming from your puncture site. °  °This information is not intended to replace advice given to you by your health care provider. Make sure you discuss any questions you have with your health care provider. °  °Document Released: 07/08/2004 Document Revised: 05/05/2014 Document Reviewed: 12/10/2013 °Elsevier Interactive Patient Education ©2016 Elsevier Inc[employer] °Moderate Conscious Sedation, Adult, Care After °Refer to this sheet in the next few weeks. These instructions provide you with information on caring for yourself after your procedure. Your health care provider may also give  you more specific instructions. Your treatment has been planned according to current medical practices, but problems sometimes occur. Call your health care provider if you have any problems or questions after your procedure. °WHAT TO EXPECT AFTER THE PROCEDURE  °After your procedure: °· You may feel sleepy, clumsy, and have poor balance for several hours. °· Vomiting may occur if you eat too soon after the procedure. °HOME CARE INSTRUCTIONS °· Do not participate in any activities where you could become injured for at least 24 hours. Do not: °¨ Drive. °¨ Swim. °¨ Ride a bicycle. °¨ Operate heavy machinery. °¨ Cook. °¨ Use power tools. °¨ Climb ladders. °¨ Work from a high place. °· Do not make important decisions or sign legal documents until you are improved. °· If you vomit, drink water, juice, or soup when you can drink without vomiting. Make sure you have little or no nausea before eating solid foods. °· Only take over-the-counter or prescription medicines for pain, discomfort, or fever as directed by your health care provider. °· Make sure you and your family fully understand everything about the medicines given to you, including what side effects may occur. °· You should not drink alcohol, take sleeping pills, or take medicines that cause drowsiness for at least 24 hours. °· If you smoke, do not smoke without supervision. °· If you are feeling better, you may resume normal activities 24 hours after you were sedated. °· Keep all appointments with your health care provider. °SEEK MEDICAL CARE IF: °· Your skin is pale or bluish in color. °· You continue to feel nauseous or vomit. °· Your pain is getting worse and is not helped by medicine. °·   helped by medicine.  You have bleeding or swelling.  You are still sleepy or feeling clumsy after 24 hours. SEEK IMMEDIATE MEDICAL CARE IF:  You develop a rash.  You have difficulty breathing.  You develop any type of allergic problem.  You have a fever. MAKE SURE  YOU:  Understand these instructions.  Will watch your condition.  Will get help right away if you are not doing well or get worse.   This information is not intended to replace advice given to you by your health care provider. Make sure you discuss any questions you have with your health care provider.   Document Released: 10/09/2012 Document Revised: 01/09/2014 Document Reviewed: 10/09/2012 Elsevier Interactive Patient Education 2016 Elsevier Inc.    Implanted [facility] Guide An implanted port is a type of central line that is placed under the skin. Central lines are used to provide IV access when treatment or nutrition needs to be given through a person's veins. Implanted ports are used for long-term IV access. An implanted port may be placed because:   You need IV medicine that would be irritating to the small veins in your hands or arms.   You need long-term IV medicines, such as antibiotics.   You need IV nutrition for a long period.   You need frequent blood draws for lab tests.   You need dialysis.  Implanted ports are usually placed in the chest area, but they can also be placed in the upper arm, the abdomen, or the leg. An implanted port has two main parts:   Reservoir. The reservoir is round and will appear as a small, raised area under your skin. The reservoir is the part where a needle is inserted to give medicines or draw blood.   Catheter. The catheter is a thin, flexible tube that extends from the reservoir. The catheter is placed into a large vein. Medicine that is inserted into the reservoir goes into the catheter and then into the vein.  HOW WILL I CARE FOR MY INCISION SITE? Do not get the incision site wet. Bathe or shower as directed by your health care provider.  HOW IS MY PORT ACCESSED? Special steps must be taken to access the port:   Before the port is accessed, a numbing cream can be placed on the skin. This helps numb the skin over the port site.    Your health care provider uses a sterile technique to access the port.  Your health care provider must put on a mask and sterile gloves.  The skin over your port is cleaned carefully with an antiseptic and allowed to dry.  The port is gently pinched between sterile gloves, and a needle is inserted into the port.  Only "non-coring" port needles should be used to access the port. Once the port is accessed, a blood return should be checked. This helps ensure that the port is in the vein and is not clogged.   If your port needs to remain accessed for a constant infusion, a clear (transparent) bandage will be placed over the needle site. The bandage and needle will need to be changed every week, or as directed by your health care provider.   Keep the bandage covering the needle clean and dry. Do not get it wet. Follow your health care provider's instructions on how to take a shower or bath while the port is accessed.   If your port does not need to stay accessed, no bandage is needed over the  port.  WHAT IS FLUSHING? Flushing helps keep the port from getting clogged. Follow your health care provider's instructions on how and when to flush the port. Ports are usually flushed with saline solution or a medicine called heparin. The need for flushing will depend on how the port is used.   If the port is used for intermittent medicines or blood draws, the port will need to be flushed:   After medicines have been given.   After blood has been drawn.   As part of routine maintenance.   If a constant infusion is running, the port may not need to be flushed.  HOW LONG WILL MY PORT STAY IMPLANTED? The port can stay in for as long as your health care provider thinks it is needed. When it is time for the port to come out, surgery will be done to remove it. The procedure is similar to the one performed when the port was put in.  WHEN SHOULD I SEEK IMMEDIATE MEDICAL CARE? When you have an  implanted port, you should seek immediate medical care if:   You notice a bad smell coming from the incision site.   You have swelling, redness, or drainage at the incision site.   You have more swelling or pain at the port site or the surrounding area.   You have a fever that is not controlled with medicine.   This information is not intended to replace advice given to you by your health care provider. Make sure you discuss any questions you have with your health care provider.   Document Released: 12/19/2004 Document Revised: 10/09/2012 Document Reviewed: 08/26/2012 Elsevier Interactive Patient Education [employer].

## 2015-04-16 NOTE — Discharge Instructions (Signed)
Implanted Port Home Guide    An implanted port is a type of central line that is placed under the skin. Central lines are used to provide IV access when treatment or nutrition needs to be given through a person's veins. Implanted ports are used for long-term IV access. An implanted port may be placed because:    You need IV medicine that would be irritating to the small veins in your hands or arms.     You need long-term IV medicines, such as antibiotics.     You need IV nutrition for a long period.     You need frequent blood draws for lab tests.     You need dialysis.    Implanted ports are usually placed in the chest area, but they can also be placed in the upper arm, the abdomen, or the leg. An implanted port has two main parts:    Reservoir. The reservoir is round and will appear as a small, raised area under your skin. The reservoir is the part where a needle is inserted to give medicines or draw blood.     Catheter. The catheter is a thin, flexible tube that extends from the reservoir. The catheter is placed into a large vein. Medicine that is inserted into the reservoir goes into the catheter and then into the vein.    HOW WILL I CARE FOR MY INCISION SITE?  Do not get the incision site wet. Bathe or shower as directed by your health care provider.   HOW IS MY PORT ACCESSED?  Special steps must be taken to access the port:    Before the port is accessed, a numbing cream can be placed on the skin. This helps numb the skin over the port site.     Your health care provider uses a sterile technique to access the port.   Your health care provider must put on a mask and sterile gloves.   The skin over your port is cleaned carefully with an antiseptic and allowed to dry.   The port is gently pinched between sterile gloves, and a needle is inserted into the port.   Only "non-coring" port needles should be used to access the port. Once the port is accessed, a blood return should be checked. This helps  ensure that the port is in the vein and is not clogged.     If your port needs to remain accessed for a constant infusion, a clear (transparent) bandage will be placed over the needle site. The bandage and needle will need to be changed every week, or as directed by your health care provider.     Keep the bandage covering the needle clean and dry. Do not get it wet. Follow your health care provider's instructions on how to take a shower or bath while the port is accessed.     If your port does not need to stay accessed, no bandage is needed over the port.    WHAT IS FLUSHING?  Flushing helps keep the port from getting clogged. Follow your health care provider's instructions on how and when to flush the port. Ports are usually flushed with saline solution or a medicine called heparin. The need for flushing will depend on how the port is used.    If the port is used for intermittent medicines or blood draws, the port will need to be flushed:     After medicines have been given.     After blood has been drawn.       provider thinks it is needed. When it is time for the port to come out, surgery will be done to remove it. The procedure is similar to the one performed when the port was put in.  WHEN SHOULD I SEEK IMMEDIATE MEDICAL CARE? When you have an implanted port, you should seek immediate medical care if:   You notice a bad smell coming from the incision site.   You have swelling, redness, or drainage at the incision site.   You have more swelling or pain at the port site or the surrounding area.   You have a fever that is not controlled with medicine.   This information is not intended to replace advice given to you by your health care provider. Make sure you discuss any questions you have with  your health care provider.   Document Released: 12/19/2004 Document Revised: 10/09/2012 Document Reviewed: 08/26/2012 Elsevier Interactive Patient Education 2016 [employer]. Implanted Port Insertion, Care After Refer to this sheet in the next few weeks. These instructions provide you with information on caring for yourself after your procedure. Your health care provider may also give you more specific instructions. Your treatment has been planned according to current medical practices, but problems sometimes occur. Call your health care provider if you have any problems or questions after your procedure. WHAT TO EXPECT AFTER THE PROCEDURE After your procedure, it is typical to have the following:   Discomfort at the port insertion site. Ice packs to the area will help.  Bruising on the skin over the port. This will subside in 3-4 days. HOME CARE INSTRUCTIONS  After your port is placed, you will get a manufacturer's information card. The card has information about your port. Keep this card with you at all times.   Know what kind of port you have. There are many types of ports available.   Wear a medical alert bracelet in case of an emergency. This can help alert health care workers that you have a port.   The port can stay in for as long as your health care provider believes it is necessary.   A home health care nurse may give medicines and take care of the port.   You or a family member can get special training and directions for giving medicine and taking care of the port at home.  SEEK MEDICAL CARE IF:   Your port does not flush or you are unable to get a blood return.   You have a fever or chills. SEEK IMMEDIATE MEDICAL CARE IF:  You have new fluid or pus coming from your incision.   You notice a bad smell coming from your incision site.   You have swelling, pain, or more redness at the incision or port site.   You have chest pain or shortness of breath.   This  information is not intended to replace advice given to you by your health care provider. Make sure you discuss any questions you have with your health care provider.   Document Released: 10/09/2012 Document Revised: 12/24/2012 Document Reviewed: 10/09/2012 Elsevier Interactive Patient Education 2016 [employer]. Bone Marrow Aspiration and Bone Marrow Biopsy, Care After Refer to this sheet in the next few weeks. These instructions provide you with information about caring for yourself after your procedure. Your health care provider may also give you more specific instructions. Your treatment has been planned according to current medical practices, but problems sometimes occur. Call your health care provider if you have any problems or  questions after your procedure. WHAT TO EXPECT AFTER THE PROCEDURE After your procedure, it is common to have:  Soreness or tenderness around the puncture site.  Bruising. HOME CARE INSTRUCTIONS  Take medicines only as directed by your health care provider.  Follow your health care provider's instructions about:  Puncture site care.  Bandage (dressing) changes and removal.  Bathe and shower as directed by your health care provider.  Check your puncture site every day for signs of infection. Watch for:  Redness, swelling, or pain.  Fluid, blood, or pus.  Return to your normal activities as directed by your health care provider.  Keep all follow-up visits as directed by your health care provider. This is important. SEEK MEDICAL CARE IF:  You have a fever.  You have uncontrollable bleeding.  You have redness, swelling, or pain at the site of your puncture.  You have fluid, blood, or pus coming from your puncture site.   This information is not intended to replace advice given to you by your health care provider. Make sure you discuss any questions you have with your health care provider.   Document Released: 07/08/2004 Document Revised:  05/05/2014 Document Reviewed: 12/10/2013 Elsevier Interactive Patient Education 2016 [employer]vier Inc. Moderate Conscious Sedation, Adult, Care After Refer to this sheet in the next few weeks. These instructions provide you with information on caring for yourself after your procedure. Your health care provider may also give you more specific instructions. Your treatment has been planned according to current medical practices, but problems sometimes occur. Call your health care provider if you have any problems or questions after your procedure. WHAT TO EXPECT AFTER THE PROCEDURE  After your procedure:  You may feel sleepy, clumsy, and have poor balance for several hours.  Vomiting may occur if you eat too soon after the procedure. HOME CARE INSTRUCTIONS  Do not participate in any activities where you could become injured for at least 24 hours. Do not:  Drive.  Swim.  Ride a bicycle.  Operate heavy machinery.  Cook.  Use power tools.  Climb ladders.  Work from a high place.  Do not make important decisions or sign legal documents until you are improved.  If you vomit, drink water, juice, or soup when you can drink without vomiting. Make sure you have little or no nausea before eating solid foods.  Only take over-the-counter or prescription medicines for pain, discomfort, or fever as directed by your health care provider.  Make sure you and your family fully understand everything about the medicines given to you, including what side effects may occur.  You should not drink alcohol, take sleeping pills, or take medicines that cause drowsiness for at least 24 hours.  If you smoke, do not smoke without supervision.  If you are feeling better, you may resume normal activities 24 hours after you were sedated.  Keep all appointments with your health care provider. SEEK MEDICAL CARE IF:  Your skin is pale or bluish in color.  You continue to feel nauseous or vomit.  Your pain  is getting worse and is not helped by medicine.  You have bleeding or swelling.  You are still sleepy or feeling clumsy after 24 hours. SEEK IMMEDIATE MEDICAL CARE IF:  You develop a rash.  You have difficulty breathing.  You develop any type of allergic problem.  You have a fever. MAKE SURE YOU:  Understand these instructions.  Will watch your condition.  Will get help right away if you are  not doing well or get worse.   This information is not intended to replace advice given to you by your health care provider. Make sure you discuss any questions you have with your health care provider.   Document Released: 10/09/2012 Document Revised: 01/09/2014 Document Reviewed: 10/09/2012 Elsevier Interactive Patient Education [employer].

## 2015-04-16 NOTE — H&P (Signed)
Chief Complaint: Hodgkin's lymphoma  Referring Physician:Dr. Curt Bears  Supervising Physician: Daryll Brod  HPI: Jeffery Crane is an 20 y.o. male who was recently diagnosed with Hodgkin's lymphoma after presenting with multiple cavitary lung lesions.  He underwent a LN biopsy by [facility] recently that confirmed Hodgkin's lymphoma.  He is followed by Dr. Julien Nordmann.  He would like a bone marrow biopsy as well as a placement of a port a cath in order to be able to start treatment soon.  The patient has had some fatigue recently, but otherwise no other complaints.  He presents today for the above procedures.  Past Medical History:  Past Medical History  Diagnosis Date  . Adult ADHD   . Pneumonia   . Headache   . GERD (gastroesophageal reflux disease)   . 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 Video bronchoscopy Bilateral 01/22/2015    Procedure: VIDEO BRONCHOSCOPY WITH FLUORO;  Surgeon: Marshell Garfinkel, MD;  Location: [place];  Service: Cardiopulmonary;  Laterality: Bilateral;  . Supraclavical node biopsy Right 03/15/2015    Procedure: RIGHT SUPRACLAVICAL/CERVICAL LYMPH NODE BIOPSY;  Surgeon: [name], MD;  Location: [place];  Service: Thoracic;  Laterality: Right;  . Scalene node biopsy Right 03/15/2015    Procedure: BIOPSY SCALENE NODE;  Surgeon: [name], MD;  Location: [place];  Service: Thoracic;  Laterality: Right;  . Cardiac catheterization N/A 04/01/2015    Procedure: Pericardiocentesis;  Surgeon: Sherren Mocha, MD;  Location: [facility];  Service: Cardiovascular;  Laterality: N/A;    Family History:  Family History  Problem Relation Age of Onset  . Heart disease Maternal Grandfather     Social History:  reports that he has never smoked. He has never used smokeless tobacco. He reports that he drinks alcohol. He reports  that he does not use illicit drugs.  Allergies:  Allergies  Allergen Reactions  . Amoxicillin-Pot Clavulanate Hives and Rash    Has patient had a PCN reaction causing immediate rash, facial/tongue/throat swelling, SOB or lightheadedness with hypotension:   Has patient had a PCN reaction causing severe rash involving mucus membranes or skin necrosis:   Has patient had a PCN reaction that required hospitalization  Has patient had a PCN reaction occurring within the last 10 years:   If all of the above answers are "NO", then may proceed with Cephalosporin use.   . Bactrim Other (See Comments)    Unknown children reaction per pt's mom    Medications:   Medication List    ASK your doctor about these medications        allopurinol 100 MG tablet  Commonly known as:  ZYLOPRIM  Take 1 tablet (100 mg total) by mouth 2 (two) times daily.     HYDROcodone-homatropine 5-1.5 MG/5ML syrup  Commonly known as:  HYCODAN  Take 5 mLs by mouth every 6 (six) hours as needed for cough.     lidocaine-prilocaine cream  Commonly known as:  EMLA  Apply 1 application topically as needed.     oxyCODONE 5 MG immediate release tablet  Commonly known as:  Oxy IR/ROXICODONE  Take 1-2 tablets (5-10 mg total) by mouth every 6 (six) hours as needed (pain).     prochlorperazine 10 MG tablet  Commonly known as:  COMPAZINE  Take 1 tablet (10 mg total) by mouth every 6 (six) hours as  needed for nausea or vomiting.        Please HPI for pertinent positives, otherwise complete 10 system ROS negative.  Mallampati Score: MD Evaluation Airway: WNL Heart: WNL Abdomen: WNL Chest/ Lungs: WNL ASA  Classification: 3 Mallampati/Airway Score: Two  Physical Exam: There were no vitals taken for this visit. There is no weight on file to calculate BMI. General: pleasant, WD, WN black male who is laying in bed in NAD HEENT: head is normocephalic, atraumatic.  Sclera are noninjected.  PERRL.  Ears and nose  without any masses or lesions.  Mouth is pink and moist Heart: regular rhythm, but mildly tachy.  Normal s1,s2. No obvious murmurs, gallops, or rubs noted.  Palpable radial and pedal pulses bilaterally Lungs: CTAB, no wheezes, rhonchi, or rales noted.  Respiratory effort nonlabored Abd: soft, NT, ND, +BS, no masses, hernias, or organomegaly MS: all 4 extremities are symmetrical with no cyanosis, clubbing, or edema. Skin: warm and dry with no masses, lesions, or rashes.  Scar at right inferior neck from recent biopsy Psych: A&Ox3 with an appropriate affect.   Labs: Results for orders placed or performed during the hospital encounter of 04/16/15 (from the past 48 hour(s))  APTT upon arrival     Status: None   Collection Time: 04/16/15  7:30 AM  Result Value Ref Range   aPTT 37 24 - 37 seconds    Comment:        IF BASELINE aPTT IS ELEVATED, SUGGEST PATIENT RISK ASSESSMENT BE USED TO DETERMINE APPROPRIATE ANTICOAGULANT THERAPY.   CBC upon arrival     Status: Abnormal   Collection Time: 04/16/15  7:30 AM  Result Value Ref Range   WBC 25.3 (H) 4.0 - 10.5 K/uL   RBC 3.67 (L) 4.22 - 5.81 MIL/uL   Hemoglobin 8.6 (L) 13.0 - 17.0 g/dL   HCT 28.2 (L) 39.0 - 52.0 %   MCV 76.8 (L) 78.0 - 100.0 fL   MCH 23.4 (L) 26.0 - 34.0 pg   MCHC 30.5 30.0 - 36.0 g/dL   RDW 16.3 (H) 11.5 - 15.5 %   Platelets 577 (H) 150 - 400 K/uL  Protime-INR upon arrival     Status: Abnormal   Collection Time: 04/16/15  7:30 AM  Result Value Ref Range   Prothrombin Time 16.4 (H) 11.6 - 15.2 seconds   INR 1.30 0.00 - 1.49    Imaging: No results found.  Assessment/Plan 1. Hodgkin's lymphoma -will proceed with bone marrow biopsy today as well as PAC placement -his WBC are 25K, but his recent WBCs have been elevated like this for the last several months.  This is likely secondary to his malignancy.  He has no infectious symptoms. -d/w Dr. Annamaria Boots and agree that his elevated WBC, tachycardia, and low grade temp are  all side effects of his malignancy. -Risks and Benefits discussed with the patient including, but not limited to bleeding, infection, pneumothorax, or fibrin sheath development and need for additional procedures. All of the patient's questions were answered, patient is agreeable to proceed. Consent signed and in chart. -Risks and Benefits discussed with the patient including, but not limited to bleeding, infection, damage to adjacent structures or low yield requiring additional tests. All of the patient's questions were answered, patient is agreeable to proceed. Consent signed and in chart.   Thank you for this interesting consult.  I greatly enjoyed meeting Jeffery Crane and look forward to participating in their care.  A copy of this report was sent  to the requesting provider on this date.  Electronically Signed: Henreitta Cea 04/16/2015, 8:36 AM   I spent a total of  40 Minutes   in face to face in clinical consultation, greater than 50% of which was counseling/coordinating care for hodgkin's lymphoma, needs biopsy and PAC

## 2015-04-16 NOTE — Progress Notes (Addendum)
  Echocardiogram 2D Echocardiogram has been performed. Per Dr. Acie Fredrickson, I was instructed to take Mr. Dejesus to the emergency room for evaluation of early tamponade. Mr.Graziosi refused to allow me to take him to the emergency room. He stated that "he did not want to stay in the hospital and that he was leaving".   Tassie Pollett 04/16/2015, 1:21 PM

## 2015-04-19 ENCOUNTER — Telehealth: Payer: Self-pay

## 2015-04-19 ENCOUNTER — Other Ambulatory Visit: Payer: Self-pay | Admitting: *Deleted

## 2015-04-19 DIAGNOSIS — I3139 Other pericardial effusion (noninflammatory): Secondary | ICD-10-CM

## 2015-04-19 DIAGNOSIS — I313 Pericardial effusion (noninflammatory): Secondary | ICD-10-CM

## 2015-04-19 DIAGNOSIS — C8112 Nodular sclerosis classical Hodgkin lymphoma, intrathoracic lymph nodes: Secondary | ICD-10-CM

## 2015-04-19 NOTE — Telephone Encounter (Signed)
Per Dr.Smith, pt needs a repeat limited echo repeated on 04/23/15 Dx pericardial effusion. Message fwd to [facility] to call pt to schedule

## 2015-04-20 ENCOUNTER — Encounter: Payer: Self-pay | Admitting: *Deleted

## 2015-04-20 ENCOUNTER — Telehealth: Payer: Self-pay | Admitting: Internal Medicine

## 2015-04-20 ENCOUNTER — Other Ambulatory Visit (HOSPITAL_BASED_OUTPATIENT_CLINIC_OR_DEPARTMENT_OTHER): Payer: Self-pay

## 2015-04-20 ENCOUNTER — Ambulatory Visit (HOSPITAL_BASED_OUTPATIENT_CLINIC_OR_DEPARTMENT_OTHER): Payer: Self-pay

## 2015-04-20 ENCOUNTER — Ambulatory Visit (HOSPITAL_BASED_OUTPATIENT_CLINIC_OR_DEPARTMENT_OTHER): Payer: Self-pay | Admitting: Internal Medicine

## 2015-04-20 ENCOUNTER — Encounter: Payer: Self-pay | Admitting: Internal Medicine

## 2015-04-20 VITALS — BP 119/74 | HR 115 | Temp 98.8°F | Resp 20 | Ht 70.0 in | Wt 159.3 lb

## 2015-04-20 DIAGNOSIS — D72829 Elevated white blood cell count, unspecified: Secondary | ICD-10-CM

## 2015-04-20 DIAGNOSIS — D75839 Thrombocytosis, unspecified: Secondary | ICD-10-CM

## 2015-04-20 DIAGNOSIS — Z5111 Encounter for antineoplastic chemotherapy: Secondary | ICD-10-CM

## 2015-04-20 DIAGNOSIS — J984 Other disorders of lung: Secondary | ICD-10-CM

## 2015-04-20 DIAGNOSIS — C8112 Nodular sclerosis classical Hodgkin lymphoma, intrathoracic lymph nodes: Secondary | ICD-10-CM

## 2015-04-20 DIAGNOSIS — R599 Enlarged lymph nodes, unspecified: Secondary | ICD-10-CM

## 2015-04-20 DIAGNOSIS — D473 Essential (hemorrhagic) thrombocythemia: Secondary | ICD-10-CM

## 2015-04-20 LAB — CBC WITH DIFFERENTIAL/PLATELET
BASO%: 0.2 % (ref 0.0–2.0)
Basophils Absolute: 0.1 10*3/uL (ref 0.0–0.1)
EOS ABS: 0.1 10*3/uL (ref 0.0–0.5)
EOS%: 0.4 % (ref 0.0–7.0)
HCT: 25.9 % — ABNORMAL LOW (ref 38.4–49.9)
HEMOGLOBIN: 7.9 g/dL — AB (ref 13.0–17.1)
LYMPH%: 6.1 % — ABNORMAL LOW (ref 14.0–49.0)
MCH: 23 pg — ABNORMAL LOW (ref 27.2–33.4)
MCHC: 30.5 g/dL — ABNORMAL LOW (ref 32.0–36.0)
MCV: 75.4 fL — AB (ref 79.3–98.0)
MONO#: 1.4 10*3/uL — ABNORMAL HIGH (ref 0.1–0.9)
MONO%: 6.4 % (ref 0.0–14.0)
NEUT%: 86.9 % — ABNORMAL HIGH (ref 39.0–75.0)
NEUTROS ABS: 19.7 10*3/uL — AB (ref 1.5–6.5)
Platelets: 543 10*3/uL — ABNORMAL HIGH (ref 140–400)
RBC: 3.43 10*6/uL — ABNORMAL LOW (ref 4.20–5.82)
RDW: 17.4 % — ABNORMAL HIGH (ref 11.0–14.6)
WBC: 22.6 10*3/uL — AB (ref 4.0–10.3)
lymph#: 1.4 10*3/uL (ref 0.9–3.3)

## 2015-04-20 LAB — COMPREHENSIVE METABOLIC PANEL
ALBUMIN: 2.1 g/dL — AB (ref 3.5–5.0)
ALK PHOS: 251 U/L — AB (ref 40–150)
ALT: 34 U/L (ref 0–55)
AST: 37 U/L — AB (ref 5–34)
Anion Gap: 10 mEq/L (ref 3–11)
BILIRUBIN TOTAL: 0.39 mg/dL (ref 0.20–1.20)
BUN: 5.5 mg/dL — AB (ref 7.0–26.0)
CO2: 26 mEq/L (ref 22–29)
Calcium: 9.1 mg/dL (ref 8.4–10.4)
Chloride: 102 mEq/L (ref 98–109)
Creatinine: 0.7 mg/dL (ref 0.7–1.3)
GLUCOSE: 103 mg/dL (ref 70–140)
Potassium: 3.7 mEq/L (ref 3.5–5.1)
SODIUM: 138 meq/L (ref 136–145)
TOTAL PROTEIN: 7.9 g/dL (ref 6.4–8.3)

## 2015-04-20 MED ORDER — SODIUM CHLORIDE 0.9% FLUSH
10.0000 mL | INTRAVENOUS | Status: DC | PRN
  Administered 2015-04-20: 10 mL
  Filled 2015-04-20: qty 10

## 2015-04-20 MED ORDER — SODIUM CHLORIDE 0.9 % IV SOLN
375.0000 mg/m2 | Freq: Once | INTRAVENOUS | Status: AC
  Administered 2015-04-20: 720 mg via INTRAVENOUS
  Filled 2015-04-20: qty 36

## 2015-04-20 MED ORDER — SODIUM CHLORIDE 0.9 % IV SOLN
Freq: Once | INTRAVENOUS | Status: AC
  Administered 2015-04-20: 12:00:00 via INTRAVENOUS
  Filled 2015-04-20: qty 5

## 2015-04-20 MED ORDER — PALONOSETRON HCL INJECTION 0.25 MG/5ML
INTRAVENOUS | Status: AC
  Filled 2015-04-20: qty 5

## 2015-04-20 MED ORDER — HEPARIN SOD (PORK) LOCK FLUSH 100 UNIT/ML IV SOLN
500.0000 [IU] | Freq: Once | INTRAVENOUS | Status: AC | PRN
  Administered 2015-04-20: 500 [IU]
  Filled 2015-04-20: qty 5

## 2015-04-20 MED ORDER — SODIUM CHLORIDE 0.9 % IV SOLN
Freq: Once | INTRAVENOUS | Status: AC
  Administered 2015-04-20: 12:00:00 via INTRAVENOUS

## 2015-04-20 MED ORDER — PALONOSETRON HCL INJECTION 0.25 MG/5ML
0.2500 mg | Freq: Once | INTRAVENOUS | Status: AC
  Administered 2015-04-20: 0.25 mg via INTRAVENOUS

## 2015-04-20 MED ORDER — SODIUM CHLORIDE 0.9 % IV SOLN
10.0000 [IU]/m2 | Freq: Once | INTRAVENOUS | Status: AC
  Administered 2015-04-20: 19 [IU] via INTRAVENOUS
  Filled 2015-04-20: qty 6.33

## 2015-04-20 MED ORDER — DOXORUBICIN HCL CHEMO IV INJECTION 2 MG/ML
25.0000 mg/m2 | Freq: Once | INTRAVENOUS | Status: AC
  Administered 2015-04-20: 48 mg via INTRAVENOUS
  Filled 2015-04-20: qty 24

## 2015-04-20 MED ORDER — VINBLASTINE SULFATE CHEMO INJECTION 1 MG/ML
5.7000 mg/m2 | Freq: Once | INTRAVENOUS | Status: AC
  Administered 2015-04-20: 11 mg via INTRAVENOUS
  Filled 2015-04-20: qty 11

## 2015-04-20 NOTE — Telephone Encounter (Signed)
left msg for added on labs 4/25 & 5/9

## 2015-04-20 NOTE — Progress Notes (Signed)
Oncology Nurse Navigator Documentation  Oncology Nurse Navigator Flowsheets 04/20/2015  Navigator Location [facility]  Navigator Encounter Type Clinic/[facility]  Patient Visit Type MedOnc  Treatment Phase First Chemo Tx  Barriers/Navigation Needs Education  Education Other  Interventions Education Method  Education Method Verbal  Acuity Level 1  Time Spent with Patient 15   Spoke to patient today at [facility].  Jeffery Crane is starting his first cycle of chemo today.  He did stated he wanted to go to the beach this Friday.  Dr. Julien Nordmann is aware and I reiterated the importance of keeping himself hydrated at the beach and to call if he is not feeling well at [phone].

## 2015-04-20 NOTE — Progress Notes (Signed)
[place] [phone]   [phone]  OFFICE PROGRESS NOTE  [facility] [place] [address]  DIAGNOSIS: Stage III nodular sclerosing Hodgkin lymphoma presented with cavitary masses in the right lung in addition to mediastinal, supraclavicular, axillary as well as splenic involvement diagnosed in March 2017  PRIOR THERAPY: Status post pericardiocentesis with drainage of pericardial fluid secondary to Hodgkin's lymphoma. Cytology was negative for lymphoma.  CURRENT THERAPY: Systemic chemotherapy with ABVD. First dose 04/20/2015.  INTERVAL HISTORY: Jeffery Crane 20 y.o. male returns to the clinic today for follow-up visit accompanied by his girlfriend [place]. The patient has no complaints today. He underwent a bone marrow biopsy and aspirate as well as Port-A-Cath placement last week. He is here today to start the first cycle of his treatment with ABVD. He continues to have increasing fatigue and weakness as well as shortness breath with exertion. He has no chest pain but continues to have dry cough with no hemoptysis. He denied having any significant fever or chills. He denied having any nausea or vomiting.  MEDICAL HISTORY: Past Medical History  Diagnosis Date  . Adult ADHD   . Pneumonia   . Headache   . GERD (gastroesophageal reflux disease)   . 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  MEDICATIONS:  Current Outpatient Prescriptions  Medication Sig Dispense Refill  . allopurinol (ZYLOPRIM) 100 MG tablet Take 1 tablet (100 mg total) by mouth 2 (two) times daily. 60 tablet 3  . HYDROcodone-homatropine (HYCODAN) 5-1.5 MG/5ML syrup Take 5 mLs by mouth every 6 (six) hours as needed for cough. 120 mL 0  . lidocaine-prilocaine (EMLA) cream Apply 1 application topically as needed. 30 g 0  .  oxyCODONE (OXY IR/ROXICODONE) 5 MG immediate release tablet Take 1-2 tablets (5-10 mg total) by mouth every 6 (six) hours as needed (pain). 30 tablet 0  . prochlorperazine (COMPAZINE) 10 MG tablet Take 1 tablet (10 mg total) by mouth every 6 (six) hours as needed for nausea or vomiting. 30 tablet 0   No current facility-administered medications for this visit.    SURGICAL HISTORY:  Past Surgical History  Procedure Laterality Date  . Video bronchoscopy Bilateral 01/22/2015    Procedure: VIDEO BRONCHOSCOPY WITH FLUORO;  Surgeon: Marshell Garfinkel, MD;  Location: [facility];  Service: Cardiopulmonary;  Laterality: Bilateral;  . Supraclavical node biopsy Right 03/15/2015    Procedure: RIGHT SUPRACLAVICAL/CERVICAL LYMPH NODE BIOPSY;  Surgeon: [name], MD;  Location: [facility];  Service: Thoracic;  Laterality: Right;  . [place]ene node biopsy Right 03/15/2015    Procedure: BIOPSY SCALENE NODE;  Surgeon: [name], MD;  Location: [facility];  Service: Thoracic;  Laterality: Right;  . Cardiac catheterization N/A 04/01/2015    Procedure: Pericardiocentesis;  Surgeon: Sherren Mocha, MD;  Location: [facility];  Service: Cardiovascular;  Laterality: N/A;    REVIEW OF SYSTEMS:  Constitutional: positive for anorexia, fatigue and weight loss Eyes: negative Ears, nose, mouth, throat, and face: negative Respiratory: positive for cough and dyspnea on exertion Cardiovascular: negative Gastrointestinal: negative Genitourinary:negative Integument/breast: negative Hematologic/lymphatic: positive for lymphadenopathy Musculoskeletal:negative Neurological: negative Behavioral/Psych: negative Endocrine: negative Allergic/Immunologic: negative   PHYSICAL EXAMINATION: General appearance: alert, cooperative, fatigued and no distress Head: Normocephalic, without obvious abnormality, atraumatic Neck: marked anterior cervical adenopathy and thyroid not enlarged, symmetric, no  tenderness/mass/nodules Lymph nodes:  Large right cervical and supraclavicular as well as a right axillary lymphadenopathy Resp: wheezes RUL Back: symmetric, no curvature. ROM normal. No CVA tenderness. Cardio: regular rate and rhythm, S1, S2 normal, no murmur, click, rub or gallop GI: soft, non-tender; bowel sounds normal; no masses,  no organomegaly Extremities: extremities normal, atraumatic, no cyanosis or edema Neurologic: Alert and oriented X 3, normal strength and tone. Normal symmetric reflexes. Normal coordination and gait  ECOG PERFORMANCE STATUS: 1 - Symptomatic but completely ambulatory  Blood pressure 119/74, pulse 115, temperature 98.8 F (37.1 C), temperature source Oral, resp. rate 20, height '5\' 10"'  (1.778 m), weight 159 lb 4.8 oz (72.258 kg), SpO2 100 %.  LABORATORY DATA: Lab Results  Component Value Date   WBC 22.6* 04/20/2015   HGB 7.9* 04/20/2015   HCT 25.9* 04/20/2015   MCV 75.4* 04/20/2015   PLT 543* 04/20/2015      Chemistry      Component Value Date/Time   NA 140 04/06/2015 1236   NA 138 04/01/2015 0345   NA 138 12/18/2013 0848   K 3.9 04/06/2015 1236   K 3.6 04/01/2015 0345   K 4.0 12/18/2013 0848   CL 103 04/01/2015 0345   CL 105 12/18/2013 0848   CO2 26 04/06/2015 1236   CO2 26 04/01/2015 0345   CO2 28 12/18/2013 0848   BUN 7.0 04/06/2015 1236   BUN <5* 04/01/2015 0345   BUN 7 12/18/2013 0848   CREATININE 0.7 04/06/2015 1236   CREATININE 0.59* 04/01/2015 0345   CREATININE 0.93 12/18/2013 0848      Component Value Date/Time   CALCIUM 9.3 04/06/2015 1236   CALCIUM 8.4* 04/01/2015 0345   CALCIUM 9.0 12/18/2013 0848   ALKPHOS 210* 04/06/2015 1236   ALKPHOS 186* 04/01/2015 0345   AST 61* 04/06/2015 1236   AST 29 04/01/2015 0345   ALT 50 04/06/2015 1236   ALT 26 04/01/2015 0345   BILITOT 0.39 04/06/2015 1236   BILITOT 0.3 04/01/2015 0345       RADIOGRAPHIC STUDIES: Nm Pet Image Initial (pi) Skull Base To Thigh  03/31/2015  CLINICAL  DATA:  Initial treatment strategy for Hodgkin's lymphoma. EXAM: NUCLEAR MEDICINE PET SKULL BASE TO THIGH TECHNIQUE: 8.3 mCi F-18 FDG was injected intravenously. Full-ring PET imaging was performed from the skull base to thigh after the radiotracer. CT data was obtained and used for attenuation correction and anatomic localization. FASTING BLOOD GLUCOSE:  Value: 99 mg/dl COMPARISON:  CT 07/29/2015, 01/19/2015 FINDINGS: NECK Enlarged bulky hypermetabolic bladder supraclavicular nodes with SUV max equal 17. CHEST Enlarged RIGHT axillary lymph nodes. Individual lymph nodes measure 2 cm short axis with SUV max 18.1. The RIGHT upper lobe cavitary mass is again demonstrated measuring approximately 9.6 x 4.5 cm. The mass is intensely hypermetabolic with SUV max 17.0. Hyper meta paratracheal, subcarinal and bilateral hilar lymph nodes which are equally intense. There is a new large pericardial effusion. The effusion measures 39 mm in depth adjacent to the LEFT ventricle which is increased from 1-2 mm on comparison exam CT 03/01/2015. Effusion anterior to the RIGHT ventricle measures 11 mm slightly increased from 9 mm on prior. ABDOMEN/PELVIS Although spleen is not enlarged, it is relative increased in hypermetabolic activity compared to the liver with SUV max equal 4.2 compared to 2.8. No hypermetabolic lymph nodes in the abdomen or pelvis. SKELETON There is diffuse intensely hypermetabolic bone marrow activity throughout the and higher axillary and appendicular skeleton. For example in the posterior LEFT iliac bone SUV max equals  7.8. IMPRESSION: 1. Hypermetabolic supraclavicular, RIGHT axillary and mediastinal adenopathy consistent lymphoma. 2. Intensely hypermetabolic RIGHT upper lobe cavitary mass consistent with lymphoma. 3. Hypermetabolic but normal size spleen is concerning for lymphoma involvement. 4. Intense metabolic activity throughout the entire marrow space is consistent with lymphoma involvement of the  skeleton. 5. Large pericardial effusion increased from CT 2 days prior. The pericardial effusion was discussed with Dr. Julien Nordmann [facility] with recommendation for cardiology / cardiothoracic surgery consultation. 03/31/2015 at09:46. Electronically Signed   By: Suzy Bouchard M.D.   On: 03/31/2015 09:50   Ir Fluoro Guide Cv Line Right  04/16/2015  CLINICAL DATA:  NODULAR SCLEROSING HODGKIN'S LYMPHOMA EXAM: RIGHT INTERNAL JUGULAR SINGLE LUMEN POWER PORT CATHETER INSERTION Date:  4/14/20174/14/2017 10:33 am Radiologist:  M. Daryll Brod, MD Guidance:  Ultrasound and fluoroscopic MEDICATIONS: Vancomycin 1 gm IV; The antibiotic was administered within an appropriate time interval prior to skin puncture. ANESTHESIA/SEDATION: Versed 3.5 mg IV; Fentanyl 100 mcg IV; Moderate Sedation Time:  31 The patient was continuously monitored during the procedure by the interventional radiology nurse under my direct supervision. FLUOROSCOPY TIME:  42 seconds (7 mGy) COMPLICATIONS: None immediate. CONTRAST:  None. PROCEDURE: Informed consent was obtained from the patient following explanation of the procedure, risks, benefits and alternatives. The patient understands, agrees and consents for the procedure. All questions were addressed. A time out was performed. Maximal barrier sterile technique utilized including caps, mask, sterile gowns, sterile gloves, large sterile drape, hand hygiene, and 2% chlorhexidine scrub. Under sterile conditions and local anesthesia, right internal jugular micropuncture venous access was performed. Access was performed with ultrasound. Images were obtained for documentation. A guide wire was inserted followed by a transitional dilator. This allowed insertion of a guide wire and catheter into the IVC. Measurements were obtained from the SVC / RA junction back to the right IJ venotomy site. In the right infraclavicular chest, a subcutaneous pocket was created over the second anterior rib. This was done under  sterile conditions and local anesthesia. 1% lidocaine with epinephrine was utilized for this. A 2.5 cm incision was made in the skin. Blunt dissection was performed to create a subcutaneous pocket over the right pectoralis major muscle. The pocket was flushed with saline vigorously. There was adequate hemostasis. The port catheter was assembled and checked for leakage. The port catheter was secured in the pocket with two retention sutures. The tubing was tunneled subcutaneously to the right venotomy site and inserted into the SVC/RA junction through a valved peel-away sheath. Position was confirmed with fluoroscopy. Images were obtained for documentation. The patient tolerated the procedure well. No immediate complications. Incisions were closed in a two layer fashion with 4 - 0 Vicryl suture. Dermabond was applied to the skin. The port catheter was accessed, blood was aspirated followed by saline and heparin flushes. Needle was removed. A dry sterile dressing was applied. IMPRESSION: Ultrasound and fluoroscopically guided right internal jugular single lumen power port catheter insertion. Tip in the SVC/RA junction. Catheter ready for use. Electronically Signed   By: Jerilynn Mages.  Shick M.D.   On: 04/16/2015 10:52   Ir US Guide Vasc Access Right  04/16/2015  CLINICAL DATA:  NODULAR SCLEROSING HODGKIN'S LYMPHOMA EXAM: RIGHT INTERNAL JUGULAR SINGLE LUMEN POWER PORT CATHETER INSERTION Date:  4/14/20174/14/2017 10:33 am Radiologist:  M. Daryll Brod, MD Guidance:  Ultrasound and fluoroscopic MEDICATIONS: Vancomycin 1 gm IV; The antibiotic was administered within an appropriate time interval prior to skin puncture. ANESTHESIA/SEDATION: Versed 3.5 mg IV; Fentanyl 100 mcg IV; Moderate  Sedation Time:  31 The patient was continuously monitored during the procedure by the interventional radiology nurse under my direct supervision. FLUOROSCOPY TIME:  42 seconds (7 mGy) COMPLICATIONS: None immediate. CONTRAST:  None. PROCEDURE:  Informed consent was obtained from the patient following explanation of the procedure, risks, benefits and alternatives. The patient understands, agrees and consents for the procedure. All questions were addressed. A time out was performed. Maximal barrier sterile technique utilized including caps, mask, sterile gowns, sterile gloves, large sterile drape, hand hygiene, and 2% chlorhexidine scrub. Under sterile conditions and local anesthesia, right internal jugular micropuncture venous access was performed. Access was performed with ultrasound. Images were obtained for documentation. A guide wire was inserted followed by a transitional dilator. This allowed insertion of a guide wire and catheter into the IVC. Measurements were obtained from the SVC / RA junction back to the right IJ venotomy site. In the right infraclavicular chest, a subcutaneous pocket was created over the second anterior rib. This was done under sterile conditions and local anesthesia. 1% lidocaine with epinephrine was utilized for this. A 2.5 cm incision was made in the skin. Blunt dissection was performed to create a subcutaneous pocket over the right pectoralis major muscle. The pocket was flushed with saline vigorously. There was adequate hemostasis. The port catheter was assembled and checked for leakage. The port catheter was secured in the pocket with two retention sutures. The tubing was tunneled subcutaneously to the right venotomy site and inserted into the SVC/RA junction through a valved peel-away sheath. Position was confirmed with fluoroscopy. Images were obtained for documentation. The patient tolerated the procedure well. No immediate complications. Incisions were closed in a two layer fashion with 4 - 0 Vicryl suture. Dermabond was applied to the skin. The port catheter was accessed, blood was aspirated followed by saline and heparin flushes. Needle was removed. A dry sterile dressing was applied. IMPRESSION: Ultrasound and  fluoroscopically guided right internal jugular single lumen power port catheter insertion. Tip in the SVC/RA junction. Catheter ready for use. Electronically Signed   By: Jerilynn Mages.  Shick M.D.   On: 04/16/2015 10:52   Ct Biopsy  04/16/2015  INDICATION: Nodular sclerosing Hodgkin's lymphoma EXAM: CT GUIDED RIGHT ILIAC BONE MARROW ASPIRATION AND CORE BIOPSY Date:  4/14/20174/14/2017 9:56 am Radiologist:  M. Daryll Brod, MD Guidance:  CT FLUOROSCOPY TIME:  Fluoroscopy Time: NONE. MEDICATIONS: 1% lidocaine locally ANESTHESIA/SEDATION: 2.5 mg IV Versed; 100 mcg IV Fentanyl Moderate Sedation Time:  10 The patient was continuously monitored during the procedure by the interventional radiology nurse under my direct supervision. CONTRAST:  None. COMPLICATIONS: None PROCEDURE: Informed consent was obtained from the patient following explanation of the procedure, risks, benefits and alternatives. The patient understands, agrees and consents for the procedure. All questions were addressed. A time out was performed. The patient was positioned prone and non-contrast localization CT was performed of the pelvis to demonstrate the iliac marrow spaces. Maximal barrier sterile technique utilized including caps, mask, sterile gowns, sterile gloves, large sterile drape, hand hygiene, and Betadine prep. Under sterile conditions and local anesthesia, an 11 gauge coaxial bone biopsy needle was advanced into the right iliac marrow space. Needle position was confirmed with CT imaging. Initially, bone marrow aspiration was performed. Next, the 11 gauge outer cannula was utilized to obtain a right iliac bone marrow core biopsy. Needle was removed. Hemostasis was obtained with compression. The patient tolerated the procedure well. Samples were prepared with the cytotechnologist. No immediate complications. IMPRESSION: CT guided right iliac bone  marrow aspiration and core biopsy. Electronically Signed   By: Jerilynn Mages.  Shick M.D.   On: 04/16/2015 10:53     ASSESSMENT AND PLAN: This is a very pleasant 20 years old African-American male recently diagnosed with probably stage III nodular sclerosing Hodgkin lymphoma with bulky lymphadenopathy in the right lower cervical, right supraclavicular, right axillary as well as mediastinal lymph nodes in addition to cavitary lung masses and splenic involvement diagnosed in March 2017. The bone marrow biopsy and aspirate performed recently did not show evidence of involvement with the Hodgkin's lymphoma. For IV access, the patient had a Port-A-Cath placed last week. I recommended for the patient to proceed with his treatment with ABVD today as a scheduled. He would come back for follow-up visit in 2 weeks for reevaluation and management of any adverse effect of his treatment. He was advised to call immediately if he has any concerning symptoms in the interval. The patient voices understanding of current disease status and treatment options and is in agreement with the current care plan.  All questions were answered. The patient knows to call the clinic with any problems, questions or concerns. We can certainly see the patient much sooner if necessary.  I spent 15 minutes counseling the patient face to face. The total time spent in the appointment was 25 minutes.  Disclaimer: This note was dictated with voice recognition software. Similar sounding words can inadvertently be transcribed and may not be corrected upon review.

## 2015-04-20 NOTE — Telephone Encounter (Signed)
Gave pt appt & avs °

## 2015-04-20 NOTE — Patient Instructions (Addendum)
PICK UP EMLA CREAM FROM [facility] - PUT ON SO IT IS ON FOR 1.5 TO 2 HOURS PRIOR TO NEEDING TO BE USED. Doxorubicin injection What is this medicine? DOXORUBICIN (dox oh ROO bi sin) is a chemotherapy drug. It is used to treat many kinds of cancer like Hodgkin's disease, leukemia, non-Hodgkin's lymphoma, neuroblastoma, sarcoma, and Wilms' tumor. It is also used to treat bladder cancer, breast cancer, lung cancer, ovarian cancer, stomach cancer, and thyroid cancer. This medicine may be used for other purposes; ask your health care provider or pharmacist if you have questions. What should I tell my health care provider before I take this medicine? They need to know if you have any of these conditions: -blood disorders -heart disease, recent heart attack -infection (especially a virus infection such as chickenpox, cold sores, or herpes) -irregular heartbeat -liver disease -recent or ongoing radiation therapy -an unusual or allergic reaction to doxorubicin, other chemotherapy agents, other medicines, foods, dyes, or preservatives -pregnant or trying to get pregnant -breast-feeding How should I use this medicine? This drug is given as an infusion into a vein. It is administered in a hospital or clinic by a specially trained health care professional. If you have pain, swelling, burning or any unusual feeling around the site of your injection, tell your health care professional right away. Talk to your pediatrician regarding the use of this medicine in children. Special care may be needed. Overdosage: If you think you have taken too much of this medicine contact a poison control center or emergency room at once. NOTE: This medicine is only for you. Do not share this medicine with others. What if I miss a dose? It is important not to miss your dose. Call your doctor or health care professional if you are unable to keep an appointment. What may interact with this medicine? Do not take this medicine with any of  the following medications: -cisapride -droperidol -halofantrine -pimozide -zidovudine This medicine may also interact with the following medications: -chloroquine -chlorpromazine -clarithromycin -cyclophosphamide -cyclosporine -erythromycin -medicines for depression, anxiety, or psychotic disturbances -medicines for irregular heart beat like amiodarone, bepridil, dofetilide, encainide, flecainide, propafenone, quinidine -medicines for seizures like ethotoin, fosphenytoin, phenytoin -medicines for nausea, vomiting like dolasetron, ondansetron, palonosetron -medicines to increase blood counts like filgrastim, pegfilgrastim, sargramostim -methadone -methotrexate -pentamidine -progesterone -vaccines -verapamil Talk to your doctor or health care professional before taking any of these medicines: -acetaminophen -aspirin -ibuprofen -ketoprofen -naproxen This list may not describe all possible interactions. Give your health care provider a list of all the medicines, herbs, non-prescription drugs, or dietary supplements you use. Also tell them if you smoke, drink alcohol, or use illegal drugs. Some items may interact with your medicine. What should I watch for while using this medicine? Your condition will be monitored carefully while you are receiving this medicine. You will need important blood work done while you are taking this medicine. This drug may make you feel generally unwell. This is not uncommon, as chemotherapy can affect healthy cells as well as cancer cells. Report any side effects. Continue your course of treatment even though you feel ill unless your doctor tells you to stop. Your urine may turn red for a few days after your dose. This is not blood. If your urine is dark or brown, call your doctor. In some cases, you may be given additional medicines to help with side effects. Follow all directions for their use. Call your doctor or health care professional for advice if  you get a  fever, chills or sore throat, or other symptoms of a cold or flu. Do not treat yourself. This drug decreases your body's ability to fight infections. Try to avoid being around people who are sick. This medicine may increase your risk to bruise or bleed. Call your doctor or health care professional if you notice any unusual bleeding. Be careful brushing and flossing your teeth or using a toothpick because you may get an infection or bleed more easily. If you have any dental work done, tell your dentist you are receiving this medicine. Avoid taking products that contain aspirin, acetaminophen, ibuprofen, naproxen, or ketoprofen unless instructed by your doctor. These medicines may hide a fever. Men and women of childbearing age should use effective birth control methods while using taking this medicine. Do not become pregnant while taking this medicine. There is a potential for serious side effects to an unborn child. Talk to your health care professional or pharmacist for more information. Do not breast-feed an infant while taking this medicine. Do not let others touch your urine or other body fluids for 5 days after each treatment with this medicine. Caregivers should wear latex gloves to avoid touching body fluids during this time. There is a maximum amount of this medicine you should receive throughout your life. The amount depends on the medical condition being treated and your overall health. Your doctor will watch how much of this medicine you receive in your lifetime. Tell your doctor if you have taken this medicine before. What side effects may I notice from receiving this medicine? Side effects that you should report to your doctor or health care professional as soon as possible: -allergic reactions like skin rash, itching or hives, swelling of the face, lips, or tongue -low blood counts - this medicine may decrease the number of white blood cells, red blood cells and platelets. You may  be at increased risk for infections and bleeding. -signs of infection - fever or chills, cough, sore throat, pain or difficulty passing urine -signs of decreased platelets or bleeding - bruising, pinpoint red spots on the skin, black, tarry stools, blood in the urine -signs of decreased red blood cells - unusually weak or tired, fainting spells, lightheadedness -breathing problems -chest pain -fast, irregular heartbeat -mouth sores -nausea, vomiting -pain, swelling, redness at site where injected -pain, tingling, numbness in the hands or feet -swelling of ankles, feet, or hands -unusual bleeding or bruising Side effects that usually do not require medical attention (report to your doctor or health care professional if they continue or are bothersome): -diarrhea -facial flushing -hair loss -loss of appetite -missed menstrual periods -nail discoloration or damage -red or watery eyes -red colored urine -stomach upset This list may not describe all possible side effects. Call your doctor for medical advice about side effects. You may report side effects to FDA at 1-800-FDA-1088. Where should I keep my medicine? This drug is given in a hospital or clinic and will not be stored at home. NOTE: This sheet is a summary. It may not cover all possible information. If you have questions about this medicine, talk to your doctor, pharmacist, or health care provider.    2016, [employer]. (2012-04-16 09:54:34)  Vinblastine injection What is this medicine? VINBLASTINE (vin BLAS teen) is a chemotherapy drug. It slows the growth of cancer cells. This medicine is used to treat many types of cancer like breast cancer, testicular cancer, Hodgkin's disease, non-Hodgkin's lymphoma, and sarcoma. This medicine may be used for other purposes; ask  your health care provider or pharmacist if you have questions. What should I tell my health care provider before I take this medicine? They need to  know if you have any of these conditions: -blood disorders -dental disease -gout -infection (especially a virus infection such as chickenpox, cold sores, or herpes) -liver disease -lung disease -nervous system disease -recent or ongoing radiation therapy -an unusual or allergic reaction to vinblastine, other chemotherapy agents, other medicines, foods, dyes, or preservatives -pregnant or trying to get pregnant -breast-feeding How should I use this medicine? This drug is given as an infusion into a vein. It is administered in a hospital or clinic by a specially trained health care professional. If you have pain, swelling, burning or any unusual feeling around the site of your injection, tell your health care professional right away. Talk to your pediatrician regarding the use of this medicine in children. While this drug may be prescribed for selected conditions, precautions do apply. Overdosage: If you think you have taken too much of this medicine contact a poison control center or emergency room at once. NOTE: This medicine is only for you. Do not share this medicine with others. What if I miss a dose? It is important not to miss your dose. Call your doctor or health care professional if you are unable to keep an appointment. What may interact with this medicine? Do not take this medicine with any of the following medications: -erythromycin -itraconazole -mibefradil -voriconazole This medicine may also interact with the following medications: -cyclosporine -fluconazole -ketoconazole -medicines for seizures like phenytoin -medicines to increase blood counts like filgrastim, pegfilgrastim, sargramostim -vaccines -verapamil Talk to your doctor or health care professional before taking any of these medicines: -acetaminophen -aspirin -ibuprofen -ketoprofen -naproxen This list may not describe all possible interactions. Give your health care provider a list of all the medicines,  herbs, non-prescription drugs, or dietary supplements you use. Also tell them if you smoke, drink alcohol, or use illegal drugs. Some items may interact with your medicine. What should I watch for while using this medicine? Your condition will be monitored carefully while you are receiving this medicine. You will need important blood work done while you are taking this medicine. This drug may make you feel generally unwell. This is not uncommon, as chemotherapy can affect healthy cells as well as cancer cells. Report any side effects. Continue your course of treatment even though you feel ill unless your doctor tells you to stop. In some cases, you may be given additional medicines to help with side effects. Follow all directions for their use. Call your doctor or health care professional for advice if you get a fever, chills or sore throat, or other symptoms of a cold or flu. Do not treat yourself. This drug decreases your body's ability to fight infections. Try to avoid being around people who are sick. This medicine may increase your risk to bruise or bleed. Call your doctor or health care professional if you notice any unusual bleeding. Be careful brushing and flossing your teeth or using a toothpick because you may get an infection or bleed more easily. If you have any dental work done, tell your dentist you are receiving this medicine. Avoid taking products that contain aspirin, acetaminophen, ibuprofen, naproxen, or ketoprofen unless instructed by your doctor. These medicines may hide a fever. Do not become pregnant while taking this medicine. Women should inform their doctor if they wish to become pregnant or think they might be pregnant. There is a  potential for serious side effects to an unborn child. Talk to your health care professional or pharmacist for more information. Do not breast-feed an infant while taking this medicine. Men may have a lower sperm count while taking this medicine. Talk  to your doctor if you plan to father a child. What side effects may I notice from receiving this medicine? Side effects that you should report to your doctor or health care professional as soon as possible: -allergic reactions like skin rash, itching or hives, swelling of the face, lips, or tongue -low blood counts - This drug may decrease the number of white blood cells, red blood cells and platelets. You may be at increased risk for infections and bleeding. -signs of infection - fever or chills, cough, sore throat, pain or difficulty passing urine -signs of decreased platelets or bleeding - bruising, pinpoint red spots on the skin, black, tarry stools, nosebleeds -signs of decreased red blood cells - unusually weak or tired, fainting spells, lightheadedness -breathing problems -changes in hearing -change in the amount of urine -chest pain -high blood pressure -mouth sores -nausea and vomiting -pain, swelling, redness or irritation at the injection site -pain, tingling, numbness in the hands or feet -problems with balance, dizziness -seizures Side effects that usually do not require medical attention (report to your doctor or health care professional if they continue or are bothersome): -constipation -hair loss -jaw pain -loss of appetite -sensitivity to light -stomach pain -tumor pain This list may not describe all possible side effects. Call your doctor for medical advice about side effects. You may report side effects to FDA at 1-800-FDA-1088. Where should I keep my medicine? This drug is given in a hospital or clinic and will not be stored at home. NOTE: This sheet is a summary. It may not cover all possible information. If you have questions about this medicine, talk to your doctor, pharmacist, or health care provider.    2016, [employer] Standard. (2007-09-16 17:15:59)   Dacarbazine, DTIC injection What is this medicine? DACARBAZINE (da KAR ba zeen) is a chemotherapy  drug. This medicine is used to treat skin cancer. It is also used with other medicines to treat Hodgkin's disease. This medicine may be used for other purposes; ask your health care provider or pharmacist if you have questions. What should I tell my health care provider before I take this medicine? They need to know if you have any of these conditions: -infection (especially virus infection such as chickenpox, cold sores, or herpes) -kidney disease -liver disease -low blood counts like low platelets, red blood cells, white blood cells -recent radiation therapy -an unusual or allergic reaction to dacarbazine, other chemotherapy agents, other medicines, foods, dyes, or preservatives -pregnant or trying to get pregnant -breast-feeding How should I use this medicine? This drug is given as an injection or infusion into a vein. It is administered in a hospital or clinic by a specially trained health care professional. Talk to your pediatrician regarding the use of this medicine in children. While this drug may be prescribed for selected conditions, precautions do apply. Overdosage: If you think you have taken too much of this medicine contact a poison control center or emergency room at once. NOTE: This medicine is only for you. Do not share this medicine with others. What if I miss a dose? It is important not to miss your dose. Call your doctor or health care professional if you are unable to keep an appointment. What may interact with this medicine? -medicines  to increase blood counts like filgrastim, pegfilgrastim, sargramostim -vaccines This list may not describe all possible interactions. Give your health care provider a list of all the medicines, herbs, non-prescription drugs, or dietary supplements you use. Also tell them if you smoke, drink alcohol, or use illegal drugs. Some items may interact with your medicine. What should I watch for while using this medicine? Your condition will be  monitored carefully while you are receiving this medicine. You will need important blood work done while you are taking this medicine. This drug may make you feel generally unwell. This is not uncommon, as chemotherapy can affect healthy cells as well as cancer cells. Report any side effects. Continue your course of treatment even though you feel ill unless your doctor tells you to stop. Call your doctor or health care professional for advice if you get a fever, chills or sore throat, or other symptoms of a cold or flu. Do not treat yourself. This drug decreases your body's ability to fight infections. Try to avoid being around people who are sick. This medicine may increase your risk to bruise or bleed. Call your doctor or health care professional if you notice any unusual bleeding. Be careful brushing and flossing your teeth or using a toothpick because you may get an infection or bleed more easily. If you have any dental work done, tell your dentist you are receiving this medicine. Avoid taking products that contain aspirin, acetaminophen, ibuprofen, naproxen, or ketoprofen unless instructed by your doctor. These medicines may hide a fever. Do not become pregnant while taking this medicine. Women should inform their doctor if they wish to become pregnant or think they might be pregnant. There is a potential for serious side effects to an unborn child. Talk to your health care professional or pharmacist for more information. Do not breast-feed an infant while taking this medicine. What side effects may I notice from receiving this medicine? Side effects that you should report to your doctor or health care professional as soon as possible: -allergic reactions like skin rash, itching or hives, swelling of the face, lips, or tongue -low blood counts - this medicine may decrease the number of white blood cells, red blood cells and platelets. You may be at increased risk for infections and bleeding. -signs  of infection - fever or chills, cough, sore throat, pain or difficulty passing urine -signs of decreased platelets or bleeding - bruising, pinpoint red spots on the skin, black, tarry stools, blood in the urine -signs of decreased red blood cells - unusually weak or tired, fainting spells, lightheadedness -breathing problems -muscle pains -pain at site where injected -trouble passing urine or change in the amount of urine -vomiting -yellowing of the eyes or skin Side effects that usually do not require medical attention (report to your doctor or health care professional if they continue or are bothersome): -diarrhea -hair loss -loss of appetite -nausea -skin more sensitive to sun or ultraviolet light -stomach upset This list may not describe all possible side effects. Call your doctor for medical advice about side effects. You may report side effects to FDA at 1-800-FDA-1088. Where should I keep my medicine? This drug is given in a hospital or clinic and will not be stored at home. NOTE: This sheet is a summary. It may not cover all possible information. If you have questions about this medicine, talk to your doctor, pharmacist, or health care provider.    2016, [employer] Standard. (2007-04-09 16:56:39) Bleomycin injection What is this medicine?  BLEOMYCIN (blee oh MYE sin) is a chemotherapy drug. It is used to treat many kinds of cancer like lymphoma, cervical cancer, head and neck cancer, and testicular cancer. It is also used to prevent and to treat fluid build-up around the lungs caused by some cancers. This medicine may be used for other purposes; ask your health care provider or pharmacist if you have questions. What should I tell my health care provider before I take this medicine? They need to know if you have any of these conditions: -cigarette smoker -kidney disease -lung disease -recent or ongoing radiation therapy -an unusual or allergic reaction to bleomycin, other  chemotherapy agents, other medicines, foods, dyes, or preservatives -pregnant or trying to get pregnant -breast-feeding How should I use this medicine? This drug is given as an infusion into a vein or a body cavity. It can also be given as an injection into a muscle or under the skin. It is administered in a hospital or clinic by a specially trained health care professional. Talk to your pediatrician regarding the use of this medicine in children. Special care may be needed. Overdosage: If you think you have taken too much of this medicine contact a poison control center or emergency room at once. NOTE: This medicine is only for you. Do not share this medicine with others. What if I miss a dose? It is important not to miss your dose. Call your doctor or health care professional if you are unable to keep an appointment. What may interact with this medicine? -certain antibiotics given by injection -cisplatin -cyclosporine -diuretics -foscarnet -medicines to increase blood counts like filgrastim, pegfilgrastim, sargramostim -vaccines This list may not describe all possible interactions. Give your health care provider a list of all the medicines, herbs, non-prescription drugs, or dietary supplements you use. Also tell them if you smoke, drink alcohol, or use illegal drugs. Some items may interact with your medicine. What should I watch for while using this medicine? Visit your doctor for checks on your progress. This drug may make you feel generally unwell. This is not uncommon, as chemotherapy can affect healthy cells as well as cancer cells. Report any side effects. Continue your course of treatment even though you feel ill unless your doctor tells you to stop. Call your doctor or health care professional for advice if you get a fever, chills or sore throat, or other symptoms of a cold or flu. Do not treat yourself. This drug decreases your body's ability to fight infections. Try to avoid being  around people who are sick. Avoid taking products that contain aspirin, acetaminophen, ibuprofen, naproxen, or ketoprofen unless instructed by your doctor. These medicines may hide a fever. Do not become pregnant while taking this medicine. Women should inform their doctor if they wish to become pregnant or think they might be pregnant. There is a potential for serious side effects to an unborn child. Talk to your health care professional or pharmacist for more information. Do not breast-feed an infant while taking this medicine. There is a maximum amount of this medicine you should receive throughout your life. The amount depends on the medical condition being treated and your overall health. Your doctor will watch how much of this medicine you receive in your lifetime. Tell your doctor if you have taken this medicine before. What side effects may I notice from receiving this medicine? Side effects that you should report to your doctor or health care professional as soon as possible: -allergic reactions like skin rash,  itching or hives, swelling of the face, lips, or tongue -breathing problems -chest pain -confusion -cough -fast, irregular heartbeat -feeling faint or lightheaded, falls -fever or chills -mouth sores -pain, tingling, numbness in the hands or feet -trouble passing urine or change in the amount of urine -yellowing of the eyes or skin Side effects that usually do not require medical attention (report to your doctor or health care professional if they continue or are bothersome): -darker skin color -hair loss -irritation at site where injected -loss of appetite -nail changes -nausea and vomiting -weight loss This list may not describe all possible side effects. Call your doctor for medical advice about side effects. You may report side effects to FDA at 1-800-FDA-1088. Where should I keep my medicine? This drug is given in a hospital or clinic and will not be stored at  home. NOTE: This sheet is a summary. It may not cover all possible information. If you have questions about this medicine, talk to your doctor, pharmacist, or health care provider.    2016, [employer] Standard. (2012-04-16 09:36:48)  [facility] Discharge Instructions for Patients Receiving Chemotherapy  Today you received the following chemotherapy agents Adriamycin, Vinblastine, Bleocin and Dacarbazine. To help prevent nausea and vomiting after your treatment, we encourage you to take your nausea medication as directed.   If you develop nausea and vomiting that is not controlled by your nausea medication, call the clinic.   BELOW ARE SYMPTOMS THAT SHOULD BE REPORTED IMMEDIATELY:  *FEVER GREATER THAN 100.5 F  *CHILLS WITH OR WITHOUT FEVER  NAUSEA AND VOMITING THAT IS NOT CONTROLLED WITH YOUR NAUSEA MEDICATION  *UNUSUAL SHORTNESS OF BREATH  *UNUSUAL BRUISING OR BLEEDING  TENDERNESS IN MOUTH AND THROAT WITH OR WITHOUT PRESENCE OF ULCERS  *URINARY PROBLEMS  *BOWEL PROBLEMS  UNUSUAL RASH Items with * indicate a potential emergency and should be followed up as soon as possible.  Feel free to call the clinic you have any questions or concerns. The clinic phone number is [phone].  Please show the [facility] at check-in to the Emergency Department and triage nurse.

## 2015-04-20 NOTE — Progress Notes (Signed)
Ok to treat today with Hgb value and HR of 115 today - pt reports being nervous- per MD Julien Nordmann

## 2015-04-21 ENCOUNTER — Telehealth: Payer: Self-pay

## 2015-04-21 NOTE — Telephone Encounter (Signed)
-----   Message from Herschell Dimes, RN sent at 04/20/2015  5:11 PM EDT ----- Regarding: Jeffery Crane - first time follow up Contact: [phone] Patient of Dr. Julien Crane had first time Adriamycin, Bleocin, Vinblastine and Dtic today; he tolerated it well aside from a lot of anxiety. Has a new PAC that seemed to generated a lot of that anxiety but it is healing very well.

## 2015-04-21 NOTE — Telephone Encounter (Signed)
[facility] for chemotherapy F/U.  Patient is doing well.  Patient complains of nausea, no vomiting.  Denies any new side effects or symptoms.  Bowel and bladder is functioning well.  Eating and drinking well and I instructed to drink 64 oz minimum daily or at least the day before, of and after treatment.  Denies questions at this time and encouraged to call if needed.  Reviewed how to call after hours in the case of an emergency.

## 2015-04-23 ENCOUNTER — Other Ambulatory Visit (HOSPITAL_COMMUNITY): Payer: Self-pay

## 2015-04-23 ENCOUNTER — Ambulatory Visit (HOSPITAL_COMMUNITY): Payer: Self-pay | Attending: Cardiology

## 2015-04-23 ENCOUNTER — Encounter (HOSPITAL_COMMUNITY): Payer: Self-pay

## 2015-04-23 ENCOUNTER — Other Ambulatory Visit: Payer: Self-pay

## 2015-04-23 ENCOUNTER — Ambulatory Visit (INDEPENDENT_AMBULATORY_CARE_PROVIDER_SITE_OTHER): Payer: Self-pay | Admitting: Cardiology

## 2015-04-23 ENCOUNTER — Encounter: Payer: Self-pay | Admitting: Cardiology

## 2015-04-23 VITALS — BP 115/62 | HR 96

## 2015-04-23 DIAGNOSIS — I319 Disease of pericardium, unspecified: Secondary | ICD-10-CM

## 2015-04-23 DIAGNOSIS — C8599 Non-Hodgkin lymphoma, unspecified, extranodal and solid organ sites: Secondary | ICD-10-CM

## 2015-04-23 DIAGNOSIS — I313 Pericardial effusion (noninflammatory): Secondary | ICD-10-CM

## 2015-04-23 DIAGNOSIS — I3139 Other pericardial effusion (noninflammatory): Secondary | ICD-10-CM

## 2015-04-23 DIAGNOSIS — I314 Cardiac tamponade: Secondary | ICD-10-CM

## 2015-04-23 DIAGNOSIS — C8592 Non-Hodgkin lymphoma, unspecified, intrathoracic lymph nodes: Secondary | ICD-10-CM

## 2015-04-23 NOTE — Progress Notes (Signed)
Patient ID: Jeffery Crane, male   DOB: Aug 04, 1995, 20 y.o.   MRN: [id number]      Cardiology Office Note:    Date:  04/23/2015   ID:  Jeffery Crane, DOB 05-Jan-1995, MRN [id number]  PCP:  [facility]  Cardiologist:  Dr. Daneen Schick   Electrophysiologist:  N/a Oncologist: Dr. Julien Nordmann  Referring MD: [facility]   Chief complain: The patient sent from the echo lab as an urgent visit for significant pericardial effusion.   History of Present Illness:     Jeffery Crane is a 20 y.o. male with a hx of newly dx Hodgkin's Lymphoma with cavitary RUL lesion and hilar and mediastinal adenopathy.  He was recently noted to have a large pericardial effusion on Echo.  This was initially followed clinically.  However, PET scan showed increasing size of his effusion and he was admitted 3/29-4/1. He underwent pericardiocentesis 3/30.  Procedure was complicated by subcostal or mammary artery injury with significant bleeding after pericardial drain was removed.  This resolved with manual compression.  Echo prior to DC demonstrated small mod recurrent effusion.   Repeat echo in several days was recommended as well as 1 week of prophylactic antibiotics with Doxycycline.  Repeat  Echocardiogram on April 14 showed mild to moderate pericardial effusion with no signs of tamponade and repeat echocardiogram was performed again today 04/23/2015. Today's echo shows severe circumferential pericardial effusion with largest diameter 35 mm and signs of cardiac tamponade and therefore patient was sent to me urgently seen in the clinic.  He comes accompanied by his mom, his vitals are stable blood pressure 115/64, heart rate 96. He has just underwent his first chemotherapy and feels exhausted and depressed. He denies any dizziness other than after taking a shower, he continues having good appetite and good oral intake. He denies any palpitations or syncope. No chest pain. He only complains of  pain under his scapulas in the back.  Past Medical History  Diagnosis Date  . Adult ADHD   . Pneumonia   . Headache   . GERD (gastroesophageal reflux disease)   . 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 Video bronchoscopy Bilateral 01/22/2015    Procedure: VIDEO BRONCHOSCOPY WITH FLUORO;  Surgeon: Marshell Garfinkel, MD;  Location: [facility];  Service: Cardiopulmonary;  Laterality: Bilateral;  . Supraclavical node biopsy Right 03/15/2015    Procedure: RIGHT SUPRACLAVICAL/CERVICAL LYMPH NODE BIOPSY;  Surgeon: [name], MD;  Location: [facility];  Service: Thoracic;  Laterality: Right;  . Scalene node biopsy Right 03/15/2015    Procedure: BIOPSY SCALENE NODE;  Surgeon: [name], MD;  Location: [facility];  Service: Thoracic;  Laterality: Right;  . Cardiac catheterization N/A 04/01/2015    Procedure: Pericardiocentesis;  Surgeon: Sherren Mocha, MD;  Location: [facility];  Service: Cardiovascular;  Laterality: N/A;   Current Medications: Outpatient Prescriptions Prior to Visit  Medication Sig Dispense Refill  . allopurinol (ZYLOPRIM) 100 MG tablet Take 1 tablet (100 mg total) by mouth 2 (two) times daily. 60 tablet 3  . HYDROcodone-homatropine (HYCODAN) 5-1.5 MG/5ML syrup Take 5 mLs by mouth every 6 (six) hours as needed for cough. 120 mL 0  . lidocaine-prilocaine (EMLA) cream Apply 1 application topically as needed. 30 g 0  . oxyCODONE (OXY IR/ROXICODONE) 5 MG immediate release tablet Take 1-2 tablets (5-10 mg total) by mouth  every 6 (six) hours as needed (pain). 30 tablet 0  . prochlorperazine (COMPAZINE) 10 MG tablet Take 1 tablet (10 mg total) by mouth every 6 (six) hours as needed for nausea or vomiting. 30 tablet 0   No facility-administered medications prior to visit.     Allergies:   Amoxicillin-pot clavulanate and Bactrim   Social History   Social History  .  Marital Status: Single    Spouse Name: N/A  . Number of Children: N/A  . Years of Education: N/A   Social History Main Topics  . Smoking status: Never Smoker   . Smokeless tobacco: Never Used  . Alcohol Use: Yes     Comment: occasionally  . Drug Use: No  . Sexual Activity: Not Asked   Other Topics Concern  . None   Social History Narrative     Family History:  The patient's    family history includes Heart disease in his maternal grandfather.   ROS:   Please see the history of present illness.    Review of Systems  Constitution: Positive for decreased appetite, night sweats and weight loss.  Respiratory: Positive for cough.   Musculoskeletal: Positive for back pain.  Gastrointestinal: Positive for constipation.   All other systems reviewed and are negative.   Physical Exam:    VS:  BP 115/62 mmHg  Pulse 96  SpO2 99%   GEN: Well nourished, well developed, in no acute distress HEENT: normal Neck: no JVD, no masses Cardiac: Normal S1/S2,  RRR; no murmurs, rubs, or gallops, no edema    Respiratory:  clear to auscultation bilaterally; no wheezing, rhonchi or rales GI: soft, nontender, nondistended MS: no deformity or atrophy Skin: warm and dry Neuro: No focal deficits  Psych: Alert and oriented x 3, normal affect  Wt Readings from Last 3 Encounters:  04/20/15 159 lb 4.8 oz (72.258 kg)  04/15/15 159 lb (72.122 kg)  04/06/15 164 lb 1 oz (74.418 kg)      Studies/Labs Reviewed:     EKG:  EKG is   ordered today.  The ekg ordered today demonstrates sinus tachy, HR 113, rightward axis, QTc 419 ms, no changes  Recent Labs: 03/29/2015: B Natriuretic Peptide 132.2*; Magnesium 2.1; TSH 0.945 04/20/2015: ALT 34; BUN 5.5*; Creatinine 0.7; HGB 7.9*; Platelets 543*; Potassium 3.7; Sodium 138   Recent Lipid Panel No results found for: CHOL, TRIG, HDL, CHOLHDL, VLDL, LDLCALC, LDLDIRECT  Additional studies/ records that were reviewed today include:   Echo 04/03/15 Mild focal  basal septal hypertrophy, EF 55-60%, no RWMA, mild to mod reduced RVSF, faint targets off of TV on ventricular and atrial side may be redundant chordae (cannot r/o vegetation), small pericardial effusion  Echo 04/02/15 EF 60-65%, mild pericardial effusion  Echo 04/02/15 Large pericardial effusion resolved  Pericardiocentesis 04/01/15 Successful pericardiocentesis with removal of 550 cc serosanguinous fluid  Echo 03/29/15 EF 55-60%, no RWMA, large pericardial effusion, findings concerning for tamponade   ASSESSMENT:     No diagnosis found.  PLAN:     In order of problems listed above:  1. Pericardial effusion - s/p tap.  Cytology was neg.  But, likely effusion is related to Lymphoma.  Today's echocardiogram shows severe circumferential pericardial effusion in maximal diameter 35 mm, there was right /atrial ventricular chamber collapse, and evidence for increased RV-LV interaction demonstrated by respirophasic changes in transmitral velocities. Features were consistent with impending tamponade physiology.  This was discussed with the patient who is in denial, feels overwhelmed and depressed  with the overall situation dealing with cancer and record pericardial effusion. He was strongly advised to proceed to the ER, or direct admission today however he refuses. He is accompanied by his mom who states that she is a Marine scientist and lives with him and can monitor him closely over the weekend and with any signs of hemodynamic instability she will transfer him to the ER. He is advised to maintain good fluid intake.  CT surgery is being called their exit from the clinic and a follow-up appointment to schedule pericardial window placement is scheduled for Monday at 2:30 PM.  I have spent or 1 hour with the patient, reviewing his 2 days and prior echocardiograms, discussing with patient and his mom the severity of the situation and trying to persuade him to go to the ER.  2. Lymphoma - FU with oncology as  planned.   Follow-up in the clinic after per cardial window was placed, for now we'll schedule for 4 weeks from now.  Signed, Dorothy Spark, MD  04/23/2015 11:21 AM    [address], [place], [place]  [address] Phone: [phone]; Fax: [phone]

## 2015-04-23 NOTE — Progress Notes (Signed)
Mr. Slagowski presented for a LIMITED echocardiogram for pericardial effusion. Noted large pericardial effusion. DOD (Dr. Meda Coffee) was notified and she is now seeing the patient.  Jeffery Crane, [place] 04/23/2015

## 2015-04-23 NOTE — Patient Instructions (Signed)
Medication Instructions:  None  Labwork: None  Testing/Procedures: None  Follow-Up:   Any Other Special Instructions Will Be Listed Below (If Applicable).  You have been referred to TCTS (Cardiothoracic Surgeon).      If you need a refill on your cardiac medications before your next appointment, please call your pharmacy.

## 2015-04-26 ENCOUNTER — Other Ambulatory Visit: Payer: Self-pay | Admitting: *Deleted

## 2015-04-26 ENCOUNTER — Encounter: Payer: Self-pay | Admitting: Thoracic Surgery (Cardiothoracic Vascular Surgery)

## 2015-04-26 ENCOUNTER — Institutional Professional Consult (permissible substitution) (INDEPENDENT_AMBULATORY_CARE_PROVIDER_SITE_OTHER): Payer: Self-pay | Admitting: Thoracic Surgery (Cardiothoracic Vascular Surgery)

## 2015-04-26 VITALS — BP 128/81 | HR 111 | Resp 16 | Ht 70.0 in | Wt 159.0 lb

## 2015-04-26 DIAGNOSIS — I319 Disease of pericardium, unspecified: Secondary | ICD-10-CM

## 2015-04-26 DIAGNOSIS — I3139 Other pericardial effusion (noninflammatory): Secondary | ICD-10-CM

## 2015-04-26 DIAGNOSIS — I313 Pericardial effusion (noninflammatory): Secondary | ICD-10-CM

## 2015-04-26 NOTE — Progress Notes (Signed)
PCP is [facility] Referring Provider is Dorothy Spark, MD  Chief Complaint  Patient presents with  . Pericardial Effusion    ECHO 04/23/15    HPI: [name] Is sent for consultation regarding a pericardial effusion.  He is a 20 year old man recently diagnosed with stage III nodular sclerosing Hodgkin's lymphoma who has started treatment with ABVD. Prior to initiating treatment he had a echocardiogram which showed a large pericardial effusion. He was asymptomatic and initially the plan was to see how he responds to treatment. However on a PET/CT was found again to have a large pericardial effusion. He underwent pericardiocentesis drain was placed. He did have some bleeding after the drain was removed. He has remained asymptomatic throughout despite echocardiographic signs of "tamponade." On Friday he had an follow-up echocardiogram which again showed a large pericardial effusion.  He denies any chest pain, swelling in his legs, headaches or visual changes, orthopnea or paroxysmal nocturnal dyspnea. He does complain of shortness of breath with exertion which is unchanged as well as general fatigue.   Past Medical History  Diagnosis Date  . Adult ADHD   . Pneumonia   . Headache   . GERD (gastroesophageal reflux disease)   . 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 Video bronchoscopy Bilateral 01/22/2015    Procedure: VIDEO BRONCHOSCOPY WITH FLUORO;  Surgeon: Marshell Garfinkel, MD;  Location: [facility];  Service: Cardiopulmonary;  Laterality: Bilateral;  . Supraclavical node biopsy Right 03/15/2015    Procedure: RIGHT SUPRACLAVICAL/CERVICAL LYMPH NODE BIOPSY;  Surgeon: [name], MD;  Location: [facility];  Service: Thoracic;  Laterality: Right;  . Scalene node biopsy Right 03/15/2015    Procedure: BIOPSY SCALENE NODE;  Surgeon: [name], MD;   Location: [facility];  Service: Thoracic;  Laterality: Right;  . Cardiac catheterization N/A 04/01/2015    Procedure: Pericardiocentesis;  Surgeon: Sherren Mocha, MD;  Location: [facility];  Service: Cardiovascular;  Laterality: N/A;    Family History  Problem Relation Age of Onset  . Heart disease Maternal Grandfather     Social History Social History  Substance Use Topics  . Smoking status: Never Smoker   . Smokeless tobacco: Never Used  . Alcohol Use: Yes     Comment: occasionally    Current Outpatient Prescriptions  Medication Sig Dispense Refill  . allopurinol (ZYLOPRIM) 100 MG tablet Take 1 tablet (100 mg total) by mouth 2 (two) times daily. 60 tablet 3  . HYDROcodone-homatropine (HYCODAN) 5-1.5 MG/5ML syrup Take 5 mLs by mouth every 6 (six) hours as needed for cough. 120 mL 0  . lidocaine-prilocaine (EMLA) cream Apply 1 application topically as needed. 30 g 0  . oxyCODONE (OXY IR/ROXICODONE) 5 MG immediate release tablet Take 1-2 tablets (5-10 mg total) by mouth every 6 (six) hours as needed (pain). 30 tablet 0  . prochlorperazine (COMPAZINE) 10 MG tablet Take 1 tablet (10 mg total) by mouth every 6 (six) hours as needed for nausea or vomiting. 30 tablet 0   No current facility-administered medications for this visit.    ALLERGIES Amoxacillin Sulfa  Review of Systems See history of present illness, otherwise negative   BP 128/81 mmHg  Pulse 111  Resp 16  Ht 5\' 10"  (1.778 m)  Wt 159 lb (72.122 kg)  BMI 22.81 kg/m2  SpO2 98% Physical Exam 20 year old man in no acute distress Alert and oriented  3 with no focal deficits Well-healed scar right neck, no JVD Cardiac tachycardic with regular rhythm, normal S1 and S2, heart sounds are clear Lungs clear with equal breath sounds bilaterally Abdomen soft nontender Extremities are without clubbing cyanosis or edema  Diagnostic Tests: Study Conclusions  - Left ventricle: The cavity size was normal. Wall  thickness was  normal. Systolic function was normal. The estimated ejection  fraction was in the range of 60% to 65%. Wall motion was normal;  there were no regional wall motion abnormalities. - Pericardium, extracardiac: A large, free-flowing pericardial  effusion was identified circumferential to the heart. The fluid  had no internal echoes. There was right /atrial ventricular  chamber collapse. There was evidence for increased RV-LV  interaction demonstrated by respirophasic changes in transmitral  velocities. Features were consistent with impending tamponade  physiology.  Impressions:  - Pericardial effusion has reaccumulated post pericardiocentesis.  Discussed with Jeffery Crane, echosonographer, who relayed that Dr.  Meda Crane is now evaluating patient in clinic.  I personally reviewed the echocardiogram images and concur with the findings as noted above.  Impression: 20 year old man with stage III nodular sclerosing Hodgkin's disease with a large pericardial effusion. This has been treated once previously with pericardiocentesis but reaccumulated relatively rapidly. He is referred for pericardial window. He does have echocardiographic signs of tamponade but no clinical signs of it.   I discussed options including continued observation, repeat pericardiocentesis, and subxiphoid pericardial window. We reviewed the relative advantages and disadvantages of each of these approaches. I described the operation him in detail including the need for general anesthesia, the incision to be used, the drainage tube postoperatively, the expected hospital stay, and overall recovery. He understands the indications, risks, benefits, and alternatives. He understands the risk include, but are not limited to death, DVT, PE, bleeding, cardiac injury, recurrent pericardial effusion, infection, as well as other unforeseeable complications. He accepts the risks and wishes to  proceed.  Plan:  Subxiphoid pericardial window on Wednesday, 04/28/2015. [name], MD [facility] [phone]

## 2015-04-27 ENCOUNTER — Other Ambulatory Visit: Payer: Self-pay

## 2015-04-27 ENCOUNTER — Encounter (HOSPITAL_COMMUNITY): Payer: Self-pay | Admitting: *Deleted

## 2015-04-27 ENCOUNTER — Encounter (HOSPITAL_COMMUNITY): Payer: Self-pay | Admitting: Emergency Medicine

## 2015-04-27 MED ORDER — VANCOMYCIN HCL IN DEXTROSE 1-5 GM/200ML-% IV SOLN: 1000.0000 mg | INTRAVENOUS | Status: AC

## 2015-04-27 NOTE — Progress Notes (Signed)
Pt denies SOB and chest pain but stated that he is under the care of Dr. Tamala Julian cardiology. Pt denies having a stress test. Pt made aware to stop taking Aspirin, fish oil, vitamins and herbal medications. Do not take any NSAIDs ie: Ibuprofen, Advil, Naproxen, BC and Goody Powder or any medication containing Aspirin. Pt verbalized understanding of all pre-op instructions. Anesthesia asked to review pt history ( see note).

## 2015-04-28 ENCOUNTER — Inpatient Hospital Stay (HOSPITAL_COMMUNITY)
Admission: RE | Admit: 2015-04-28 | Payer: Self-pay | Source: Ambulatory Visit | Admitting: Thoracic Surgery (Cardiothoracic Vascular Surgery)

## 2015-04-28 HISTORY — DX: Anemia, unspecified: D64.9

## 2015-04-28 SURGERY — CREATION, PERICARDIAL WINDOW, SUBXIPHOID APPROACH
Anesthesia: General | Site: Chest

## 2015-04-28 MED ORDER — MIDAZOLAM HCL 2 MG/2ML IJ SOLN
INTRAMUSCULAR | Status: AC
  Filled 2015-04-28: qty 2

## 2015-04-28 MED ORDER — ESMOLOL HCL 100 MG/10ML IV SOLN
INTRAVENOUS | Status: AC
  Filled 2015-04-28: qty 10

## 2015-04-28 MED ORDER — FENTANYL CITRATE (PF) 250 MCG/5ML IJ SOLN
INTRAMUSCULAR | Status: AC
  Filled 2015-04-28: qty 5

## 2015-04-28 MED ORDER — PROPOFOL 10 MG/ML IV BOLUS
INTRAVENOUS | Status: AC
  Filled 2015-04-28: qty 40

## 2015-04-28 NOTE — Progress Notes (Signed)
Left message on pt's home # with time of arrival of 8:45 AM for surgery tomorrow.

## 2015-04-29 ENCOUNTER — Inpatient Hospital Stay (HOSPITAL_COMMUNITY): Payer: Self-pay

## 2015-04-29 ENCOUNTER — Inpatient Hospital Stay (HOSPITAL_COMMUNITY): Payer: Self-pay | Admitting: Anesthesiology

## 2015-04-29 ENCOUNTER — Encounter (HOSPITAL_COMMUNITY): Payer: Self-pay | Admitting: General Practice

## 2015-04-29 ENCOUNTER — Encounter (HOSPITAL_COMMUNITY)
Admission: RE | Disposition: A | Discharge status: Home / Self Care | Payer: Self-pay | Source: Ambulatory Visit | Attending: Thoracic Surgery (Cardiothoracic Vascular Surgery)

## 2015-04-29 ENCOUNTER — Inpatient Hospital Stay (HOSPITAL_COMMUNITY)
Admission: RE | Admit: 2015-04-29 | Discharge: 2015-05-01 | DRG: 271 | Disposition: A | Discharge status: Home / Self Care | Payer: Self-pay | Source: Ambulatory Visit | Attending: Thoracic Surgery (Cardiothoracic Vascular Surgery) | Admitting: Thoracic Surgery (Cardiothoracic Vascular Surgery)

## 2015-04-29 DIAGNOSIS — Z09 Encounter for follow-up examination after completed treatment for conditions other than malignant neoplasm: Secondary | ICD-10-CM

## 2015-04-29 DIAGNOSIS — I3139 Other pericardial effusion (noninflammatory): Secondary | ICD-10-CM | POA: Diagnosis present

## 2015-04-29 DIAGNOSIS — F909 Attention-deficit hyperactivity disorder, unspecified type: Secondary | ICD-10-CM | POA: Diagnosis present

## 2015-04-29 DIAGNOSIS — I313 Pericardial effusion (noninflammatory): Principal | ICD-10-CM | POA: Diagnosis present

## 2015-04-29 DIAGNOSIS — J939 Pneumothorax, unspecified: Secondary | ICD-10-CM

## 2015-04-29 DIAGNOSIS — K219 Gastro-esophageal reflux disease without esophagitis: Secondary | ICD-10-CM | POA: Diagnosis present

## 2015-04-29 DIAGNOSIS — I314 Cardiac tamponade: Secondary | ICD-10-CM | POA: Diagnosis present

## 2015-04-29 DIAGNOSIS — Z79899 Other long term (current) drug therapy: Secondary | ICD-10-CM

## 2015-04-29 DIAGNOSIS — F17200 Nicotine dependence, unspecified, uncomplicated: Secondary | ICD-10-CM | POA: Diagnosis present

## 2015-04-29 DIAGNOSIS — C8110 Nodular sclerosis classical Hodgkin lymphoma, unspecified site: Secondary | ICD-10-CM | POA: Diagnosis present

## 2015-04-29 HISTORY — PX: TEE WITHOUT CARDIOVERSION: SHX5443

## 2015-04-29 HISTORY — PX: SUBXYPHOID PERICARDIAL WINDOW: SHX5075

## 2015-04-29 LAB — BLOOD GAS, ARTERIAL
Acid-Base Excess: 1.4 mmol/L (ref 0.0–2.0)
BICARBONATE: 25.2 meq/L — AB (ref 20.0–24.0)
Drawn by: 42180
FIO2: 0.21
O2 SAT: 97.9 %
PO2 ART: 112 mmHg — AB (ref 80.0–100.0)
Patient temperature: 98.6
TCO2: 26.4 mmol/L (ref 0–100)
pCO2 arterial: 38.7 mmHg (ref 35.0–45.0)
pH, Arterial: 7.43 (ref 7.350–7.450)

## 2015-04-29 LAB — CBC
HEMATOCRIT: 27.7 % — AB (ref 39.0–52.0)
HEMOGLOBIN: 8.4 g/dL — AB (ref 13.0–17.0)
MCH: 23.3 pg — AB (ref 26.0–34.0)
MCHC: 30.3 g/dL (ref 30.0–36.0)
MCV: 76.9 fL — ABNORMAL LOW (ref 78.0–100.0)
Platelets: 436 10*3/uL — ABNORMAL HIGH (ref 150–400)
RBC: 3.6 MIL/uL — ABNORMAL LOW (ref 4.22–5.81)
RDW: 16.8 % — ABNORMAL HIGH (ref 11.5–15.5)
WBC: 6.7 10*3/uL (ref 4.0–10.5)

## 2015-04-29 LAB — URINALYSIS, ROUTINE W REFLEX MICROSCOPIC
Bilirubin Urine: NEGATIVE
Glucose, UA: NEGATIVE mg/dL
Hgb urine dipstick: NEGATIVE
KETONES UR: NEGATIVE mg/dL
LEUKOCYTES UA: NEGATIVE
NITRITE: NEGATIVE
PH: 6 (ref 5.0–8.0)
Protein, ur: NEGATIVE mg/dL
SPECIFIC GRAVITY, URINE: 1.022 (ref 1.005–1.030)

## 2015-04-29 LAB — SURGICAL PCR SCREEN
MRSA, PCR: NEGATIVE
Staphylococcus aureus: NEGATIVE

## 2015-04-29 LAB — COMPREHENSIVE METABOLIC PANEL
ALK PHOS: 164 U/L — AB (ref 38–126)
ALT: 35 U/L (ref 17–63)
ANION GAP: 12 (ref 5–15)
AST: 17 U/L (ref 15–41)
Albumin: 2.5 g/dL — ABNORMAL LOW (ref 3.5–5.0)
BILIRUBIN TOTAL: 0.2 mg/dL — AB (ref 0.3–1.2)
BUN: 6 mg/dL (ref 6–20)
CALCIUM: 8.9 mg/dL (ref 8.9–10.3)
CO2: 22 mmol/L (ref 22–32)
Chloride: 107 mmol/L (ref 101–111)
Creatinine, Ser: 0.59 mg/dL — ABNORMAL LOW (ref 0.61–1.24)
GFR calc non Af Amer: >60 mL/min (ref >60)
GLUCOSE: 108 mg/dL — AB (ref 65–99)
Potassium: 4.1 mmol/L (ref 3.5–5.1)
Sodium: 141 mmol/L (ref 135–145)
TOTAL PROTEIN: 6.7 g/dL (ref 6.5–8.1)

## 2015-04-29 LAB — PROTIME-INR
INR: 1.18 (ref 0.00–1.49)
Prothrombin Time: 15.2 seconds (ref 11.6–15.2)

## 2015-04-29 LAB — APTT: aPTT: 24 seconds (ref 24–37)

## 2015-04-29 LAB — GLUCOSE, CAPILLARY
GLUCOSE-CAPILLARY: 138 mg/dL — AB (ref 65–99)
Glucose-Capillary: 109 mg/dL — ABNORMAL HIGH (ref 65–99)

## 2015-04-29 LAB — TYPE AND SCREEN
ABO/RH(D): AB POS
Antibody Screen: NEGATIVE

## 2015-04-29 SURGERY — CREATION, PERICARDIAL WINDOW, SUBXIPHOID APPROACH
Anesthesia: General | Site: Chest

## 2015-04-29 MED ORDER — FENTANYL 40 MCG/ML IV SOLN
INTRAVENOUS | Status: DC
  Administered 2015-04-29: 30 ug via INTRAVENOUS
  Administered 2015-04-29: 45 ug via INTRAVENOUS
  Administered 2015-04-29: 14:00:00 via INTRAVENOUS
  Administered 2015-04-30 (×3): 15 ug via INTRAVENOUS
  Administered 2015-04-30: 0 ug via INTRAVENOUS
  Administered 2015-04-30: 60 ug via INTRAVENOUS
  Administered 2015-05-01 (×2): 0 ug via INTRAVENOUS

## 2015-04-29 MED ORDER — KCL IN DEXTROSE-NACL 10-5-0.45 MEQ/L-%-% IV SOLN
INTRAVENOUS | Status: DC
  Administered 2015-04-29 – 2015-04-30 (×2): via INTRAVENOUS
  Filled 2015-04-29 (×3): qty 1000

## 2015-04-29 MED ORDER — DIPHENHYDRAMINE HCL 12.5 MG/5ML PO ELIX: 12.5000 mg | ORAL_SOLUTION | Freq: Four times a day (QID) | ORAL | Status: DC | PRN

## 2015-04-29 MED ORDER — ACETAMINOPHEN 160 MG/5ML PO SOLN: 1000.0000 mg | Freq: Four times a day (QID) | ORAL | Status: DC

## 2015-04-29 MED ORDER — PROPOFOL 10 MG/ML IV BOLUS
INTRAVENOUS | Status: AC
  Filled 2015-04-29: qty 20

## 2015-04-29 MED ORDER — LACTATED RINGERS IV SOLN
INTRAVENOUS | Status: DC
  Administered 2015-04-29: 11:00:00 via INTRAVENOUS

## 2015-04-29 MED ORDER — POTASSIUM CHLORIDE 10 MEQ/50ML IV SOLN: 10.0000 meq | Freq: Every day | INTRAVENOUS | Status: DC | PRN

## 2015-04-29 MED ORDER — 0.9 % SODIUM CHLORIDE (POUR BTL) OPTIME
TOPICAL | Status: DC | PRN
  Administered 2015-04-29: 1000 mL

## 2015-04-29 MED ORDER — SUGAMMADEX SODIUM 200 MG/2ML IV SOLN
INTRAVENOUS | Status: DC | PRN
  Administered 2015-04-29: 150 mg via INTRAVENOUS

## 2015-04-29 MED ORDER — OXYCODONE HCL 5 MG/5ML PO SOLN: 5.0000 mg | Freq: Once | ORAL | Status: AC | PRN

## 2015-04-29 MED ORDER — OXYCODONE HCL 5 MG PO TABS
ORAL_TABLET | ORAL | Status: AC
  Filled 2015-04-29: qty 3

## 2015-04-29 MED ORDER — MIDAZOLAM HCL 2 MG/2ML IJ SOLN
2.0000 mg | Freq: Once | INTRAMUSCULAR | Status: AC
  Administered 2015-04-29: 2 mg via INTRAVENOUS

## 2015-04-29 MED ORDER — INSULIN ASPART 100 UNIT/ML ~~LOC~~ SOLN: 0.0000 [IU] | Freq: Four times a day (QID) | SUBCUTANEOUS | Status: DC

## 2015-04-29 MED ORDER — NALOXONE HCL 0.4 MG/ML IJ SOLN: 0.4000 mg | INTRAMUSCULAR | Status: DC | PRN

## 2015-04-29 MED ORDER — ONDANSETRON HCL 4 MG/2ML IJ SOLN: 4.0000 mg | Freq: Four times a day (QID) | INTRAMUSCULAR | Status: DC | PRN

## 2015-04-29 MED ORDER — VANCOMYCIN HCL IN DEXTROSE 1-5 GM/200ML-% IV SOLN
1000.0000 mg | Freq: Two times a day (BID) | INTRAVENOUS | Status: AC
  Administered 2015-04-30: 1000 mg via INTRAVENOUS
  Filled 2015-04-29: qty 200

## 2015-04-29 MED ORDER — FENTANYL CITRATE (PF) 100 MCG/2ML IJ SOLN
INTRAMUSCULAR | Status: AC
  Filled 2015-04-29: qty 4

## 2015-04-29 MED ORDER — LIDOCAINE-PRILOCAINE 2.5-2.5 % EX CREA
1.0000 "application " | TOPICAL_CREAM | CUTANEOUS | Status: DC | PRN
  Filled 2015-04-29: qty 5

## 2015-04-29 MED ORDER — VANCOMYCIN HCL IN DEXTROSE 1-5 GM/200ML-% IV SOLN
1000.0000 mg | INTRAVENOUS | Status: AC
  Administered 2015-04-29: 1000 mg via INTRAVENOUS
  Filled 2015-04-29: qty 200

## 2015-04-29 MED ORDER — FENTANYL CITRATE (PF) 100 MCG/2ML IJ SOLN
INTRAMUSCULAR | Status: AC
  Administered 2015-04-29 (×3): 50 ug via INTRAVENOUS
  Administered 2015-04-29: 75 ug via INTRAVENOUS
  Administered 2015-04-29 (×2): 50 ug via INTRAVENOUS
  Filled 2015-04-29: qty 2

## 2015-04-29 MED ORDER — MUPIROCIN 2 % EX OINT
1.0000 "application " | TOPICAL_OINTMENT | Freq: Once | CUTANEOUS | Status: AC
  Administered 2015-04-29: 1 via TOPICAL
  Filled 2015-04-29: qty 22

## 2015-04-29 MED ORDER — ETOMIDATE 2 MG/ML IV SOLN
INTRAVENOUS | Status: DC | PRN
  Administered 2015-04-29: 20 mg via INTRAVENOUS

## 2015-04-29 MED ORDER — ALLOPURINOL 100 MG PO TABS
100.0000 mg | ORAL_TABLET | Freq: Two times a day (BID) | ORAL | Status: DC
  Administered 2015-04-29 – 2015-05-01 (×4): 100 mg via ORAL
  Filled 2015-04-29 (×4): qty 1

## 2015-04-29 MED ORDER — SENNOSIDES-DOCUSATE SODIUM 8.6-50 MG PO TABS
1.0000 | ORAL_TABLET | Freq: Every day | ORAL | Status: DC
  Administered 2015-04-29: 1 via ORAL
  Filled 2015-04-29 (×2): qty 1

## 2015-04-29 MED ORDER — KETOROLAC TROMETHAMINE 30 MG/ML IJ SOLN
30.0000 mg | Freq: Four times a day (QID) | INTRAMUSCULAR | Status: AC
  Administered 2015-04-29 – 2015-05-01 (×7): 30 mg via INTRAVENOUS
  Filled 2015-04-29 (×7): qty 1

## 2015-04-29 MED ORDER — BISACODYL 5 MG PO TBEC
10.0000 mg | DELAYED_RELEASE_TABLET | Freq: Every day | ORAL | Status: DC
  Administered 2015-04-29 – 2015-04-30 (×2): 10 mg via ORAL
  Filled 2015-04-29 (×2): qty 2

## 2015-04-29 MED ORDER — SUGAMMADEX SODIUM 200 MG/2ML IV SOLN
INTRAVENOUS | Status: AC
  Filled 2015-04-29: qty 2

## 2015-04-29 MED ORDER — ROCURONIUM BROMIDE 50 MG/5ML IV SOLN
INTRAVENOUS | Status: AC
  Filled 2015-04-29: qty 1

## 2015-04-29 MED ORDER — TRAMADOL HCL 50 MG PO TABS
50.0000 mg | ORAL_TABLET | Freq: Four times a day (QID) | ORAL | Status: DC | PRN
  Administered 2015-05-01: 100 mg via ORAL
  Filled 2015-04-29: qty 2

## 2015-04-29 MED ORDER — KETOROLAC TROMETHAMINE 30 MG/ML IJ SOLN
INTRAMUSCULAR | Status: AC
  Filled 2015-04-29: qty 1

## 2015-04-29 MED ORDER — LACTATED RINGERS IV SOLN
INTRAVENOUS | Status: DC | PRN
  Administered 2015-04-29 (×2): via INTRAVENOUS

## 2015-04-29 MED ORDER — LIDOCAINE HCL (CARDIAC) 20 MG/ML IV SOLN
INTRAVENOUS | Status: DC | PRN
  Administered 2015-04-29: 36 mg via INTRATRACHEAL
  Administered 2015-04-29: 100 mg via INTRAVENOUS

## 2015-04-29 MED ORDER — SODIUM CHLORIDE 0.9% FLUSH: 9.0000 mL | INTRAVENOUS | Status: DC | PRN

## 2015-04-29 MED ORDER — MIDAZOLAM HCL 5 MG/5ML IJ SOLN
INTRAMUSCULAR | Status: DC | PRN
  Administered 2015-04-29 (×2): 1 mg via INTRAVENOUS

## 2015-04-29 MED ORDER — FENTANYL CITRATE (PF) 100 MCG/2ML IJ SOLN
100.0000 ug | Freq: Once | INTRAMUSCULAR | Status: AC
  Administered 2015-04-29: 100 ug via INTRAVENOUS

## 2015-04-29 MED ORDER — PANTOPRAZOLE SODIUM 40 MG PO TBEC
40.0000 mg | DELAYED_RELEASE_TABLET | Freq: Every day | ORAL | Status: DC
Start: 2015-04-29 — End: 2015-05-01
  Administered 2015-04-29 – 2015-05-01 (×3): 40 mg via ORAL
  Filled 2015-04-29 (×3): qty 1

## 2015-04-29 MED ORDER — OXYCODONE HCL 5 MG PO TABS
5.0000 mg | ORAL_TABLET | ORAL | Status: DC | PRN
  Administered 2015-04-29: 10 mg via ORAL
  Administered 2015-05-01: 5 mg via ORAL
  Filled 2015-04-29: qty 1

## 2015-04-29 MED ORDER — DIPHENHYDRAMINE HCL 50 MG/ML IJ SOLN: 12.5000 mg | Freq: Four times a day (QID) | INTRAMUSCULAR | Status: DC | PRN

## 2015-04-29 MED ORDER — FENTANYL CITRATE (PF) 250 MCG/5ML IJ SOLN
INTRAMUSCULAR | Status: AC
  Filled 2015-04-29: qty 5

## 2015-04-29 MED ORDER — MIDAZOLAM HCL 2 MG/2ML IJ SOLN
INTRAMUSCULAR | Status: AC
  Filled 2015-04-29: qty 2

## 2015-04-29 MED ORDER — ONDANSETRON HCL 4 MG/2ML IJ SOLN
INTRAMUSCULAR | Status: AC
  Filled 2015-04-29: qty 2

## 2015-04-29 MED ORDER — ACETAMINOPHEN 500 MG PO TABS
1000.0000 mg | ORAL_TABLET | Freq: Four times a day (QID) | ORAL | Status: DC
  Administered 2015-04-29 – 2015-04-30 (×5): 1000 mg via ORAL
  Filled 2015-04-29 (×7): qty 2

## 2015-04-29 MED ORDER — ROCURONIUM BROMIDE 100 MG/10ML IV SOLN
INTRAVENOUS | Status: DC | PRN
  Administered 2015-04-29: 30 mg via INTRAVENOUS
  Administered 2015-04-29: 10 mg via INTRAVENOUS
  Administered 2015-04-29: 40 mg via INTRAVENOUS

## 2015-04-29 MED ORDER — FENTANYL CITRATE (PF) 100 MCG/2ML IJ SOLN
25.0000 ug | INTRAMUSCULAR | Status: DC | PRN
  Administered 2015-04-29 (×2): 50 ug via INTRAVENOUS

## 2015-04-29 MED ORDER — FENTANYL 40 MCG/ML IV SOLN
INTRAVENOUS | Status: AC
  Administered 2015-04-29: 90 ug
  Administered 2015-04-29: 110 ug via INTRAVENOUS
  Filled 2015-04-29: qty 25

## 2015-04-29 MED ORDER — PHENYLEPHRINE HCL 10 MG/ML IJ SOLN
10.0000 mg | INTRAVENOUS | Status: DC | PRN
  Administered 2015-04-29: 50 ug/min via INTRAVENOUS

## 2015-04-29 MED ORDER — PROPOFOL 10 MG/ML IV BOLUS
INTRAVENOUS | Status: DC | PRN
  Administered 2015-04-29: 40 mg via INTRAVENOUS
  Administered 2015-04-29: 50 mg via INTRAVENOUS

## 2015-04-29 MED ORDER — OXYCODONE HCL 5 MG PO TABS
5.0000 mg | ORAL_TABLET | Freq: Once | ORAL | Status: AC | PRN
  Administered 2015-04-29: 5 mg via ORAL

## 2015-04-29 SURGICAL SUPPLY — 44 items
CANISTER SUCTION 2500CC (MISCELLANEOUS) ×4 IMPLANT
CATH THORACIC 28FR (CATHETERS) IMPLANT
CATH THORACIC 28FR RT ANG (CATHETERS) IMPLANT
CATH THORACIC 36FR (CATHETERS) IMPLANT
CATH THORACIC 36FR RT ANG (CATHETERS) IMPLANT
CONT SPEC 4OZ CLIKSEAL STRL BL (MISCELLANEOUS) ×8 IMPLANT
DRAIN CHANNEL 28F RND 3/8 FF (WOUND CARE) IMPLANT
DRAIN CHANNEL 32F RND 10.7 FF (WOUND CARE) IMPLANT
DRAPE LAPAROSCOPIC ABDOMINAL (DRAPES) ×4 IMPLANT
DRAPE SLUSH/WARMER DISC (DRAPES) ×4 IMPLANT
ELECT REM PT RETURN 9FT ADLT (ELECTROSURGICAL) ×4
ELECTRODE REM PT RTRN 9FT ADLT (ELECTROSURGICAL) ×2 IMPLANT
GAUZE SPONGE 4X4 12PLY STRL (GAUZE/BANDAGES/DRESSINGS) ×8 IMPLANT
GLOVE BIOGEL PI IND STRL 7.0 (GLOVE) ×6 IMPLANT
GLOVE BIOGEL PI INDICATOR 7.0 (GLOVE) ×6
GLOVE SURG SIGNA 7.5 PF LTX (GLOVE) ×8 IMPLANT
GOWN STRL REUS W/ TWL LRG LVL3 (GOWN DISPOSABLE) ×2 IMPLANT
GOWN STRL REUS W/ TWL XL LVL3 (GOWN DISPOSABLE) ×2 IMPLANT
GOWN STRL REUS W/TWL LRG LVL3 (GOWN DISPOSABLE) ×2
GOWN STRL REUS W/TWL XL LVL3 (GOWN DISPOSABLE) ×2
KIT BASIN OR (CUSTOM PROCEDURE TRAY) ×4 IMPLANT
KIT ROOM TURNOVER OR (KITS) ×4 IMPLANT
KIT SUCTION CATH 14FR (SUCTIONS) IMPLANT
NS IRRIG 1000ML POUR BTL (IV SOLUTION) ×8 IMPLANT
PACK CHEST (CUSTOM PROCEDURE TRAY) ×4 IMPLANT
PAD ARMBOARD 7.5X6 YLW CONV (MISCELLANEOUS) ×8 IMPLANT
PAD ELECT DEFIB RADIOL ZOLL (MISCELLANEOUS) ×4 IMPLANT
SPONGE GAUZE 4X4 12PLY STER LF (GAUZE/BANDAGES/DRESSINGS) ×4 IMPLANT
STAPLER VISISTAT 35W (STAPLE) IMPLANT
SUT SILK  1 MH (SUTURE) ×4
SUT SILK 1 MH (SUTURE) ×4 IMPLANT
SUT VIC AB 1 CTX 36 (SUTURE) ×2
SUT VIC AB 1 CTX36XBRD ANBCTR (SUTURE) ×2 IMPLANT
SUT VIC AB 2-0 CTX 36 (SUTURE) ×4 IMPLANT
SUT VIC AB 3-0 X1 27 (SUTURE) ×4 IMPLANT
SWAB COLLECTION DEVICE MRSA (MISCELLANEOUS) IMPLANT
SYSTEM SAHARA CHEST DRAIN ATS (WOUND CARE) ×4 IMPLANT
TAPE CLOTH SURG 4X10 WHT LF (GAUZE/BANDAGES/DRESSINGS) ×4 IMPLANT
TOWEL OR 17X24 6PK STRL BLUE (TOWEL DISPOSABLE) ×4 IMPLANT
TOWEL OR 17X26 10 PK STRL BLUE (TOWEL DISPOSABLE) ×8 IMPLANT
TRAP SPECIMEN MUCOUS 40CC (MISCELLANEOUS) ×8 IMPLANT
TRAY FOLEY CATH 16FRSI W/METER (SET/KITS/TRAYS/PACK) ×4 IMPLANT
TUBE ANAEROBIC SPECIMEN COL (MISCELLANEOUS) IMPLANT
WATER STERILE IRR 1000ML POUR (IV SOLUTION) ×8 IMPLANT

## 2015-04-29 NOTE — Progress Notes (Signed)
OR called x 2 since pacu admission to have staff document  Chest tube/ spoke first with christy rn/ then [employer]

## 2015-04-29 NOTE — H&P (View-Only) (Signed)
PCP is [facility] Referring Provider is Dorothy Spark, MD  Chief Complaint  Patient presents with  . Pericardial Effusion    ECHO 04/23/15    HPI: Jeffery Crane Is sent for consultation regarding a pericardial effusion.  He is a 20 year old man recently diagnosed with stage III nodular sclerosing Hodgkin's lymphoma who has started treatment with ABVD. Prior to initiating treatment he had a echocardiogram which showed a large pericardial effusion. He was asymptomatic and initially the plan was to see how he responds to treatment. However on a PET/CT was found again to have a large pericardial effusion. He underwent pericardiocentesis drain was placed. He did have some bleeding after the drain was removed. He has remained asymptomatic throughout despite echocardiographic signs of "tamponade." On Friday he had an follow-up echocardiogram which again showed a large pericardial effusion.  He denies any chest pain, swelling in his legs, headaches or visual changes, orthopnea or paroxysmal nocturnal dyspnea. He does complain of shortness of breath with exertion which is unchanged as well as general fatigue.   Past Medical History  Diagnosis Date  . Adult ADHD   . Pneumonia   . Headache   . GERD (gastroesophageal reflux disease)   . 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 Video bronchoscopy Bilateral 01/22/2015    Procedure: VIDEO BRONCHOSCOPY WITH FLUORO;  Surgeon: Marshell Garfinkel, MD;  Location: [facility];  Service: Cardiopulmonary;  Laterality: Bilateral;  . Supraclavical node biopsy Right 03/15/2015    Procedure: RIGHT SUPRACLAVICAL/CERVICAL LYMPH NODE BIOPSY;  Surgeon: [name], MD;  Location: [facility];  Service: Thoracic;  Laterality: Right;  . Scalene node biopsy Right 03/15/2015    Procedure: BIOPSY SCALENE NODE;  Surgeon: [name], MD;   Location: [facility];  Service: Thoracic;  Laterality: Right;  . Cardiac catheterization N/A 04/01/2015    Procedure: Pericardiocentesis;  Surgeon: Sherren Mocha, MD;  Location: [facility];  Service: Cardiovascular;  Laterality: N/A;    Family History  Problem Relation Age of Onset  . Heart disease Maternal Grandfather     Social History Social History  Substance Use Topics  . Smoking status: Never Smoker   . Smokeless tobacco: Never Used  . Alcohol Use: Yes     Comment: occasionally    Current Outpatient Prescriptions  Medication Sig Dispense Refill  . allopurinol (ZYLOPRIM) 100 MG tablet Take 1 tablet (100 mg total) by mouth 2 (two) times daily. 60 tablet 3  . HYDROcodone-homatropine (HYCODAN) 5-1.5 MG/5ML syrup Take 5 mLs by mouth every 6 (six) hours as needed for cough. 120 mL 0  . lidocaine-prilocaine (EMLA) cream Apply 1 application topically as needed. 30 g 0  . oxyCODONE (OXY IR/ROXICODONE) 5 MG immediate release tablet Take 1-2 tablets (5-10 mg total) by mouth every 6 (six) hours as needed (pain). 30 tablet 0  . prochlorperazine (COMPAZINE) 10 MG tablet Take 1 tablet (10 mg total) by mouth every 6 (six) hours as needed for nausea or vomiting. 30 tablet 0   No current facility-administered medications for this visit.    ALLERGIES Amoxacillin Sulfa  Review of Systems See history of present illness, otherwise negative   BP 128/81 mmHg  Pulse 111  Resp 16  Ht 5\' 10"  (1.778 m)  Wt 159 lb (72.122 kg)  BMI 22.81 kg/m2  SpO2 98% Physical Exam 20 year old man in no acute distress Alert and oriented  3 with no focal deficits Well-healed scar right neck, no JVD Cardiac tachycardic with regular rhythm, normal S1 and S2, heart sounds are clear Lungs clear with equal breath sounds bilaterally Abdomen soft nontender Extremities are without clubbing cyanosis or edema  Diagnostic Tests: Study Conclusions  - Left ventricle: The cavity size was normal. Wall  thickness was  normal. Systolic function was normal. The estimated ejection  fraction was in the range of 60% to 65%. Wall motion was normal;  there were no regional wall motion abnormalities. - Pericardium, extracardiac: A large, free-flowing pericardial  effusion was identified circumferential to the heart. The fluid  had no internal echoes. There was right /atrial ventricular  chamber collapse. There was evidence for increased RV-LV  interaction demonstrated by respirophasic changes in transmitral  velocities. Features were consistent with impending tamponade  physiology.  Impressions:  - Pericardial effusion has reaccumulated post pericardiocentesis.  Discussed with Wyatt Mage, echosonographer, who relayed that Dr.  Meda Coffee is now evaluating patient in clinic.  I personally reviewed the echocardiogram images and concur with the findings as noted above.  Impression: 20 year old man with stage III nodular sclerosing Hodgkin's disease with a large pericardial effusion. This has been treated once previously with pericardiocentesis but reaccumulated relatively rapidly. He is referred for pericardial window. He does have echocardiographic signs of tamponade but no clinical signs of it.   I discussed options including continued observation, repeat pericardiocentesis, and subxiphoid pericardial window. We reviewed the relative advantages and disadvantages of each of these approaches. I described the operation him in detail including the need for general anesthesia, the incision to be used, the drainage tube postoperatively, the expected hospital stay, and overall recovery. He understands the indications, risks, benefits, and alternatives. He understands the risk include, but are not limited to death, DVT, PE, bleeding, cardiac injury, recurrent pericardial effusion, infection, as well as other unforeseeable complications. He accepts the risks and wishes to  proceed.  Plan:  Subxiphoid pericardial window on Wednesday, 04/28/2015. [name], MD [facility] [phone]

## 2015-04-29 NOTE — Progress Notes (Signed)
Patient Tx from PACU to 2W03. Patient A&O times 4. Oriented to the unit. Patient rating pain in site of chest tube at 4/5. No SOB. No chest pain. Chest tube to suction at 20. Call bell within reach. Will continue to monitor.   Domingo Dimes RN

## 2015-04-29 NOTE — Progress Notes (Signed)
  Echocardiogram 2D Echocardiogram has been performed.  Darlina Sicilian M 04/29/2015, 1:06 PM

## 2015-04-29 NOTE — Anesthesia Preprocedure Evaluation (Addendum)
Anesthesia Evaluation  Patient identified by MRN, date of birth, ID band Patient awake    Reviewed: Allergy & Precautions, NPO status , Patient's Chart, lab work & pertinent test results  Airway Mallampati: II   Neck ROM: full    Dental   Pulmonary Current Smoker,    breath sounds clear to auscultation       Cardiovascular negative cardio ROS   Rhythm:regular Rate:Normal     Neuro/Psych  Headaches,    GI/Hepatic GERD  ,  Endo/Other    Renal/GU      Musculoskeletal   Abdominal   Peds  Hematology  (+) Blood dyscrasia, anemia , Lymphoma   Anesthesia Other Findings   Reproductive/Obstetrics                            Anesthesia Physical Anesthesia Plan  ASA: II  Anesthesia Plan: General   Post-op Pain Management:    Induction: Intravenous  Airway Management Planned: Oral ETT  Additional Equipment: Arterial line  Intra-op Plan:   Post-operative Plan: Extubation in OR  Informed Consent: I have reviewed the patients History and Physical, chart, labs and discussed the procedure including the risks, benefits and alternatives for the proposed anesthesia with the patient or authorized representative who has indicated his/her understanding and acceptance.     Plan Discussed with: CRNA, Anesthesiologist and Surgeon  Anesthesia Plan Comments:         Anesthesia Quick Evaluation

## 2015-04-29 NOTE — Anesthesia Postprocedure Evaluation (Signed)
Anesthesia Post Note  Patient: Jeffery Crane  Procedure(s) Performed: Procedure(s) (LRB): SUBXYPHOID PERICARDIAL WINDOW (N/A) TRANSESOPHAGEAL ECHOCARDIOGRAM (TEE) (N/A)  Patient location during evaluation: PACU Anesthesia Type: General Level of consciousness: awake and alert and patient cooperative Pain management: pain level controlled Vital Signs Assessment: post-procedure vital signs reviewed and stable Respiratory status: spontaneous breathing and respiratory function stable Cardiovascular status: stable Anesthetic complications: no    Last Vitals:  Filed Vitals:   04/29/15 1400 04/29/15 1436  BP: 140/84   Pulse: 117 118  Temp:    Resp: 22 10    Last Pain:  Filed Vitals:   04/29/15 1437  PainSc: [place]

## 2015-04-29 NOTE — Op Note (Signed)
Jeffery Crane, Jeffery Crane NO.:  [id number]  MEDICAL RECORD NO.:  [id number]  LOCATION:  2W03C                        FACILITY:  [facility]  PHYSICIAN:  Revonda Standard. Roxan Hockey, M.D.DATE OF BIRTH:  Aug 27, 1995  DATE OF PROCEDURE:  04/29/2015 DATE OF DISCHARGE:                              OPERATIVE REPORT   PREOPERATIVE DIAGNOSIS:  Recurrent pericardial effusion.  POSTOPERATIVE DIAGNOSIS:  Recurrent pericardial effusion.  PROCEDURE:  Subxiphoid pericardial window.  SURGEON:  Revonda Standard. Roxan Hockey, M.D.  ASSISTANT:  Lars Pinks, PA.  ANESTHESIA:  General.  FINDINGS:  500 mL of bloody fluid evacuated.  No residual effusion by transesophageal echocardiography.  No significant hemodynamic change with drainage.  CLINICAL NOTE:  Jeffery Crane is a 20 year old gentleman, recently diagnosed with Hodgkin's lymphoma who has a recurrent pericardial effusion.  He originally had a large pericardial effusion with echocardiographic signs of tamponade but no clinical signs of tamponade. Pericardiocentesis drained the fluid.  A recent echocardiogram showed recurrence of the pericardial effusion again with echocardiographic evidence of early tamponade but no clinical signs.  The patient was advised to undergo subxiphoid pericardial window.  The nature of the procedure and the use of a drain postoperatively were discussed in detail with the patient.  He understood it would be done under general anesthesia.  The indications, risks, benefits, and alternatives were discussed in detail with the patient.  He accepted the risks and agreed to proceed.  OPERATIVE NOTE:  Jeffery Crane was brought to the operating room on April 29, 2015.  He was anesthetized and intubated.  Intravenous antibiotics were administered.  The chest and abdomen were prepped and draped in usual sterile fashion.  Transesophageal echocardiography was performed. It revealed a large pericardial effusion.  A midline  incision was made centered over the xiphoid process.  It was carried through the skin and subcutaneous tissue.  Hemostasis was achieved with electrocautery.  The rectus muscles were separated in the midline.  The xiphoid process was identified.  It was cartilaginous, and it was excised.  A retractor was placed under the lower portion of the sternum and upward traction was placed.  The pericardium was identified and incised.   500 mL of nonclotting, bloody fluid was evacuated.  There was no significant hemodynamic change with drainage.  An approximately 2 cm2 piece of the pericardium was resected, a portion was sent for pathology and the remainder was sent for culture.  A 28-French Blake drain was tunneled through a separate incision and placed along the diaphragmatic surface of the pericardium. Transesophageal echo showed no residual pericardial effusion.  The wound was copiously irrigated with warm saline.  The fascia was closed with a running #1 Vicryl suture.  The subcutaneous tissue and skin were closed in standard fashion.  A chest tube was placed to suction.  The patient was taken from the operating room to the postanesthetic care unit, extubated and in good condition.     Revonda Standard Roxan Hockey, M.D.     SCH/MEDQ  D:  04/29/2015  T:  04/29/2015  Job:  [id number]

## 2015-04-29 NOTE — Interval H&P Note (Signed)
History and Physical Interval Note:  04/29/2015 12:27 PM  Jeffery Crane  has presented today for surgery, with the diagnosis of PERICARDIAL EFFUSION  The various methods of treatment have been discussed with the patient and family. After consideration of risks, benefits and other options for treatment, the patient has consented to  Procedure(s): SUBXYPHOID PERICARDIAL WINDOW (N/A) TRANSESOPHAGEAL ECHOCARDIOGRAM (TEE) (N/A) as a surgical intervention .  The patient's history has been reviewed, patient examined, no change in status, stable for surgery.  I have reviewed the patient's chart and labs.  Questions were answered to the patient's satisfaction.     [name]

## 2015-04-29 NOTE — Anesthesia Procedure Notes (Addendum)
Procedure Name: Intubation Date/Time: 04/29/2015 12:47 PM Performed by: Izora Gala Pre-anesthesia Checklist: Patient identified, Emergency Drugs available, Suction available and Patient being monitored Patient Re-evaluated:Patient Re-evaluated prior to inductionOxygen Delivery Method: Circle system utilized Preoxygenation: Pre-oxygenation with 100% oxygen Intubation Type: IV induction Ventilation: Mask ventilation without difficulty Laryngoscope Size: Miller and 2 Grade View: Grade I Tube type: Oral Tube size: 7.5 mm Number of attempts: 1 Airway Equipment and Method: Stylet and LTA kit utilized Placement Confirmation: ETT inserted through vocal cords under direct vision,  positive ETCO2 and breath sounds checked- equal and bilateral Secured at: 24 cm Tube secured with: Tape Dental Injury: Teeth and Oropharynx as per pre-operative assessment

## 2015-04-29 NOTE — Transfer of Care (Signed)
Immediate Anesthesia Transfer of Care Note  Patient: Jeffery Crane  Procedure(s) Performed: Procedure(s): SUBXYPHOID PERICARDIAL WINDOW (N/A) TRANSESOPHAGEAL ECHOCARDIOGRAM (TEE) (N/A)  Patient Location: PACU  Anesthesia Type:General  Level of Consciousness: awake and patient cooperative  Airway & Oxygen Therapy: Patient Spontanous Breathing and Patient connected to nasal cannula oxygen  Post-op Assessment: Report given to RN and Post -op Vital signs reviewed and stable  Post vital signs: Reviewed and stable  Last Vitals:  Filed Vitals:   04/29/15 1215 04/29/15 1220  BP: 126/75 128/72  Pulse: 107 109  Temp:    Resp: 17 0    Last Pain:  Filed Vitals:   04/29/15 1222  PainSc: 4       Patients Stated Pain Goal: 3 ([phone])  Complications: No apparent anesthesia complications

## 2015-04-29 NOTE — Brief Op Note (Addendum)
04/29/2015  1:42 PM  PATIENT:  Jeffery Crane  20 y.o. male  PRE-OPERATIVE DIAGNOSIS:  LARGE PERICARDIAL EFFUSION  POST-OPERATIVE DIAGNOSIS: LARGE PERICARDIAL EFFUSION  PROCEDURE:  TRANSESOPHAGEAL ECHOCARDIOGRAM (TEE), SUBXYPHOID PERICARDIAL WINDOW, PERICARDIAL BIOPSY  FINDINGS: Approximately 500 cc of bloody fluid drained from the pericardium  SURGEON:  Surgeon(s) and Role:    * [name], MD - Primary  PHYSICIAN ASSISTANT: Lars Pinks PA-C  ANESTHESIA:   general  EBL:  Total I/O In: 1000 [I.V.:1000] Out: 575 [Urine:325; Blood:250]  BLOOD ADMINISTERED:none  DRAINS: (28 ) Blake drain(s) in the pericardial space   SPECIMEN:  Source of Specimen:  Pericaridal fluid and pericardial biopsy  DISPOSITION OF SPECIMEN:  Pathology and culture  COUNTS CORRECT:  YES  PLAN OF CARE: Admit to inpatient   PATIENT DISPOSITION:  PACU - hemodynamically stable.   Delay start of Pharmacological VTE agent (>24hrs) due to surgical blood loss or risk of bleeding: yes

## 2015-04-30 ENCOUNTER — Inpatient Hospital Stay (HOSPITAL_COMMUNITY): Payer: Self-pay

## 2015-04-30 ENCOUNTER — Encounter (HOSPITAL_COMMUNITY): Payer: Self-pay | Admitting: General Practice

## 2015-04-30 LAB — BASIC METABOLIC PANEL
ANION GAP: 9 (ref 5–15)
BUN: <5 mg/dL — ABNORMAL LOW (ref 6–20)
CALCIUM: 8.7 mg/dL — AB (ref 8.9–10.3)
CO2: 27 mmol/L (ref 22–32)
Chloride: 102 mmol/L (ref 101–111)
Creatinine, Ser: 0.47 mg/dL — ABNORMAL LOW (ref 0.61–1.24)
GFR calc Af Amer: >60 mL/min (ref >60)
GLUCOSE: 139 mg/dL — AB (ref 65–99)
Potassium: 3.8 mmol/L (ref 3.5–5.1)
Sodium: 138 mmol/L (ref 135–145)

## 2015-04-30 LAB — BLOOD GAS, ARTERIAL
ACID-BASE EXCESS: 2.3 mmol/L — AB (ref 0.0–2.0)
Bicarbonate: 26.3 mEq/L — ABNORMAL HIGH (ref 20.0–24.0)
DRAWN BY: 225631
O2 Content: 1 L/min
O2 SAT: 98.1 %
PATIENT TEMPERATURE: 98.6
PO2 ART: 112 mmHg — AB (ref 80.0–100.0)
TCO2: 27.5 mmol/L (ref 0–100)
pCO2 arterial: 40.3 mmHg (ref 35.0–45.0)
pH, Arterial: 7.431 (ref 7.350–7.450)

## 2015-04-30 LAB — CBC
HEMATOCRIT: 27.7 % — AB (ref 39.0–52.0)
HEMOGLOBIN: 8.1 g/dL — AB (ref 13.0–17.0)
MCH: 22.9 pg — ABNORMAL LOW (ref 26.0–34.0)
MCHC: 29.2 g/dL — ABNORMAL LOW (ref 30.0–36.0)
MCV: 78.5 fL (ref 78.0–100.0)
Platelets: 410 10*3/uL — ABNORMAL HIGH (ref 150–400)
RBC: 3.53 MIL/uL — AB (ref 4.22–5.81)
RDW: 17.1 % — ABNORMAL HIGH (ref 11.5–15.5)
WBC: 4.7 10*3/uL (ref 4.0–10.5)

## 2015-04-30 LAB — GLUCOSE, CAPILLARY: Glucose-Capillary: 138 mg/dL — ABNORMAL HIGH (ref 65–99)

## 2015-04-30 LAB — CHROMOSOME ANALYSIS, BONE MARROW

## 2015-04-30 MED ORDER — POTASSIUM CHLORIDE CRYS ER 20 MEQ PO TBCR
20.0000 meq | EXTENDED_RELEASE_TABLET | Freq: Once | ORAL | Status: AC
  Administered 2015-04-30: 20 meq via ORAL
  Filled 2015-04-30: qty 1

## 2015-04-30 MED ORDER — ENOXAPARIN SODIUM 40 MG/0.4ML ~~LOC~~ SOLN
40.0000 mg | SUBCUTANEOUS | Status: DC
  Filled 2015-04-30: qty 0.4

## 2015-04-30 NOTE — Progress Notes (Signed)
Utilization review completed.  

## 2015-04-30 NOTE — Progress Notes (Addendum)
      [address]       [place] [address]             [phone]       1 Day Post-Op Procedure(s) (LRB): SUBXYPHOID PERICARDIAL WINDOW (N/A) TRANSESOPHAGEAL ECHOCARDIOGRAM (TEE) (N/A)  Subjective: Patient asking for one of iv's to be removed. Has intermittent incisional pain.  Objective: Vital signs in last 24 hours: Temp:  [97.7 F (36.5 C)-98.5 F (36.9 C)] 98.5 F (36.9 C) (04/28 0511) Pulse Rate:  [82-125] 107 (04/28 0511) Cardiac Rhythm:  [-] Sinus tachycardia (04/27 2009) Resp:  [0-25] 18 (04/28 0511) BP: (108-141)/(64-97) 108/64 mmHg (04/28 0511) SpO2:  [99 %-100 %] 100 % (04/28 0511) Arterial Line BP: (121-186)/(72-89) 121/89 mmHg (04/27 1436) FiO2 (%):  [1 %] 1 % (04/27 1949) Weight:  [159 lb (72.122 kg)] 159 lb (72.122 kg) (04/27 0937)      Intake/Output from previous day: 04/27 0701 - 04/28 0700 In: 2020 [P.O.:120; I.V.:1900] Out: 2480 [Urine:2150; Blood:250; Chest Tube:80]   Physical Exam:  Cardiovascular: Tachycardic. Pulmonary: Clear to auscultation bilaterally; no rales, wheezes, or rhonchi. Abdomen: Soft, non tender, bowel sounds present. Extremities:Nol lower extremity edema. Wounds: Clean and dry.  No erythema or signs of infection. Chest Tube: to suction  Lab Results: CBC: Recent Labs  04/29/15 1019 04/30/15 0234  WBC 6.7 4.7  HGB 8.4* 8.1*  HCT 27.7* 27.7*  PLT 436* 410*   BMET:  Recent Labs  04/29/15 1019 04/30/15 0234  NA 141 138  K 4.1 3.8  CL 107 102  CO2 22 27  GLUCOSE 108* 139*  BUN 6 <5*  CREATININE 0.59* 0.47*  CALCIUM 8.9 8.7*    PT/INR:  Recent Labs  04/29/15 1019  LABPROT 15.2  INR 1.18   ABG:  INR: Will add last result for INR, ABG once components are confirmed Will add last 4 CBG results once components are confirmed  Assessment/Plan:  1. CV - ST in the 110's this am. 2.  Pulmonary - On 2 liters via [place]. Wean to room air tolerates. Pericardial tube with 80 cc of output since  surgery. CXR this am appears stable. Pericardial tube to remain today.Encourage incentive spirometer. 3. H and H stable at 8.1 and 27.7 4. Supplement potassium 5. Decrease IVF  ZIMMERMAN,DONIELLE MPA-C 04/30/2015,7:25 AM  Patient seen and examined, agree with above He can ambulate with the pericardial drain Path- likely Hodgkin's in pericardium Keep tube in place today Add enoxaparin to SCD for DVT prophylaxis  Remo Lipps C. Roxan Hockey, MD [facility] [phone]

## 2015-04-30 NOTE — Progress Notes (Signed)
Pt refused ambulation , pt education given , will continue to monitor.

## 2015-04-30 NOTE — Discharge Instructions (Signed)
Incision Care ° An incision (cut) is when a surgeon cuts into your body. After surgery, the cut needs to be well cared for to keep it from getting infected.  °HOW TO CARE FOR YOUR CUT °· Take medicines only as told by your doctor. °· There are many different ways to close and cover a cut, including stitches, skin glue, and adhesive strips. Follow your doctor's instructions on: °¨ Care of the cut. °¨ Bandage (dressing) changes and removal. °¨ Cut closure removal. °· Do not take baths, swim, or use a hot tub until your doctor says it is okay. You may shower as told by your doctor. °· Return to your normal diet and activities as allowed by your doctor. °· Use medicine that helps lessen itching on your cut as told by your doctor. Do not pick or scratch at your cut. °· Drink enough fluids to keep your pee (urine) clear or pale yellow. °GET HELP IF: °· You have redness, puffiness (swelling), or pain at the site of your cut. °· You have fluid, blood, or pus coming from your cut. °· Your muscles ache. °· You have chills or you feel sick. °· You have a bad smell coming from the cut or bandage. °· Your cut opens up after stitches, staples, or adhesive strips have been removed. °· You keep feeling sick to your stomach (nauseous) or keep throwing up (vomiting). °· You have a fever. °· You are dizzy. °GET HELP RIGHT AWAY IF: °· You have a rash. °· You pass out (faint). °· You have trouble breathing. °MAKE SURE YOU:  °· Understand these instructions. °· Will watch your condition. °· Will get help right away if you are not doing well or get worse. °  °This information is not intended to replace advice given to you by your health care provider. Make sure you discuss any questions you have with your health care provider. °  °Document Released: 03/13/2011 Document Revised: 01/09/2014 Document Reviewed: 02/12/2013 °Elsevier Interactive Patient Education ©2016 Elsevier Inc. ° °

## 2015-04-30 NOTE — Discharge Summary (Signed)
Physician Discharge Summary  Patient ID: Jeffery Crane MRN: [id number] DOB/AGE: 1995-01-03 20 y.o.  Admit date: 04/29/2015 Discharge date: 05/01/2015  Admission Diagnoses:  Patient Active Problem List   Diagnosis Date Noted  . Pericardial effusion with cardiac tamponade 03/30/2015  . Acute pericardial effusion 03/29/2015  . Pericardial effusion 03/29/2015  . 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 CAP (community acquired pneumonia)   . Cavitary lung disease   . Cavitary lesion of lung 01/19/2015  . Leukocytosis 01/19/2015  . Thrombocytosis ([place]) 01/19/2015  . Anemia 01/19/2015   Discharge Diagnoses:   Patient Active Problem List   Diagnosis Date Noted  . Pericardial effusion with cardiac tamponade 03/30/2015  . Acute pericardial effusion 03/29/2015  . Pericardial effusion 03/29/2015  . 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 CAP (community acquired pneumonia)   . Cavitary lung disease   . Cavitary lesion of lung 01/19/2015  . Leukocytosis 01/19/2015  . Thrombocytosis ([place]) 01/19/2015  . 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  He is currently undergoing treatment with ABVD.  Prior to starting treatment he was found to have a large Pericardial effusion.  The patient was asymptomatic and it was decided to start his treatment and see how he responds.  However, PET/CT scan was obtained and continued to show a large pericardial effusion.  The patient underwent pericardiocentesis, which was followed by some bleeding after removal of the drain.  The patient continues to remain asymptomatic despite Echocardiogram being concerning for signs of "tamponade."  He was referred to [facility] for evaluation for possible drainage of the effusion.  He was evaluated by Dr.  Roxan Hockey on 04/26/2015 at which time the patient continued to remain asymptomatic and did not show clinical signs of tamponade.  It was felt he should undergo sub xiphoid pericardial window for further treatment of his effusion.  The risks and benefits of the procedure were explained to the patient and he was agreeable to proceed.  Hospital Course:   Jeffery Crane presented to [facility] on 04/29/2015.  She was taken to the operating room and underwent Sub Xiphoid pericardial window. Approximately 500 cc of bloody fluid was drained.  The patient tolerated the procedure and was taken to the PACU in the stable condition.  The patient progressed well post operatively.  His chest tube was removed on POD #**.  His pain was well controlled.  Post operative CXR has remained stable.  He is ambulating without difficulty.  He is felt medically stable for discharge home today.     Significant Diagnostic Studies: cardiac graphics: Echocardiogram:   Left ventricle: The cavity size was normal. Wall thickness was  normal. Systolic function was normal. The estimated ejection  fraction was in the range of 60% to 65%. Wall motion was normal;  there were no regional wall motion abnormalities. - Pericardium, extracardiac: A large, free-flowing pericardial  effusion was identified circumferential to the heart. The fluid  had no internal echoes. There was right /atrial ventricular  chamber collapse. There was evidence for increased RV-LV  interaction demonstrated by respirophasic changes in transmitral  velocities. Features were consistent with impending tamponade  physiology.  Impressions:  - Pericardial effusion has reaccumulated post pericardiocentesis.  Discussed with Wyatt Mage, echosonographer, who relayed that Dr.  Meda Coffee is now evaluating patient in clinic.  Treatments: surgery:   Subxiphoid pericardial window.  Disposition: 01-Home or Self Care   Discharge medications:     Medication List    STOP taking these medications        HYDROcodone-homatropine 5-1.5 MG/5ML syrup  Commonly known as:  HYCODAN     prochlorperazine 10 MG tablet  Commonly known as:  COMPAZINE      TAKE these medications        allopurinol 100 MG tablet  Commonly known as:  ZYLOPRIM  Take 1 tablet (100 mg total) by mouth 2 (two) times daily.     lidocaine-prilocaine cream  Commonly known as:  EMLA  Apply 1 application topically as needed.     oxyCODONE 5 MG immediate release tablet  Commonly known as:  Oxy IR/ROXICODONE  Take 1-2 tablets (5-10 mg total) by mouth every 4 (four) hours as needed for severe pain.       Follow-up Information    Follow up with [name], MD On 05/18/2015.   Specialty:  Cardiothoracic Surgery   Why:  Appointment is at 1:00   Contact information:   [place] [address] [phone]       Follow up with [facility] On 05/18/2015.   Why:  Please get CXR at 12:30   Contact information:   [facility]       Follow up with [facility].   Specialty:  Cardiothoracic Surgery   Why:  nurse appt for suture removal- office will contact you   Contact information:   [address] [place], [place] [phone]      Signed: John Giovanni 05/01/2015, 11:47 AM

## 2015-05-01 ENCOUNTER — Inpatient Hospital Stay (HOSPITAL_COMMUNITY): Payer: Self-pay

## 2015-05-01 LAB — COMPREHENSIVE METABOLIC PANEL
ALK PHOS: 151 U/L — AB (ref 38–126)
ALT: 21 U/L (ref 17–63)
ANION GAP: 9 (ref 5–15)
AST: 11 U/L — ABNORMAL LOW (ref 15–41)
Albumin: 2.3 g/dL — ABNORMAL LOW (ref 3.5–5.0)
BILIRUBIN TOTAL: 0.2 mg/dL — AB (ref 0.3–1.2)
BUN: <5 mg/dL — ABNORMAL LOW (ref 6–20)
CALCIUM: 9.1 mg/dL (ref 8.9–10.3)
CO2: 27 mmol/L (ref 22–32)
Chloride: 104 mmol/L (ref 101–111)
Creatinine, Ser: 0.58 mg/dL — ABNORMAL LOW (ref 0.61–1.24)
GFR calc Af Amer: >60 mL/min (ref >60)
Glucose, Bld: 104 mg/dL — ABNORMAL HIGH (ref 65–99)
POTASSIUM: 4.4 mmol/L (ref 3.5–5.1)
Sodium: 140 mmol/L (ref 135–145)
TOTAL PROTEIN: 6.5 g/dL (ref 6.5–8.1)

## 2015-05-01 LAB — CBC
HEMATOCRIT: 30.5 % — AB (ref 39.0–52.0)
Hemoglobin: 9.1 g/dL — ABNORMAL LOW (ref 13.0–17.0)
MCH: 24 pg — AB (ref 26.0–34.0)
MCHC: 29.8 g/dL — AB (ref 30.0–36.0)
MCV: 80.5 fL (ref 78.0–100.0)
Platelets: 377 10*3/uL (ref 150–400)
RBC: 3.79 MIL/uL — ABNORMAL LOW (ref 4.22–5.81)
RDW: 17.6 % — AB (ref 11.5–15.5)
WBC: 3.8 10*3/uL — ABNORMAL LOW (ref 4.0–10.5)

## 2015-05-01 MED ORDER — OXYCODONE HCL 5 MG PO TABS: 5.0000 mg | ORAL_TABLET | ORAL | Status: DC | PRN

## 2015-05-01 NOTE — Progress Notes (Signed)
Order received to discharge.  Telemetry removed and CCMD notified.  IV removed with catheter intact.  Discharge education given to Pt including handout on pericardial effusion and incision care.  Pt indicates understanding.  Pt denies chest pain or sob.  Pt stable to discharge.

## 2015-05-01 NOTE — Progress Notes (Addendum)
[address]       [place] [address]             [phone]      2 Days Post-Op Procedure(s) (LRB): SUBXYPHOID PERICARDIAL WINDOW (N/A) TRANSESOPHAGEAL ECHOCARDIOGRAM (TEE) (N/A) Subjective: Feels ok  Objective: Vital signs in last 24 hours: Temp:  [97.8 F (36.6 C)-98.4 F (36.9 C)] 97.8 F (36.6 C) (04/29 0547) Pulse Rate:  [88-123] 88 (04/29 0547) Cardiac Rhythm:  [-] Sinus tachycardia (04/28 1900) Resp:  [18-22] 18 (04/29 0547) BP: (108-126)/(66-76) 112/66 mmHg (04/29 0547) SpO2:  [98 %-100 %] 99 % (04/29 0547)  Hemodynamic parameters for last 24 hours:    Intake/Output from previous day: 04/28 0701 - 04/29 0700 In: 480 [P.O.:480] Out: 1602 [Urine:1550; Chest Tube:52] Intake/Output this shift:    General appearance: alert, cooperative and no distress Heart: regular rate and rhythm and tachy Lungs: clear to auscultation bilaterally Abdomen: benign Extremities: no edema Wound: incis healing well  Lab Results:  Recent Labs  04/30/15 0234 05/01/15 0300  WBC 4.7 3.8*  HGB 8.1* 9.1*  HCT 27.7* 30.5*  PLT 410* 377   BMET:  Recent Labs  04/30/15 0234 05/01/15 0300  NA 138 140  K 3.8 4.4  CL 102 104  CO2 27 27  GLUCOSE 139* 104*  BUN <5* <5*  CREATININE 0.47* 0.58*  CALCIUM 8.7* 9.1    PT/INR:  Recent Labs  04/29/15 1019  LABPROT 15.2  INR 1.18   ABG    Component Value Date/Time   PHART 7.431 04/30/2015 0450   HCO3 26.3* 04/30/2015 0450   TCO2 27.5 04/30/2015 0450   O2SAT 98.1 04/30/2015 0450   CBG (last 3)   Recent Labs  04/29/15 1645 04/29/15 2322 04/30/15 0602  GLUCAP 138* 109* 138*    Meds Scheduled Meds: . acetaminophen  1,000 mg Oral Q6H   Or  . acetaminophen (TYLENOL) oral liquid 160 mg/5 mL  1,000 mg Oral Q6H  . allopurinol  100 mg Oral BID  . bisacodyl  10 mg Oral Daily  . enoxaparin (LOVENOX) injection  40 mg Subcutaneous Q24H  . fentaNYL   Intravenous Q4H  . pantoprazole  40 mg Oral  Daily  . senna-docusate  1 tablet Oral QHS   Continuous Infusions: . dextrose 5 % and 0.45 % NaCl with KCl 10 mEq/L 10 mL/hr at 04/30/15 0801   PRN Meds:.diphenhydrAMINE **OR** diphenhydrAMINE, lidocaine-prilocaine, naloxone **AND** sodium chloride flush, ondansetron (ZOFRAN) IV, oxyCODONE, potassium chloride, traMADol  Xrays Dg Chest 2 View  04/29/2015  CLINICAL DATA:  Pericardial effusion. EXAM: CHEST  2 VIEW COMPARISON:  March 15, 2015.  PET scan of March 30, 2015. FINDINGS: Mild cardiomegaly is noted consistent with pericardial effusion as described on prior PET scan. No pneumothorax or pleural effusion is noted. Interval placement of right subclavian Port-A-Cath with distal tip in expected position of the SVC. Stable right upper lobe opacity is noted. Left lung is clear. Bony thorax is unremarkable. IMPRESSION: Interval placement of right subclavian Port-A-Cath. Stable right upper lobe opacity is noted. Electronically Signed   By: Marijo Conception, M.D.   On: 04/29/2015 10:48   Dg Chest Port 1 View  04/30/2015  CLINICAL DATA:  Pneumothorax.  Pericardial drain in place. EXAM: PORTABLE CHEST 1 VIEW COMPARISON:  04/29/2015 FINDINGS: Power port in place. Pericardial drain in place. Increased density at the left lung base with a walls of the silhouette of the left hemi diaphragm is consistent with  a small left pleural effusion and/or atelectasis/ consolidation in the left lower lobe. Right hilar lung mass is again noted. IMPRESSION: No pneumothorax. New increased density at the left lung base consistent with left effusion or atelectasis/ consolidation at the left lung base. Electronically Signed   By: Lorriane Shire M.D.   On: 04/30/2015 07:51   Dg Chest Port 1 View  04/29/2015  CLINICAL DATA:  Post pericardial window. Rule out pneumothorax. Chest pain EXAM: PORTABLE CHEST 1 VIEW COMPARISON:  04/29/2015 FINDINGS: Negative for pneumothorax Right upper lobe cavitary mass unchanged.  No pleural effusion.  Interval placement of pericardial catheter. Port-A-Cath tip in the lower SVC unchanged. IMPRESSION: Interval placement of pericardial catheter.  No pneumothorax Right upper lobe cavitary mass unchanged. Electronically Signed   By: Franchot Gallo M.D.   On: 04/29/2015 14:41    Assessment/Plan: S/P Procedure(s) (LRB): SUBXYPHOID PERICARDIAL WINDOW (N/A) TRANSESOPHAGEAL ECHOCARDIOGRAM (TEE) (N/A)  1 no recent drainage- d/c tube and probable discharge home later today 2 sats good on RA   LOS: 2 days    GOLD,WAYNE E 05/01/2015  Patient seen and examined, agree with above  Remo Lipps C. Roxan Hockey, MD [facility] [phone]

## 2015-05-01 NOTE — Progress Notes (Signed)
Chest tube removed per order.  Pt tol well.  Chest tube intact upon removal.  Site covered with gauze impregnated dressing.  Pt denies pain.  Will cont to monitor.

## 2015-05-02 LAB — TISSUE CULTURE: Culture: NO GROWTH

## 2015-05-03 ENCOUNTER — Other Ambulatory Visit: Payer: Self-pay | Admitting: *Deleted

## 2015-05-03 DIAGNOSIS — C8112 Nodular sclerosis classical Hodgkin lymphoma, intrathoracic lymph nodes: Secondary | ICD-10-CM

## 2015-05-04 ENCOUNTER — Telehealth: Payer: Self-pay | Admitting: Internal Medicine

## 2015-05-04 ENCOUNTER — Telehealth: Payer: Self-pay | Admitting: *Deleted

## 2015-05-04 ENCOUNTER — Other Ambulatory Visit (HOSPITAL_BASED_OUTPATIENT_CLINIC_OR_DEPARTMENT_OTHER): Payer: Self-pay

## 2015-05-04 ENCOUNTER — Ambulatory Visit (HOSPITAL_BASED_OUTPATIENT_CLINIC_OR_DEPARTMENT_OTHER): Payer: Self-pay | Admitting: Internal Medicine

## 2015-05-04 ENCOUNTER — Encounter: Payer: Self-pay | Admitting: Internal Medicine

## 2015-05-04 ENCOUNTER — Ambulatory Visit (HOSPITAL_BASED_OUTPATIENT_CLINIC_OR_DEPARTMENT_OTHER): Payer: Self-pay

## 2015-05-04 VITALS — BP 136/82 | HR 104 | Temp 98.5°F | Resp 18 | Ht 70.0 in | Wt 160.2 lb

## 2015-05-04 DIAGNOSIS — D630 Anemia in neoplastic disease: Secondary | ICD-10-CM | POA: Insufficient documentation

## 2015-05-04 DIAGNOSIS — G8918 Other acute postprocedural pain: Secondary | ICD-10-CM

## 2015-05-04 DIAGNOSIS — C8112 Nodular sclerosis classical Hodgkin lymphoma, intrathoracic lymph nodes: Secondary | ICD-10-CM

## 2015-05-04 DIAGNOSIS — Z5111 Encounter for antineoplastic chemotherapy: Secondary | ICD-10-CM | POA: Insufficient documentation

## 2015-05-04 HISTORY — DX: Encounter for antineoplastic chemotherapy: Z51.11

## 2015-05-04 LAB — COMPREHENSIVE METABOLIC PANEL
ALK PHOS: 152 U/L — AB (ref 40–150)
ALT: 22 U/L (ref 0–55)
AST: 17 U/L (ref 5–34)
Albumin: 2.7 g/dL — ABNORMAL LOW (ref 3.5–5.0)
Anion Gap: 9 mEq/L (ref 3–11)
BUN: 7.1 mg/dL (ref 7.0–26.0)
CO2: 28 mEq/L (ref 22–29)
CREATININE: 0.7 mg/dL (ref 0.7–1.3)
Calcium: 9.5 mg/dL (ref 8.4–10.4)
Chloride: 108 mEq/L (ref 98–109)
GLUCOSE: 95 mg/dL (ref 70–140)
Potassium: 3.8 mEq/L (ref 3.5–5.1)
Sodium: 144 mEq/L (ref 136–145)
TOTAL PROTEIN: 7.2 g/dL (ref 6.4–8.3)

## 2015-05-04 LAB — CBC WITH DIFFERENTIAL/PLATELET
BASO%: 2.9 % — AB (ref 0.0–2.0)
Basophils Absolute: 0.1 10*3/uL (ref 0.0–0.1)
EOS ABS: 0.1 10*3/uL (ref 0.0–0.5)
EOS%: 3.7 % (ref 0.0–7.0)
HCT: 32.5 % — ABNORMAL LOW (ref 38.4–49.9)
HGB: 9.9 g/dL — ABNORMAL LOW (ref 13.0–17.1)
LYMPH%: 34.8 % (ref 14.0–49.0)
MCH: 23.4 pg — ABNORMAL LOW (ref 27.2–33.4)
MCHC: 30.6 g/dL — AB (ref 32.0–36.0)
MCV: 76.7 fL — AB (ref 79.3–98.0)
MONO#: 0.4 10*3/uL (ref 0.1–0.9)
MONO%: 13.4 % (ref 0.0–14.0)
NEUT#: 1.4 10*3/uL — ABNORMAL LOW (ref 1.5–6.5)
NEUT%: 45.2 % (ref 39.0–75.0)
PLATELETS: 544 10*3/uL — AB (ref 140–400)
RBC: 4.24 10*6/uL (ref 4.20–5.82)
RDW: 19.8 % — ABNORMAL HIGH (ref 11.0–14.6)
WBC: 3.1 10*3/uL — ABNORMAL LOW (ref 4.0–10.3)
lymph#: 1.1 10*3/uL (ref 0.9–3.3)

## 2015-05-04 MED ORDER — SODIUM CHLORIDE 0.9 % IV SOLN
5.7000 mg/m2 | Freq: Once | INTRAVENOUS | Status: AC
  Administered 2015-05-04: 11 mg via INTRAVENOUS
  Filled 2015-05-04: qty 11

## 2015-05-04 MED ORDER — SODIUM CHLORIDE 0.9% FLUSH
10.0000 mL | INTRAVENOUS | Status: DC | PRN
  Administered 2015-05-04: 10 mL
  Filled 2015-05-04: qty 10

## 2015-05-04 MED ORDER — SODIUM CHLORIDE 0.9 % IV SOLN
Freq: Once | INTRAVENOUS | Status: AC
  Administered 2015-05-04: 13:00:00 via INTRAVENOUS
  Filled 2015-05-04: qty 5

## 2015-05-04 MED ORDER — DOXORUBICIN HCL CHEMO IV INJECTION 2 MG/ML
25.0000 mg/m2 | Freq: Once | INTRAVENOUS | Status: AC
  Administered 2015-05-04: 48 mg via INTRAVENOUS
  Filled 2015-05-04: qty 24

## 2015-05-04 MED ORDER — PALONOSETRON HCL INJECTION 0.25 MG/5ML
INTRAVENOUS | Status: AC
  Filled 2015-05-04: qty 5

## 2015-05-04 MED ORDER — SODIUM CHLORIDE 0.9 % IV SOLN
375.0000 mg/m2 | Freq: Once | INTRAVENOUS | Status: AC
  Administered 2015-05-04: 720 mg via INTRAVENOUS
  Filled 2015-05-04: qty 36

## 2015-05-04 MED ORDER — PALONOSETRON HCL INJECTION 0.25 MG/5ML
0.2500 mg | Freq: Once | INTRAVENOUS | Status: AC
  Administered 2015-05-04: 0.25 mg via INTRAVENOUS

## 2015-05-04 MED ORDER — BLEOMYCIN SULFATE CHEMO INJECTION 30 UNIT
10.0000 [IU]/m2 | Freq: Once | INTRAMUSCULAR | Status: AC
  Administered 2015-05-04: 19 [IU] via INTRAVENOUS
  Filled 2015-05-04: qty 6.33

## 2015-05-04 MED ORDER — SODIUM CHLORIDE 0.9 % IV SOLN
Freq: Once | INTRAVENOUS | Status: AC
  Administered 2015-05-04: 13:00:00 via INTRAVENOUS

## 2015-05-04 MED ORDER — HEPARIN SOD (PORK) LOCK FLUSH 100 UNIT/ML IV SOLN
500.0000 [IU] | Freq: Once | INTRAVENOUS | Status: AC | PRN
  Administered 2015-05-04: 500 [IU]
  Filled 2015-05-04: qty 5

## 2015-05-04 NOTE — Progress Notes (Signed)
Verbal consent  Given by Dr. Julien Nordmann to treat with with Abs Neut at 1.4 today

## 2015-05-04 NOTE — Telephone Encounter (Signed)
reply from Dr Mohamed to doule book-sent MW email to adv Dr Mohamend wants that ppt trmt on same day °

## 2015-05-04 NOTE — Progress Notes (Signed)
[place] [phone]   [phone]  OFFICE PROGRESS NOTE  [facility] [place] [address]  DIAGNOSIS: Stage III nodular sclerosing Hodgkin lymphoma presented with cavitary masses in the right lung in addition to mediastinal, supraclavicular, axillary as well as splenic involvement diagnosed in March 2017  PRIOR THERAPY:  1) Status post pericardiocentesis with drainage of pericardial fluid secondary to Hodgkin's lymphoma. Cytology was negative for lymphoma. 2) status post subxiphoid pericardial window under the care of Dr. Roxan Hockey on 04/29/2015  CURRENT THERAPY: Systemic chemotherapy with ABVD. First dose 04/20/2015. Status post day 1 of cycle #1.  INTERVAL HISTORY: Jeffery Crane 20 y.o. male returns to the clinic today for follow-up visit accompanied by several family members. The patient has no complaints today except for pain from the recent pericardial window incision. He underwent Pericardial window on 04/29/2015 with drainage of 500 ML of bloody fluid. He tolerated the first cycle of his systemic chemotherapy fairly well was no significant complaints except for mild nausea. He noticed improvement in the right cervical and supraclavicular lymph nodes after the first cycle of his treatment.  He denied having any significant fever or chills. He denied having any nausea or vomiting. He denied having any significant shortness breath and has mild cough with no hemoptysis. He is here today to start day 15 of cycle #1.  MEDICAL HISTORY: Past Medical History  Diagnosis Date  . Adult ADHD   . Pneumonia   . Headache   . GERD (gastroesophageal reflux disease)   . 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 Anemia     ALLERGIES:  is allergic to amoxicillin-pot clavulanate and bactrim.  MEDICATIONS:  Current Outpatient Prescriptions  Medication Sig Dispense  Refill  . allopurinol (ZYLOPRIM) 100 MG tablet Take 1 tablet (100 mg total) by mouth 2 (two) times daily. 60 tablet 3  . HYDROMET 5-1.5 MG/5ML syrup TK 5 ML PO Q 6 H PRN COU  0  . lidocaine-prilocaine (EMLA) cream Apply 1 application topically as needed. 30 g 0  . oxyCODONE (OXY IR/ROXICODONE) 5 MG immediate release tablet Take 1-2 tablets (5-10 mg total) by mouth every 4 (four) hours as needed for severe pain. 30 tablet 0  . prochlorperazine (COMPAZINE) 10 MG tablet TAKE 1 TABLET BY MOUTH EVERY 6 HOURS AS NEEDED FOR NAUSEA OR VOMITING  0   No current facility-administered medications for this visit.    SURGICAL HISTORY:  Past Surgical History  Procedure Laterality Date  . Video bronchoscopy Bilateral 01/22/2015    Procedure: VIDEO BRONCHOSCOPY WITH FLUORO;  Surgeon: Marshell Garfinkel, MD;  Location: [facility];  Service: Cardiopulmonary;  Laterality: Bilateral;  . Supraclavical node biopsy Right 03/15/2015    Procedure: RIGHT SUPRACLAVICAL/CERVICAL LYMPH NODE BIOPSY;  Surgeon: [name], MD;  Location: [facility];  Service: Thoracic;  Laterality: Right;  . [place] node biopsy Right 03/15/2015    Procedure: BIOPSY SCALENE NODE;  Surgeon: [name], MD;  Location: [facility];  Service: Thoracic;  Laterality: Right;  . Cardiac catheterization N/A 04/01/2015    Procedure: Pericardiocentesis;  Surgeon: Sherren Mocha, MD;  Location: [facility];  Service: Cardiovascular;  Laterality: N/A;  . Subxyphoid pericardial window  04/29/2015  . Subxyphoid pericardial window N/A 04/29/2015    Procedure: SUBXYPHOID PERICARDIAL WINDOW;  Surgeon: [name], MD;  Location: [place];  Service: Thoracic;  Laterality: N/A;  .  Tee without cardioversion N/A 04/29/2015    Procedure: TRANSESOPHAGEAL ECHOCARDIOGRAM (TEE);  Surgeon: [name], MD;  Location: [facility];  Service: Thoracic;  Laterality: N/A;    REVIEW OF SYSTEMS:  A comprehensive review of systems was negative except for:  Constitutional: positive for fatigue Respiratory: positive for pleurisy/chest pain   PHYSICAL EXAMINATION: General appearance: alert, cooperative, fatigued and no distress Head: Normocephalic, without obvious abnormality, atraumatic Neck: marked anterior cervical adenopathy and thyroid not enlarged, symmetric, no tenderness/mass/nodules Lymph nodes: Large right cervical and supraclavicular as well as a right axillary lymphadenopathy Resp: wheezes RUL Back: symmetric, no curvature. ROM normal. No CVA tenderness. Cardio: regular rate and rhythm, S1, S2 normal, no murmur, click, rub or gallop GI: soft, non-tender; bowel sounds normal; no masses,  no organomegaly Extremities: extremities normal, atraumatic, no cyanosis or edema Neurologic: Alert and oriented X 3, normal strength and tone. Normal symmetric reflexes. Normal coordination and gait  ECOG PERFORMANCE STATUS: 1 - Symptomatic but completely ambulatory  Blood pressure 136/82, pulse 104, temperature 98.5 F (36.9 C), temperature source Oral, resp. rate 18, height 5\' 10"  (1.778 m), weight 160 lb 3.2 oz (72.666 kg), SpO2 100 %.  LABORATORY DATA: Lab Results  Component Value Date   WBC 3.1* 05/04/2015   HGB 9.9* 05/04/2015   HCT 32.5* 05/04/2015   MCV 76.7* 05/04/2015   PLT 544* 05/04/2015      Chemistry      Component Value Date/Time   NA 144 05/04/2015 1054   NA 140 05/01/2015 0300   NA 138 12/18/2013 0848   K 3.8 05/04/2015 1054   K 4.4 05/01/2015 0300   K 4.0 12/18/2013 0848   CL 104 05/01/2015 0300   CL 105 12/18/2013 0848   CO2 28 05/04/2015 1054   CO2 27 05/01/2015 0300   CO2 28 12/18/2013 0848   BUN 7.1 05/04/2015 1054   BUN <5* 05/01/2015 0300   BUN 7 12/18/2013 0848   CREATININE 0.7 05/04/2015 1054   CREATININE 0.58* 05/01/2015 0300   CREATININE 0.93 12/18/2013 0848      Component Value Date/Time   CALCIUM 9.5 05/04/2015 1054   CALCIUM 9.1 05/01/2015 0300   CALCIUM 9.0 12/18/2013 0848   ALKPHOS 152*  05/04/2015 1054   ALKPHOS 151* 05/01/2015 0300   AST 17 05/04/2015 1054   AST 11* 05/01/2015 0300   ALT 22 05/04/2015 1054   ALT 21 05/01/2015 0300   BILITOT <0.30 05/04/2015 1054   BILITOT 0.2* 05/01/2015 0300       RADIOGRAPHIC STUDIES: Dg Chest 2 View  05/01/2015  CLINICAL DATA:  20 year old male with admission 04/26/2015 and history of Hodgkin's lymphoma EXAM: CHEST - 2 VIEW COMPARISON:  05/01/2015, PET-CT 03/31/2015 FINDINGS: Cardiomediastinal silhouette unchanged. Opacity overlying the right hilar region is unchanged. Blunting of the costophrenic sulcus on the lateral view. Cardiomediastinal silhouette is unchanged from the comparison, with interval removal of the mediastinal drain. No pneumothorax. Unchanged position of right IJ approach port catheter. No confluent airspace disease. Unremarkable appearance of the upper abdomen. IMPRESSION: Interval removal of mediastinal drain with unchanged configuration of the cardiomediastinal silhouette. Evidence of small pleural effusion on the lateral view. Similar appearance of opacity at the right hilar region, corresponding to known cavitary lesion/mediastinal nodes, better characterized on prior CT. Unchanged port catheter. Signed, Dulcy Fanny. Earleen Newport, DO [facility] Electronically Signed   By: Corrie Mckusick D.O.   On: 05/01/2015 11:55   Dg Chest 2 View  04/29/2015  CLINICAL DATA:  Pericardial effusion. EXAM: CHEST  2 VIEW COMPARISON:  March 15, 2015.  PET scan of March 30, 2015. FINDINGS: Mild cardiomegaly is noted consistent with pericardial effusion as described on prior PET scan. No pneumothorax or pleural effusion is noted. Interval placement of right subclavian Port-A-Cath with distal tip in expected position of the SVC. Stable right upper lobe opacity is noted. Left lung is clear. Bony thorax is unremarkable. IMPRESSION: Interval placement of right subclavian Port-A-Cath. Stable right  upper lobe opacity is noted. Electronically Signed   By: Marijo Conception, M.D.   On: 04/29/2015 10:48   Ir Fluoro Guide Cv Line Right  04/16/2015  CLINICAL DATA:  NODULAR SCLEROSING HODGKIN'S LYMPHOMA EXAM: RIGHT INTERNAL JUGULAR SINGLE LUMEN POWER PORT CATHETER INSERTION Date:  4/14/20174/14/2017 10:33 am Radiologist:  M. Daryll Brod, MD Guidance:  Ultrasound and fluoroscopic MEDICATIONS: Vancomycin 1 gm IV; The antibiotic was administered within an appropriate time interval prior to skin puncture. ANESTHESIA/SEDATION: Versed 3.5 mg IV; Fentanyl 100 mcg IV; Moderate Sedation Time:  31 The patient was continuously monitored during the procedure by the interventional radiology nurse under my direct supervision. FLUOROSCOPY TIME:  42 seconds (7 mGy) COMPLICATIONS: None immediate. CONTRAST:  None. PROCEDURE: Informed consent was obtained from the patient following explanation of the procedure, risks, benefits and alternatives. The patient understands, agrees and consents for the procedure. All questions were addressed. A time out was performed. Maximal barrier sterile technique utilized including caps, mask, sterile gowns, sterile gloves, large sterile drape, hand hygiene, and 2% chlorhexidine scrub. Under sterile conditions and local anesthesia, right internal jugular micropuncture venous access was performed. Access was performed with ultrasound. Images were obtained for documentation. A guide wire was inserted followed by a transitional dilator. This allowed insertion of a guide wire and catheter into the IVC. Measurements were obtained from the SVC / RA junction back to the right IJ venotomy site. In the right infraclavicular chest, a subcutaneous pocket was created over the second anterior rib. This was done under sterile conditions and local anesthesia. 1% lidocaine with epinephrine was utilized for this. A 2.5 cm incision was made in the skin. Blunt dissection was performed to create a subcutaneous pocket  over the right pectoralis major muscle. The pocket was flushed with saline vigorously. There was adequate hemostasis. The port catheter was assembled and checked for leakage. The port catheter was secured in the pocket with two retention sutures. The tubing was tunneled subcutaneously to the right venotomy site and inserted into the SVC/RA junction through a valved peel-away sheath. Position was confirmed with fluoroscopy. Images were obtained for documentation. The patient tolerated the procedure well. No immediate complications. Incisions were closed in a two layer fashion with 4 - 0 Vicryl suture. Dermabond was applied to the skin. The port catheter was accessed, blood was aspirated followed by saline and heparin flushes. Needle was removed. A dry sterile dressing was applied. IMPRESSION: Ultrasound and fluoroscopically guided right internal jugular single lumen power port catheter insertion. Tip in the SVC/RA junction. Catheter ready for use. Electronically Signed   By: Jerilynn Mages.  Shick M.D.   On: 04/16/2015 10:52   Ir US Guide Vasc Access Right  04/16/2015  CLINICAL DATA:  NODULAR SCLEROSING HODGKIN'S LYMPHOMA EXAM: RIGHT INTERNAL JUGULAR SINGLE LUMEN POWER PORT CATHETER INSERTION Date:  4/14/20174/14/2017 10:33 am Radiologist:  M. Daryll Brod, MD Guidance:  Ultrasound and fluoroscopic MEDICATIONS: Vancomycin 1 gm IV; The antibiotic was administered within an appropriate time interval prior to skin puncture. ANESTHESIA/SEDATION:  Versed 3.5 mg IV; Fentanyl 100 mcg IV; Moderate Sedation Time:  31 The patient was continuously monitored during the procedure by the interventional radiology nurse under my direct supervision. FLUOROSCOPY TIME:  42 seconds (7 mGy) COMPLICATIONS: None immediate. CONTRAST:  None. PROCEDURE: Informed consent was obtained from the patient following explanation of the procedure, risks, benefits and alternatives. The patient understands, agrees and consents for the procedure. All questions were  addressed. A time out was performed. Maximal barrier sterile technique utilized including caps, mask, sterile gowns, sterile gloves, large sterile drape, hand hygiene, and 2% chlorhexidine scrub. Under sterile conditions and local anesthesia, right internal jugular micropuncture venous access was performed. Access was performed with ultrasound. Images were obtained for documentation. A guide wire was inserted followed by a transitional dilator. This allowed insertion of a guide wire and catheter into the IVC. Measurements were obtained from the SVC / RA junction back to the right IJ venotomy site. In the right infraclavicular chest, a subcutaneous pocket was created over the second anterior rib. This was done under sterile conditions and local anesthesia. 1% lidocaine with epinephrine was utilized for this. A 2.5 cm incision was made in the skin. Blunt dissection was performed to create a subcutaneous pocket over the right pectoralis major muscle. The pocket was flushed with saline vigorously. There was adequate hemostasis. The port catheter was assembled and checked for leakage. The port catheter was secured in the pocket with two retention sutures. The tubing was tunneled subcutaneously to the right venotomy site and inserted into the SVC/RA junction through a valved peel-away sheath. Position was confirmed with fluoroscopy. Images were obtained for documentation. The patient tolerated the procedure well. No immediate complications. Incisions were closed in a two layer fashion with 4 - 0 Vicryl suture. Dermabond was applied to the skin. The port catheter was accessed, blood was aspirated followed by saline and heparin flushes. Needle was removed. A dry sterile dressing was applied. IMPRESSION: Ultrasound and fluoroscopically guided right internal jugular single lumen power port catheter insertion. Tip in the SVC/RA junction. Catheter ready for use. Electronically Signed   By: Jerilynn Mages.  Shick M.D.   On: 04/16/2015 10:52     Ct Biopsy  04/16/2015  INDICATION: Nodular sclerosing Hodgkin's lymphoma EXAM: CT GUIDED RIGHT ILIAC BONE MARROW ASPIRATION AND CORE BIOPSY Date:  4/14/20174/14/2017 9:56 am Radiologist:  M. Daryll Brod, MD Guidance:  CT FLUOROSCOPY TIME:  Fluoroscopy Time: NONE. MEDICATIONS: 1% lidocaine locally ANESTHESIA/SEDATION: 2.5 mg IV Versed; 100 mcg IV Fentanyl Moderate Sedation Time:  10 The patient was continuously monitored during the procedure by the interventional radiology nurse under my direct supervision. CONTRAST:  None. COMPLICATIONS: None PROCEDURE: Informed consent was obtained from the patient following explanation of the procedure, risks, benefits and alternatives. The patient understands, agrees and consents for the procedure. All questions were addressed. A time out was performed. The patient was positioned prone and non-contrast localization CT was performed of the pelvis to demonstrate the iliac marrow spaces. Maximal barrier sterile technique utilized including caps, mask, sterile gowns, sterile gloves, large sterile drape, hand hygiene, and Betadine prep. Under sterile conditions and local anesthesia, an 11 gauge coaxial bone biopsy needle was advanced into the right iliac marrow space. Needle position was confirmed with CT imaging. Initially, bone marrow aspiration was performed. Next, the 11 gauge outer cannula was utilized to obtain a right iliac bone marrow core biopsy. Needle was removed. Hemostasis was obtained with compression. The patient tolerated the procedure well. Samples were prepared with the  cytotechnologist. No immediate complications. IMPRESSION: CT guided right iliac bone marrow aspiration and core biopsy. Electronically Signed   By: Jerilynn Mages.  Shick M.D.   On: 04/16/2015 10:53   Dg Chest Port 1 View  05/01/2015  CLINICAL DATA:  Patient with history of pneumothorax. EXAM: PORTABLE CHEST 1 VIEW COMPARISON:  Chest radiograph 04/30/2015. FINDINGS: Right chest wall Port-A-Cath is present  with tip projecting over the superior vena cava, stable. Stable cardiac and mediastinal contours. Unchanged right hilar masslike opacity. Minimal linear opacities left lung base. No pleural effusion or pneumothorax. IMPRESSION: Left basilar atelectasis. Electronically Signed   By: Lovey Newcomer M.D.   On: 05/01/2015 08:38   Dg Chest Port 1 View  04/30/2015  CLINICAL DATA:  Pneumothorax.  Pericardial drain in place. EXAM: PORTABLE CHEST 1 VIEW COMPARISON:  04/29/2015 FINDINGS: Power port in place. Pericardial drain in place. Increased density at the left lung base with a walls of the silhouette of the left hemi diaphragm is consistent with a small left pleural effusion and/or atelectasis/ consolidation in the left lower lobe. Right hilar lung mass is again noted. IMPRESSION: No pneumothorax. New increased density at the left lung base consistent with left effusion or atelectasis/ consolidation at the left lung base. Electronically Signed   By: Lorriane Shire M.D.   On: 04/30/2015 07:51   Dg Chest Port 1 View  04/29/2015  CLINICAL DATA:  Post pericardial window. Rule out pneumothorax. Chest pain EXAM: PORTABLE CHEST 1 VIEW COMPARISON:  04/29/2015 FINDINGS: Negative for pneumothorax Right upper lobe cavitary mass unchanged.  No pleural effusion. Interval placement of pericardial catheter. Port-A-Cath tip in the lower SVC unchanged. IMPRESSION: Interval placement of pericardial catheter.  No pneumothorax Right upper lobe cavitary mass unchanged. Electronically Signed   By: Franchot Gallo M.D.   On: 04/29/2015 14:41    ASSESSMENT AND PLAN: This is a very pleasant 20 years old African-American male recently diagnosed with probably stage III nodular sclerosing Hodgkin lymphoma with bulky lymphadenopathy in the right lower cervical, right supraclavicular, right axillary as well as mediastinal lymph nodes in addition to cavitary lung masses and splenic involvement diagnosed in March 2017. He is currently on systemic  chemotherapy with ABVD status post day 1 of cycle #1. He tolerated the treatment well. I recommended for him to proceed with day 15 of cycle #1 today as a scheduled. He would come back for follow-up visit in 2 weeks for reevaluation and management of any adverse effect of his treatment. He was advised to call immediately if he has any concerning symptoms in the interval. The patient voices understanding of current disease status and treatment options and is in agreement with the current care plan.  All questions were answered. The patient knows to call the clinic with any problems, questions or concerns. We can certainly see the patient much sooner if necessary.  Disclaimer: This note was dictated with voice recognition software. Similar sounding words can inadvertently be transcribed and may not be corrected upon review.

## 2015-05-04 NOTE — Patient Instructions (Signed)
PICK UP EMLA CREAM FROM [facility] - PUT ON SO IT IS ON FOR 1.5 TO 2 HOURS PRIOR TO NEEDING TO BE USED. Doxorubicin injection What is this medicine? DOXORUBICIN (dox oh ROO bi sin) is a chemotherapy drug. It is used to treat many kinds of cancer like Hodgkin's disease, leukemia, non-Hodgkin's lymphoma, neuroblastoma, sarcoma, and Wilms' tumor. It is also used to treat bladder cancer, breast cancer, lung cancer, ovarian cancer, stomach cancer, and thyroid cancer. This medicine may be used for other purposes; ask your health care provider or pharmacist if you have questions. What should I tell my health care provider before I take this medicine? They need to know if you have any of these conditions: -blood disorders -heart disease, recent heart attack -infection (especially a virus infection such as chickenpox, cold sores, or herpes) -irregular heartbeat -liver disease -recent or ongoing radiation therapy -an unusual or allergic reaction to doxorubicin, other chemotherapy agents, other medicines, foods, dyes, or preservatives -pregnant or trying to get pregnant -breast-feeding How should I use this medicine? This drug is given as an infusion into a vein. It is administered in a hospital or clinic by a specially trained health care professional. If you have pain, swelling, burning or any unusual feeling around the site of your injection, tell your health care professional right away. Talk to your pediatrician regarding the use of this medicine in children. Special care may be needed. Overdosage: If you think you have taken too much of this medicine contact a poison control center or emergency room at once. NOTE: This medicine is only for you. Do not share this medicine with others. What if I miss a dose? It is important not to miss your dose. Call your doctor or health care professional if you are unable to keep an appointment. What may interact with this medicine? Do not take this medicine with any of  the following medications: -cisapride -droperidol -halofantrine -pimozide -zidovudine This medicine may also interact with the following medications: -chloroquine -chlorpromazine -clarithromycin -cyclophosphamide -cyclosporine -erythromycin -medicines for depression, anxiety, or psychotic disturbances -medicines for irregular heart beat like amiodarone, bepridil, dofetilide, encainide, flecainide, propafenone, quinidine -medicines for seizures like ethotoin, fosphenytoin, phenytoin -medicines for nausea, vomiting like dolasetron, ondansetron, palonosetron -medicines to increase blood counts like filgrastim, pegfilgrastim, sargramostim -methadone -methotrexate -pentamidine -progesterone -vaccines -verapamil Talk to your doctor or health care professional before taking any of these medicines: -acetaminophen -aspirin -ibuprofen -ketoprofen -naproxen This list may not describe all possible interactions. Give your health care provider a list of all the medicines, herbs, non-prescription drugs, or dietary supplements you use. Also tell them if you smoke, drink alcohol, or use illegal drugs. Some items may interact with your medicine. What should I watch for while using this medicine? Your condition will be monitored carefully while you are receiving this medicine. You will need important blood work done while you are taking this medicine. This drug may make you feel generally unwell. This is not uncommon, as chemotherapy can affect healthy cells as well as cancer cells. Report any side effects. Continue your course of treatment even though you feel ill unless your doctor tells you to stop. Your urine may turn red for a few days after your dose. This is not blood. If your urine is dark or brown, call your doctor. In some cases, you may be given additional medicines to help with side effects. Follow all directions for their use. Call your doctor or health care professional for advice if  you get a  fever, chills or sore throat, or other symptoms of a cold or flu. Do not treat yourself. This drug decreases your body's ability to fight infections. Try to avoid being around people who are sick. This medicine may increase your risk to bruise or bleed. Call your doctor or health care professional if you notice any unusual bleeding. Be careful brushing and flossing your teeth or using a toothpick because you may get an infection or bleed more easily. If you have any dental work done, tell your dentist you are receiving this medicine. Avoid taking products that contain aspirin, acetaminophen, ibuprofen, naproxen, or ketoprofen unless instructed by your doctor. These medicines may hide a fever. Men and women of childbearing age should use effective birth control methods while using taking this medicine. Do not become pregnant while taking this medicine. There is a potential for serious side effects to an unborn child. Talk to your health care professional or pharmacist for more information. Do not breast-feed an infant while taking this medicine. Do not let others touch your urine or other body fluids for 5 days after each treatment with this medicine. Caregivers should wear latex gloves to avoid touching body fluids during this time. There is a maximum amount of this medicine you should receive throughout your life. The amount depends on the medical condition being treated and your overall health. Your doctor will watch how much of this medicine you receive in your lifetime. Tell your doctor if you have taken this medicine before. What side effects may I notice from receiving this medicine? Side effects that you should report to your doctor or health care professional as soon as possible: -allergic reactions like skin rash, itching or hives, swelling of the face, lips, or tongue -low blood counts - this medicine may decrease the number of white blood cells, red blood cells and platelets. You may  be at increased risk for infections and bleeding. -signs of infection - fever or chills, cough, sore throat, pain or difficulty passing urine -signs of decreased platelets or bleeding - bruising, pinpoint red spots on the skin, black, tarry stools, blood in the urine -signs of decreased red blood cells - unusually weak or tired, fainting spells, lightheadedness -breathing problems -chest pain -fast, irregular heartbeat -mouth sores -nausea, vomiting -pain, swelling, redness at site where injected -pain, tingling, numbness in the hands or feet -swelling of ankles, feet, or hands -unusual bleeding or bruising Side effects that usually do not require medical attention (report to your doctor or health care professional if they continue or are bothersome): -diarrhea -facial flushing -hair loss -loss of appetite -missed menstrual periods -nail discoloration or damage -red or watery eyes -red colored urine -stomach upset This list may not describe all possible side effects. Call your doctor for medical advice about side effects. You may report side effects to FDA at 1-800-FDA-1088. Where should I keep my medicine? This drug is given in a hospital or clinic and will not be stored at home. NOTE: This sheet is a summary. It may not cover all possible information. If you have questions about this medicine, talk to your doctor, pharmacist, or health care provider.    2016, [employer]. (2012-04-16 09:54:34)  Vinblastine injection What is this medicine? VINBLASTINE (vin BLAS teen) is a chemotherapy drug. It slows the growth of cancer cells. This medicine is used to treat many types of cancer like breast cancer, testicular cancer, Hodgkin's disease, non-Hodgkin's lymphoma, and sarcoma. This medicine may be used for other purposes; ask  your health care provider or pharmacist if you have questions. What should I tell my health care provider before I take this medicine? They need to  know if you have any of these conditions: -blood disorders -dental disease -gout -infection (especially a virus infection such as chickenpox, cold sores, or herpes) -liver disease -lung disease -nervous system disease -recent or ongoing radiation therapy -an unusual or allergic reaction to vinblastine, other chemotherapy agents, other medicines, foods, dyes, or preservatives -pregnant or trying to get pregnant -breast-feeding How should I use this medicine? This drug is given as an infusion into a vein. It is administered in a hospital or clinic by a specially trained health care professional. If you have pain, swelling, burning or any unusual feeling around the site of your injection, tell your health care professional right away. Talk to your pediatrician regarding the use of this medicine in children. While this drug may be prescribed for selected conditions, precautions do apply. Overdosage: If you think you have taken too much of this medicine contact a poison control center or emergency room at once. NOTE: This medicine is only for you. Do not share this medicine with others. What if I miss a dose? It is important not to miss your dose. Call your doctor or health care professional if you are unable to keep an appointment. What may interact with this medicine? Do not take this medicine with any of the following medications: -erythromycin -itraconazole -mibefradil -voriconazole This medicine may also interact with the following medications: -cyclosporine -fluconazole -ketoconazole -medicines for seizures like phenytoin -medicines to increase blood counts like filgrastim, pegfilgrastim, sargramostim -vaccines -verapamil Talk to your doctor or health care professional before taking any of these medicines: -acetaminophen -aspirin -ibuprofen -ketoprofen -naproxen This list may not describe all possible interactions. Give your health care provider a list of all the medicines,  herbs, non-prescription drugs, or dietary supplements you use. Also tell them if you smoke, drink alcohol, or use illegal drugs. Some items may interact with your medicine. What should I watch for while using this medicine? Your condition will be monitored carefully while you are receiving this medicine. You will need important blood work done while you are taking this medicine. This drug may make you feel generally unwell. This is not uncommon, as chemotherapy can affect healthy cells as well as cancer cells. Report any side effects. Continue your course of treatment even though you feel ill unless your doctor tells you to stop. In some cases, you may be given additional medicines to help with side effects. Follow all directions for their use. Call your doctor or health care professional for advice if you get a fever, chills or sore throat, or other symptoms of a cold or flu. Do not treat yourself. This drug decreases your body's ability to fight infections. Try to avoid being around people who are sick. This medicine may increase your risk to bruise or bleed. Call your doctor or health care professional if you notice any unusual bleeding. Be careful brushing and flossing your teeth or using a toothpick because you may get an infection or bleed more easily. If you have any dental work done, tell your dentist you are receiving this medicine. Avoid taking products that contain aspirin, acetaminophen, ibuprofen, naproxen, or ketoprofen unless instructed by your doctor. These medicines may hide a fever. Do not become pregnant while taking this medicine. Women should inform their doctor if they wish to become pregnant or think they might be pregnant. There is a  potential for serious side effects to an unborn child. Talk to your health care professional or pharmacist for more information. Do not breast-feed an infant while taking this medicine. Men may have a lower sperm count while taking this medicine. Talk  to your doctor if you plan to father a child. What side effects may I notice from receiving this medicine? Side effects that you should report to your doctor or health care professional as soon as possible: -allergic reactions like skin rash, itching or hives, swelling of the face, lips, or tongue -low blood counts - This drug may decrease the number of white blood cells, red blood cells and platelets. You may be at increased risk for infections and bleeding. -signs of infection - fever or chills, cough, sore throat, pain or difficulty passing urine -signs of decreased platelets or bleeding - bruising, pinpoint red spots on the skin, black, tarry stools, nosebleeds -signs of decreased red blood cells - unusually weak or tired, fainting spells, lightheadedness -breathing problems -changes in hearing -change in the amount of urine -chest pain -high blood pressure -mouth sores -nausea and vomiting -pain, swelling, redness or irritation at the injection site -pain, tingling, numbness in the hands or feet -problems with balance, dizziness -seizures Side effects that usually do not require medical attention (report to your doctor or health care professional if they continue or are bothersome): -constipation -hair loss -jaw pain -loss of appetite -sensitivity to light -stomach pain -tumor pain This list may not describe all possible side effects. Call your doctor for medical advice about side effects. You may report side effects to FDA at 1-800-FDA-1088. Where should I keep my medicine? This drug is given in a hospital or clinic and will not be stored at home. NOTE: This sheet is a summary. It may not cover all possible information. If you have questions about this medicine, talk to your doctor, pharmacist, or health care provider.    2016, [employer] Standard. (2007-09-16 17:15:59)   Dacarbazine, DTIC injection What is this medicine? DACARBAZINE (da KAR ba zeen) is a chemotherapy  drug. This medicine is used to treat skin cancer. It is also used with other medicines to treat Hodgkin's disease. This medicine may be used for other purposes; ask your health care provider or pharmacist if you have questions. What should I tell my health care provider before I take this medicine? They need to know if you have any of these conditions: -infection (especially virus infection such as chickenpox, cold sores, or herpes) -kidney disease -liver disease -low blood counts like low platelets, red blood cells, white blood cells -recent radiation therapy -an unusual or allergic reaction to dacarbazine, other chemotherapy agents, other medicines, foods, dyes, or preservatives -pregnant or trying to get pregnant -breast-feeding How should I use this medicine? This drug is given as an injection or infusion into a vein. It is administered in a hospital or clinic by a specially trained health care professional. Talk to your pediatrician regarding the use of this medicine in children. While this drug may be prescribed for selected conditions, precautions do apply. Overdosage: If you think you have taken too much of this medicine contact a poison control center or emergency room at once. NOTE: This medicine is only for you. Do not share this medicine with others. What if I miss a dose? It is important not to miss your dose. Call your doctor or health care professional if you are unable to keep an appointment. What may interact with this medicine? -medicines  to increase blood counts like filgrastim, pegfilgrastim, sargramostim -vaccines This list may not describe all possible interactions. Give your health care provider a list of all the medicines, herbs, non-prescription drugs, or dietary supplements you use. Also tell them if you smoke, drink alcohol, or use illegal drugs. Some items may interact with your medicine. What should I watch for while using this medicine? Your condition will be  monitored carefully while you are receiving this medicine. You will need important blood work done while you are taking this medicine. This drug may make you feel generally unwell. This is not uncommon, as chemotherapy can affect healthy cells as well as cancer cells. Report any side effects. Continue your course of treatment even though you feel ill unless your doctor tells you to stop. Call your doctor or health care professional for advice if you get a fever, chills or sore throat, or other symptoms of a cold or flu. Do not treat yourself. This drug decreases your body's ability to fight infections. Try to avoid being around people who are sick. This medicine may increase your risk to bruise or bleed. Call your doctor or health care professional if you notice any unusual bleeding. Be careful brushing and flossing your teeth or using a toothpick because you may get an infection or bleed more easily. If you have any dental work done, tell your dentist you are receiving this medicine. Avoid taking products that contain aspirin, acetaminophen, ibuprofen, naproxen, or ketoprofen unless instructed by your doctor. These medicines may hide a fever. Do not become pregnant while taking this medicine. Women should inform their doctor if they wish to become pregnant or think they might be pregnant. There is a potential for serious side effects to an unborn child. Talk to your health care professional or pharmacist for more information. Do not breast-feed an infant while taking this medicine. What side effects may I notice from receiving this medicine? Side effects that you should report to your doctor or health care professional as soon as possible: -allergic reactions like skin rash, itching or hives, swelling of the face, lips, or tongue -low blood counts - this medicine may decrease the number of white blood cells, red blood cells and platelets. You may be at increased risk for infections and bleeding. -signs  of infection - fever or chills, cough, sore throat, pain or difficulty passing urine -signs of decreased platelets or bleeding - bruising, pinpoint red spots on the skin, black, tarry stools, blood in the urine -signs of decreased red blood cells - unusually weak or tired, fainting spells, lightheadedness -breathing problems -muscle pains -pain at site where injected -trouble passing urine or change in the amount of urine -vomiting -yellowing of the eyes or skin Side effects that usually do not require medical attention (report to your doctor or health care professional if they continue or are bothersome): -diarrhea -hair loss -loss of appetite -nausea -skin more sensitive to sun or ultraviolet light -stomach upset This list may not describe all possible side effects. Call your doctor for medical advice about side effects. You may report side effects to FDA at 1-800-FDA-1088. Where should I keep my medicine? This drug is given in a hospital or clinic and will not be stored at home. NOTE: This sheet is a summary. It may not cover all possible information. If you have questions about this medicine, talk to your doctor, pharmacist, or health care provider.    2016, [employer] Standard. (2007-04-09 16:56:39) Bleomycin injection What is this medicine?  BLEOMYCIN (blee oh MYE sin) is a chemotherapy drug. It is used to treat many kinds of cancer like lymphoma, cervical cancer, head and neck cancer, and testicular cancer. It is also used to prevent and to treat fluid build-up around the lungs caused by some cancers. This medicine may be used for other purposes; ask your health care provider or pharmacist if you have questions. What should I tell my health care provider before I take this medicine? They need to know if you have any of these conditions: -cigarette smoker -kidney disease -lung disease -recent or ongoing radiation therapy -an unusual or allergic reaction to bleomycin, other  chemotherapy agents, other medicines, foods, dyes, or preservatives -pregnant or trying to get pregnant -breast-feeding How should I use this medicine? This drug is given as an infusion into a vein or a body cavity. It can also be given as an injection into a muscle or under the skin. It is administered in a hospital or clinic by a specially trained health care professional. Talk to your pediatrician regarding the use of this medicine in children. Special care may be needed. Overdosage: If you think you have taken too much of this medicine contact a poison control center or emergency room at once. NOTE: This medicine is only for you. Do not share this medicine with others. What if I miss a dose? It is important not to miss your dose. Call your doctor or health care professional if you are unable to keep an appointment. What may interact with this medicine? -certain antibiotics given by injection -cisplatin -cyclosporine -diuretics -foscarnet -medicines to increase blood counts like filgrastim, pegfilgrastim, sargramostim -vaccines This list may not describe all possible interactions. Give your health care provider a list of all the medicines, herbs, non-prescription drugs, or dietary supplements you use. Also tell them if you smoke, drink alcohol, or use illegal drugs. Some items may interact with your medicine. What should I watch for while using this medicine? Visit your doctor for checks on your progress. This drug may make you feel generally unwell. This is not uncommon, as chemotherapy can affect healthy cells as well as cancer cells. Report any side effects. Continue your course of treatment even though you feel ill unless your doctor tells you to stop. Call your doctor or health care professional for advice if you get a fever, chills or sore throat, or other symptoms of a cold or flu. Do not treat yourself. This drug decreases your body's ability to fight infections. Try to avoid being  around people who are sick. Avoid taking products that contain aspirin, acetaminophen, ibuprofen, naproxen, or ketoprofen unless instructed by your doctor. These medicines may hide a fever. Do not become pregnant while taking this medicine. Women should inform their doctor if they wish to become pregnant or think they might be pregnant. There is a potential for serious side effects to an unborn child. Talk to your health care professional or pharmacist for more information. Do not breast-feed an infant while taking this medicine. There is a maximum amount of this medicine you should receive throughout your life. The amount depends on the medical condition being treated and your overall health. Your doctor will watch how much of this medicine you receive in your lifetime. Tell your doctor if you have taken this medicine before. What side effects may I notice from receiving this medicine? Side effects that you should report to your doctor or health care professional as soon as possible: -allergic reactions like skin rash,  itching or hives, swelling of the face, lips, or tongue -breathing problems -chest pain -confusion -cough -fast, irregular heartbeat -feeling faint or lightheaded, falls -fever or chills -mouth sores -pain, tingling, numbness in the hands or feet -trouble passing urine or change in the amount of urine -yellowing of the eyes or skin Side effects that usually do not require medical attention (report to your doctor or health care professional if they continue or are bothersome): -darker skin color -hair loss -irritation at site where injected -loss of appetite -nail changes -nausea and vomiting -weight loss This list may not describe all possible side effects. Call your doctor for medical advice about side effects. You may report side effects to FDA at 1-800-FDA-1088. Where should I keep my medicine? This drug is given in a hospital or clinic and will not be stored at  home. NOTE: This sheet is a summary. It may not cover all possible information. If you have questions about this medicine, talk to your doctor, pharmacist, or health care provider.    2016, [employer] Standard. (2012-04-16 09:36:48)  [facility] Discharge Instructions for Patients Receiving Chemotherapy  Today you received the following chemotherapy agents Adriamycin, Vinblastine, Bleocin and Dacarbazine. To help prevent nausea and vomiting after your treatment, we encourage you to take your nausea medication as directed.   If you develop nausea and vomiting that is not controlled by your nausea medication, call the clinic.   BELOW ARE SYMPTOMS THAT SHOULD BE REPORTED IMMEDIATELY:  *FEVER GREATER THAN 100.5 F  *CHILLS WITH OR WITHOUT FEVER  NAUSEA AND VOMITING THAT IS NOT CONTROLLED WITH YOUR NAUSEA MEDICATION  *UNUSUAL SHORTNESS OF BREATH  *UNUSUAL BRUISING OR BLEEDING  TENDERNESS IN MOUTH AND THROAT WITH OR WITHOUT PRESENCE OF ULCERS  *URINARY PROBLEMS  *BOWEL PROBLEMS  UNUSUAL RASH Items with * indicate a potential emergency and should be followed up as soon as possible.  Feel free to call the clinic you have any questions or concerns. The clinic phone number is [phone].  Please show the [facility] at check-in to the Emergency Department and triage nurse.

## 2015-05-04 NOTE — Telephone Encounter (Signed)
per pof to sch pt appt-sent Dr. Julien Nordmann email to adv no opeings on 5/30 and trmt room capped-will sch once reply

## 2015-05-04 NOTE — Telephone Encounter (Signed)
Per staff message and POF I have scheduled appts. Advised scheduler of appts. JMW  

## 2015-05-10 ENCOUNTER — Ambulatory Visit (INDEPENDENT_AMBULATORY_CARE_PROVIDER_SITE_OTHER): Payer: Self-pay

## 2015-05-10 DIAGNOSIS — I313 Pericardial effusion (noninflammatory): Secondary | ICD-10-CM

## 2015-05-10 DIAGNOSIS — I3139 Other pericardial effusion (noninflammatory): Secondary | ICD-10-CM

## 2015-05-10 DIAGNOSIS — Z9889 Other specified postprocedural states: Secondary | ICD-10-CM

## 2015-05-10 DIAGNOSIS — Z4802 Encounter for removal of sutures: Secondary | ICD-10-CM

## 2015-05-10 NOTE — Progress Notes (Signed)
Removed 1 suture from chest tube site with no signs of infection and patient tolerated well. 

## 2015-05-11 ENCOUNTER — Other Ambulatory Visit: Payer: Self-pay

## 2015-05-14 ENCOUNTER — Encounter: Payer: Self-pay | Admitting: *Deleted

## 2015-05-18 ENCOUNTER — Encounter: Payer: Self-pay | Admitting: Internal Medicine

## 2015-05-18 ENCOUNTER — Ambulatory Visit (HOSPITAL_BASED_OUTPATIENT_CLINIC_OR_DEPARTMENT_OTHER): Payer: Self-pay | Admitting: Internal Medicine

## 2015-05-18 ENCOUNTER — Telehealth: Payer: Self-pay | Admitting: Internal Medicine

## 2015-05-18 ENCOUNTER — Ambulatory Visit: Payer: Medicaid Other | Admitting: Thoracic Surgery (Cardiothoracic Vascular Surgery)

## 2015-05-18 ENCOUNTER — Telehealth: Payer: Self-pay | Admitting: *Deleted

## 2015-05-18 ENCOUNTER — Ambulatory Visit (HOSPITAL_BASED_OUTPATIENT_CLINIC_OR_DEPARTMENT_OTHER): Payer: Self-pay

## 2015-05-18 ENCOUNTER — Other Ambulatory Visit (HOSPITAL_BASED_OUTPATIENT_CLINIC_OR_DEPARTMENT_OTHER): Payer: Self-pay

## 2015-05-18 VITALS — BP 125/87 | HR 68 | Temp 98.1°F | Resp 18 | Ht 70.0 in | Wt 164.5 lb

## 2015-05-18 DIAGNOSIS — Z5111 Encounter for antineoplastic chemotherapy: Secondary | ICD-10-CM

## 2015-05-18 DIAGNOSIS — C8112 Nodular sclerosis classical Hodgkin lymphoma, intrathoracic lymph nodes: Secondary | ICD-10-CM

## 2015-05-18 DIAGNOSIS — D630 Anemia in neoplastic disease: Secondary | ICD-10-CM

## 2015-05-18 LAB — CBC WITH DIFFERENTIAL/PLATELET
BASO%: 3.1 % — ABNORMAL HIGH (ref 0.0–2.0)
Basophils Absolute: 0.1 10*3/uL (ref 0.0–0.1)
EOS ABS: 0.1 10*3/uL (ref 0.0–0.5)
EOS%: 3.4 % (ref 0.0–7.0)
HEMATOCRIT: 39.2 % (ref 38.4–49.9)
HEMOGLOBIN: 12.2 g/dL — AB (ref 13.0–17.1)
LYMPH#: 1.4 10*3/uL (ref 0.9–3.3)
LYMPH%: 41.8 % (ref 14.0–49.0)
MCH: 24.9 pg — AB (ref 27.2–33.4)
MCHC: 31.1 g/dL — ABNORMAL LOW (ref 32.0–36.0)
MCV: 80 fL (ref 79.3–98.0)
MONO#: 0.3 10*3/uL (ref 0.1–0.9)
MONO%: 10.5 % (ref 0.0–14.0)
NEUT%: 41.2 % (ref 39.0–75.0)
NEUTROS ABS: 1.3 10*3/uL — AB (ref 1.5–6.5)
PLATELETS: 271 10*3/uL (ref 140–400)
RBC: 4.9 10*6/uL (ref 4.20–5.82)
RDW: 20.5 % — ABNORMAL HIGH (ref 11.0–14.6)
WBC: 3.2 10*3/uL — ABNORMAL LOW (ref 4.0–10.3)
nRBC: 0 % (ref 0–0)

## 2015-05-18 LAB — COMPREHENSIVE METABOLIC PANEL
ALBUMIN: 3.6 g/dL (ref 3.5–5.0)
ALK PHOS: 160 U/L — AB (ref 40–150)
ALT: 22 U/L (ref 0–55)
AST: 15 U/L (ref 5–34)
Anion Gap: 7 mEq/L (ref 3–11)
BUN: 8.1 mg/dL (ref 7.0–26.0)
CALCIUM: 9.9 mg/dL (ref 8.4–10.4)
CO2: 28 mEq/L (ref 22–29)
Chloride: 104 mEq/L (ref 98–109)
Creatinine: 0.8 mg/dL (ref 0.7–1.3)
Glucose: 100 mg/dl (ref 70–140)
POTASSIUM: 4.3 meq/L (ref 3.5–5.1)
SODIUM: 139 meq/L (ref 136–145)
Total Bilirubin: <0.3 mg/dL (ref 0.20–1.20)
Total Protein: 7.8 g/dL (ref 6.4–8.3)

## 2015-05-18 MED ORDER — HEPARIN SOD (PORK) LOCK FLUSH 100 UNIT/ML IV SOLN
500.0000 [IU] | Freq: Once | INTRAVENOUS | Status: AC | PRN
  Administered 2015-05-18: 500 [IU]
  Filled 2015-05-18: qty 5

## 2015-05-18 MED ORDER — SODIUM CHLORIDE 0.9 % IV SOLN
Freq: Once | INTRAVENOUS | Status: AC
  Administered 2015-05-18: 13:00:00 via INTRAVENOUS

## 2015-05-18 MED ORDER — PALONOSETRON HCL INJECTION 0.25 MG/5ML
0.2500 mg | Freq: Once | INTRAVENOUS | Status: AC
  Administered 2015-05-18: 0.25 mg via INTRAVENOUS

## 2015-05-18 MED ORDER — DOXORUBICIN HCL CHEMO IV INJECTION 2 MG/ML
25.0000 mg/m2 | Freq: Once | INTRAVENOUS | Status: AC
  Administered 2015-05-18: 48 mg via INTRAVENOUS
  Filled 2015-05-18: qty 24

## 2015-05-18 MED ORDER — PALONOSETRON HCL INJECTION 0.25 MG/5ML
INTRAVENOUS | Status: AC
  Filled 2015-05-18: qty 5

## 2015-05-18 MED ORDER — SODIUM CHLORIDE 0.9 % IV SOLN
10.0000 [IU]/m2 | Freq: Once | INTRAVENOUS | Status: AC
  Administered 2015-05-18: 19 [IU] via INTRAVENOUS
  Filled 2015-05-18: qty 6.33

## 2015-05-18 MED ORDER — SODIUM CHLORIDE 0.9 % IV SOLN
Freq: Once | INTRAVENOUS | Status: AC
  Administered 2015-05-18: 13:00:00 via INTRAVENOUS
  Filled 2015-05-18: qty 5

## 2015-05-18 MED ORDER — SODIUM CHLORIDE 0.9% FLUSH
10.0000 mL | INTRAVENOUS | Status: DC | PRN
  Administered 2015-05-18: 10 mL
  Filled 2015-05-18: qty 10

## 2015-05-18 MED ORDER — SODIUM CHLORIDE 0.9 % IV SOLN
5.7000 mg/m2 | Freq: Once | INTRAVENOUS | Status: AC
  Administered 2015-05-18: 11 mg via INTRAVENOUS
  Filled 2015-05-18: qty 11

## 2015-05-18 MED ORDER — SODIUM CHLORIDE 0.9 % IV SOLN
375.0000 mg/m2 | Freq: Once | INTRAVENOUS | Status: AC
  Administered 2015-05-18: 720 mg via INTRAVENOUS
  Filled 2015-05-18: qty 36

## 2015-05-18 NOTE — Patient Instructions (Signed)
PICK UP EMLA CREAM FROM [facility] - PUT ON SO IT IS ON FOR 1.5 TO 2 HOURS PRIOR TO NEEDING TO BE USED. Doxorubicin injection What is this medicine? DOXORUBICIN (dox oh ROO bi sin) is a chemotherapy drug. It is used to treat many kinds of cancer like Hodgkin's disease, leukemia, non-Hodgkin's lymphoma, neuroblastoma, sarcoma, and Wilms' tumor. It is also used to treat bladder cancer, breast cancer, lung cancer, ovarian cancer, stomach cancer, and thyroid cancer. This medicine may be used for other purposes; ask your health care provider or pharmacist if you have questions. What should I tell my health care provider before I take this medicine? They need to know if you have any of these conditions: -blood disorders -heart disease, recent heart attack -infection (especially a virus infection such as chickenpox, cold sores, or herpes) -irregular heartbeat -liver disease -recent or ongoing radiation therapy -an unusual or allergic reaction to doxorubicin, other chemotherapy agents, other medicines, foods, dyes, or preservatives -pregnant or trying to get pregnant -breast-feeding How should I use this medicine? This drug is given as an infusion into a vein. It is administered in a hospital or clinic by a specially trained health care professional. If you have pain, swelling, burning or any unusual feeling around the site of your injection, tell your health care professional right away. Talk to your pediatrician regarding the use of this medicine in children. Special care may be needed. Overdosage: If you think you have taken too much of this medicine contact a poison control center or emergency room at once. NOTE: This medicine is only for you. Do not share this medicine with others. What if I miss a dose? It is important not to miss your dose. Call your doctor or health care professional if you are unable to keep an appointment. What may interact with this medicine? Do not take this medicine with any of  the following medications: -cisapride -droperidol -halofantrine -pimozide -zidovudine This medicine may also interact with the following medications: -chloroquine -chlorpromazine -clarithromycin -cyclophosphamide -cyclosporine -erythromycin -medicines for depression, anxiety, or psychotic disturbances -medicines for irregular heart beat like amiodarone, bepridil, dofetilide, encainide, flecainide, propafenone, quinidine -medicines for seizures like ethotoin, fosphenytoin, phenytoin -medicines for nausea, vomiting like dolasetron, ondansetron, palonosetron -medicines to increase blood counts like filgrastim, pegfilgrastim, sargramostim -methadone -methotrexate -pentamidine -progesterone -vaccines -verapamil Talk to your doctor or health care professional before taking any of these medicines: -acetaminophen -aspirin -ibuprofen -ketoprofen -naproxen This list may not describe all possible interactions. Give your health care provider a list of all the medicines, herbs, non-prescription drugs, or dietary supplements you use. Also tell them if you smoke, drink alcohol, or use illegal drugs. Some items may interact with your medicine. What should I watch for while using this medicine? Your condition will be monitored carefully while you are receiving this medicine. You will need important blood work done while you are taking this medicine. This drug may make you feel generally unwell. This is not uncommon, as chemotherapy can affect healthy cells as well as cancer cells. Report any side effects. Continue your course of treatment even though you feel ill unless your doctor tells you to stop. Your urine may turn red for a few days after your dose. This is not blood. If your urine is dark or brown, call your doctor. In some cases, you may be given additional medicines to help with side effects. Follow all directions for their use. Call your doctor or health care professional for advice if  you get a  fever, chills or sore throat, or other symptoms of a cold or flu. Do not treat yourself. This drug decreases your body's ability to fight infections. Try to avoid being around people who are sick. This medicine may increase your risk to bruise or bleed. Call your doctor or health care professional if you notice any unusual bleeding. Be careful brushing and flossing your teeth or using a toothpick because you may get an infection or bleed more easily. If you have any dental work done, tell your dentist you are receiving this medicine. Avoid taking products that contain aspirin, acetaminophen, ibuprofen, naproxen, or ketoprofen unless instructed by your doctor. These medicines may hide a fever. Men and women of childbearing age should use effective birth control methods while using taking this medicine. Do not become pregnant while taking this medicine. There is a potential for serious side effects to an unborn child. Talk to your health care professional or pharmacist for more information. Do not breast-feed an infant while taking this medicine. Do not let others touch your urine or other body fluids for 5 days after each treatment with this medicine. Caregivers should wear latex gloves to avoid touching body fluids during this time. There is a maximum amount of this medicine you should receive throughout your life. The amount depends on the medical condition being treated and your overall health. Your doctor will watch how much of this medicine you receive in your lifetime. Tell your doctor if you have taken this medicine before. What side effects may I notice from receiving this medicine? Side effects that you should report to your doctor or health care professional as soon as possible: -allergic reactions like skin rash, itching or hives, swelling of the face, lips, or tongue -low blood counts - this medicine may decrease the number of white blood cells, red blood cells and platelets. You may  be at increased risk for infections and bleeding. -signs of infection - fever or chills, cough, sore throat, pain or difficulty passing urine -signs of decreased platelets or bleeding - bruising, pinpoint red spots on the skin, black, tarry stools, blood in the urine -signs of decreased red blood cells - unusually weak or tired, fainting spells, lightheadedness -breathing problems -chest pain -fast, irregular heartbeat -mouth sores -nausea, vomiting -pain, swelling, redness at site where injected -pain, tingling, numbness in the hands or feet -swelling of ankles, feet, or hands -unusual bleeding or bruising Side effects that usually do not require medical attention (report to your doctor or health care professional if they continue or are bothersome): -diarrhea -facial flushing -hair loss -loss of appetite -missed menstrual periods -nail discoloration or damage -red or watery eyes -red colored urine -stomach upset This list may not describe all possible side effects. Call your doctor for medical advice about side effects. You may report side effects to FDA at 1-800-FDA-1088. Where should I keep my medicine? This drug is given in a hospital or clinic and will not be stored at home. NOTE: This sheet is a summary. It may not cover all possible information. If you have questions about this medicine, talk to your doctor, pharmacist, or health care provider.    2016, [employer]. (2012-04-16 09:54:34)  Vinblastine injection What is this medicine? VINBLASTINE (vin BLAS teen) is a chemotherapy drug. It slows the growth of cancer cells. This medicine is used to treat many types of cancer like breast cancer, testicular cancer, Hodgkin's disease, non-Hodgkin's lymphoma, and sarcoma. This medicine may be used for other purposes; ask  your health care provider or pharmacist if you have questions. What should I tell my health care provider before I take this medicine? They need to  know if you have any of these conditions: -blood disorders -dental disease -gout -infection (especially a virus infection such as chickenpox, cold sores, or herpes) -liver disease -lung disease -nervous system disease -recent or ongoing radiation therapy -an unusual or allergic reaction to vinblastine, other chemotherapy agents, other medicines, foods, dyes, or preservatives -pregnant or trying to get pregnant -breast-feeding How should I use this medicine? This drug is given as an infusion into a vein. It is administered in a hospital or clinic by a specially trained health care professional. If you have pain, swelling, burning or any unusual feeling around the site of your injection, tell your health care professional right away. Talk to your pediatrician regarding the use of this medicine in children. While this drug may be prescribed for selected conditions, precautions do apply. Overdosage: If you think you have taken too much of this medicine contact a poison control center or emergency room at once. NOTE: This medicine is only for you. Do not share this medicine with others. What if I miss a dose? It is important not to miss your dose. Call your doctor or health care professional if you are unable to keep an appointment. What may interact with this medicine? Do not take this medicine with any of the following medications: -erythromycin -itraconazole -mibefradil -voriconazole This medicine may also interact with the following medications: -cyclosporine -fluconazole -ketoconazole -medicines for seizures like phenytoin -medicines to increase blood counts like filgrastim, pegfilgrastim, sargramostim -vaccines -verapamil Talk to your doctor or health care professional before taking any of these medicines: -acetaminophen -aspirin -ibuprofen -ketoprofen -naproxen This list may not describe all possible interactions. Give your health care provider a list of all the medicines,  herbs, non-prescription drugs, or dietary supplements you use. Also tell them if you smoke, drink alcohol, or use illegal drugs. Some items may interact with your medicine. What should I watch for while using this medicine? Your condition will be monitored carefully while you are receiving this medicine. You will need important blood work done while you are taking this medicine. This drug may make you feel generally unwell. This is not uncommon, as chemotherapy can affect healthy cells as well as cancer cells. Report any side effects. Continue your course of treatment even though you feel ill unless your doctor tells you to stop. In some cases, you may be given additional medicines to help with side effects. Follow all directions for their use. Call your doctor or health care professional for advice if you get a fever, chills or sore throat, or other symptoms of a cold or flu. Do not treat yourself. This drug decreases your body's ability to fight infections. Try to avoid being around people who are sick. This medicine may increase your risk to bruise or bleed. Call your doctor or health care professional if you notice any unusual bleeding. Be careful brushing and flossing your teeth or using a toothpick because you may get an infection or bleed more easily. If you have any dental work done, tell your dentist you are receiving this medicine. Avoid taking products that contain aspirin, acetaminophen, ibuprofen, naproxen, or ketoprofen unless instructed by your doctor. These medicines may hide a fever. Do not become pregnant while taking this medicine. Women should inform their doctor if they wish to become pregnant or think they might be pregnant. There is a  potential for serious side effects to an unborn child. Talk to your health care professional or pharmacist for more information. Do not breast-feed an infant while taking this medicine. Men may have a lower sperm count while taking this medicine. Talk  to your doctor if you plan to father a child. What side effects may I notice from receiving this medicine? Side effects that you should report to your doctor or health care professional as soon as possible: -allergic reactions like skin rash, itching or hives, swelling of the face, lips, or tongue -low blood counts - This drug may decrease the number of white blood cells, red blood cells and platelets. You may be at increased risk for infections and bleeding. -signs of infection - fever or chills, cough, sore throat, pain or difficulty passing urine -signs of decreased platelets or bleeding - bruising, pinpoint red spots on the skin, black, tarry stools, nosebleeds -signs of decreased red blood cells - unusually weak or tired, fainting spells, lightheadedness -breathing problems -changes in hearing -change in the amount of urine -chest pain -high blood pressure -mouth sores -nausea and vomiting -pain, swelling, redness or irritation at the injection site -pain, tingling, numbness in the hands or feet -problems with balance, dizziness -seizures Side effects that usually do not require medical attention (report to your doctor or health care professional if they continue or are bothersome): -constipation -hair loss -jaw pain -loss of appetite -sensitivity to light -stomach pain -tumor pain This list may not describe all possible side effects. Call your doctor for medical advice about side effects. You may report side effects to FDA at 1-800-FDA-1088. Where should I keep my medicine? This drug is given in a hospital or clinic and will not be stored at home. NOTE: This sheet is a summary. It may not cover all possible information. If you have questions about this medicine, talk to your doctor, pharmacist, or health care provider.    2016, [employer] Standard. (2007-09-16 17:15:59)   Dacarbazine, DTIC injection What is this medicine? DACARBAZINE (da KAR ba zeen) is a chemotherapy  drug. This medicine is used to treat skin cancer. It is also used with other medicines to treat Hodgkin's disease. This medicine may be used for other purposes; ask your health care provider or pharmacist if you have questions. What should I tell my health care provider before I take this medicine? They need to know if you have any of these conditions: -infection (especially virus infection such as chickenpox, cold sores, or herpes) -kidney disease -liver disease -low blood counts like low platelets, red blood cells, white blood cells -recent radiation therapy -an unusual or allergic reaction to dacarbazine, other chemotherapy agents, other medicines, foods, dyes, or preservatives -pregnant or trying to get pregnant -breast-feeding How should I use this medicine? This drug is given as an injection or infusion into a vein. It is administered in a hospital or clinic by a specially trained health care professional. Talk to your pediatrician regarding the use of this medicine in children. While this drug may be prescribed for selected conditions, precautions do apply. Overdosage: If you think you have taken too much of this medicine contact a poison control center or emergency room at once. NOTE: This medicine is only for you. Do not share this medicine with others. What if I miss a dose? It is important not to miss your dose. Call your doctor or health care professional if you are unable to keep an appointment. What may interact with this medicine? -medicines  to increase blood counts like filgrastim, pegfilgrastim, sargramostim -vaccines This list may not describe all possible interactions. Give your health care provider a list of all the medicines, herbs, non-prescription drugs, or dietary supplements you use. Also tell them if you smoke, drink alcohol, or use illegal drugs. Some items may interact with your medicine. What should I watch for while using this medicine? Your condition will be  monitored carefully while you are receiving this medicine. You will need important blood work done while you are taking this medicine. This drug may make you feel generally unwell. This is not uncommon, as chemotherapy can affect healthy cells as well as cancer cells. Report any side effects. Continue your course of treatment even though you feel ill unless your doctor tells you to stop. Call your doctor or health care professional for advice if you get a fever, chills or sore throat, or other symptoms of a cold or flu. Do not treat yourself. This drug decreases your body's ability to fight infections. Try to avoid being around people who are sick. This medicine may increase your risk to bruise or bleed. Call your doctor or health care professional if you notice any unusual bleeding. Be careful brushing and flossing your teeth or using a toothpick because you may get an infection or bleed more easily. If you have any dental work done, tell your dentist you are receiving this medicine. Avoid taking products that contain aspirin, acetaminophen, ibuprofen, naproxen, or ketoprofen unless instructed by your doctor. These medicines may hide a fever. Do not become pregnant while taking this medicine. Women should inform their doctor if they wish to become pregnant or think they might be pregnant. There is a potential for serious side effects to an unborn child. Talk to your health care professional or pharmacist for more information. Do not breast-feed an infant while taking this medicine. What side effects may I notice from receiving this medicine? Side effects that you should report to your doctor or health care professional as soon as possible: -allergic reactions like skin rash, itching or hives, swelling of the face, lips, or tongue -low blood counts - this medicine may decrease the number of white blood cells, red blood cells and platelets. You may be at increased risk for infections and bleeding. -signs  of infection - fever or chills, cough, sore throat, pain or difficulty passing urine -signs of decreased platelets or bleeding - bruising, pinpoint red spots on the skin, black, tarry stools, blood in the urine -signs of decreased red blood cells - unusually weak or tired, fainting spells, lightheadedness -breathing problems -muscle pains -pain at site where injected -trouble passing urine or change in the amount of urine -vomiting -yellowing of the eyes or skin Side effects that usually do not require medical attention (report to your doctor or health care professional if they continue or are bothersome): -diarrhea -hair loss -loss of appetite -nausea -skin more sensitive to sun or ultraviolet light -stomach upset This list may not describe all possible side effects. Call your doctor for medical advice about side effects. You may report side effects to FDA at 1-800-FDA-1088. Where should I keep my medicine? This drug is given in a hospital or clinic and will not be stored at home. NOTE: This sheet is a summary. It may not cover all possible information. If you have questions about this medicine, talk to your doctor, pharmacist, or health care provider.    2016, [employer] Standard. (2007-04-09 16:56:39) Bleomycin injection What is this medicine?   BLEOMYCIN (blee oh MYE sin) is a chemotherapy drug. It is used to treat many kinds of cancer like lymphoma, cervical cancer, head and neck cancer, and testicular cancer. It is also used to prevent and to treat fluid build-up around the lungs caused by some cancers. This medicine may be used for other purposes; ask your health care provider or pharmacist if you have questions. What should I tell my health care provider before I take this medicine? They need to know if you have any of these conditions: -cigarette smoker -kidney disease -lung disease -recent or ongoing radiation therapy -an unusual or allergic reaction to bleomycin, other  chemotherapy agents, other medicines, foods, dyes, or preservatives -pregnant or trying to get pregnant -breast-feeding How should I use this medicine? This drug is given as an infusion into a vein or a body cavity. It can also be given as an injection into a muscle or under the skin. It is administered in a hospital or clinic by a specially trained health care professional. Talk to your pediatrician regarding the use of this medicine in children. Special care may be needed. Overdosage: If you think you have taken too much of this medicine contact a poison control center or emergency room at once. NOTE: This medicine is only for you. Do not share this medicine with others. What if I miss a dose? It is important not to miss your dose. Call your doctor or health care professional if you are unable to keep an appointment. What may interact with this medicine? -certain antibiotics given by injection -cisplatin -cyclosporine -diuretics -foscarnet -medicines to increase blood counts like filgrastim, pegfilgrastim, sargramostim -vaccines This list may not describe all possible interactions. Give your health care provider a list of all the medicines, herbs, non-prescription drugs, or dietary supplements you use. Also tell them if you smoke, drink alcohol, or use illegal drugs. Some items may interact with your medicine. What should I watch for while using this medicine? Visit your doctor for checks on your progress. This drug may make you feel generally unwell. This is not uncommon, as chemotherapy can affect healthy cells as well as cancer cells. Report any side effects. Continue your course of treatment even though you feel ill unless your doctor tells you to stop. Call your doctor or health care professional for advice if you get a fever, chills or sore throat, or other symptoms of a cold or flu. Do not treat yourself. This drug decreases your body's ability to fight infections. Try to avoid being  around people who are sick. Avoid taking products that contain aspirin, acetaminophen, ibuprofen, naproxen, or ketoprofen unless instructed by your doctor. These medicines may hide a fever. Do not become pregnant while taking this medicine. Women should inform their doctor if they wish to become pregnant or think they might be pregnant. There is a potential for serious side effects to an unborn child. Talk to your health care professional or pharmacist for more information. Do not breast-feed an infant while taking this medicine. There is a maximum amount of this medicine you should receive throughout your life. The amount depends on the medical condition being treated and your overall health. Your doctor will watch how much of this medicine you receive in your lifetime. Tell your doctor if you have taken this medicine before. What side effects may I notice from receiving this medicine? Side effects that you should report to your doctor or health care professional as soon as possible: -allergic reactions like skin rash,  itching or hives, swelling of the face, lips, or tongue -breathing problems -chest pain -confusion -cough -fast, irregular heartbeat -feeling faint or lightheaded, falls -fever or chills -mouth sores -pain, tingling, numbness in the hands or feet -trouble passing urine or change in the amount of urine -yellowing of the eyes or skin Side effects that usually do not require medical attention (report to your doctor or health care professional if they continue or are bothersome): -darker skin color -hair loss -irritation at site where injected -loss of appetite -nail changes -nausea and vomiting -weight loss This list may not describe all possible side effects. Call your doctor for medical advice about side effects. You may report side effects to FDA at 1-800-FDA-1088. Where should I keep my medicine? This drug is given in a hospital or clinic and will not be stored at  home. NOTE: This sheet is a summary. It may not cover all possible information. If you have questions about this medicine, talk to your doctor, pharmacist, or health care provider.    2016, [employer] Standard. (2012-04-16 09:36:48)  [facility] Discharge Instructions for Patients Receiving Chemotherapy  Today you received the following chemotherapy agents Adriamycin, Vinblastine, Bleocin and Dacarbazine. To help prevent nausea and vomiting after your treatment, we encourage you to take your nausea medication as directed.   If you develop nausea and vomiting that is not controlled by your nausea medication, call the clinic.   BELOW ARE SYMPTOMS THAT SHOULD BE REPORTED IMMEDIATELY:  *FEVER GREATER THAN 100.5 F  *CHILLS WITH OR WITHOUT FEVER  NAUSEA AND VOMITING THAT IS NOT CONTROLLED WITH YOUR NAUSEA MEDICATION  *UNUSUAL SHORTNESS OF BREATH  *UNUSUAL BRUISING OR BLEEDING  TENDERNESS IN MOUTH AND THROAT WITH OR WITHOUT PRESENCE OF ULCERS  *URINARY PROBLEMS  *BOWEL PROBLEMS  UNUSUAL RASH Items with * indicate a potential emergency and should be followed up as soon as possible.  Feel free to call the clinic you have any questions or concerns. The clinic phone number is [phone].  Please show the [facility] at check-in to the Emergency Department and triage nurse.

## 2015-05-18 NOTE — Progress Notes (Signed)
Per MD " Ok to treat despite counts"

## 2015-05-18 NOTE — Telephone Encounter (Signed)
per pof to sch pt appt--sent Mw email to [facility] trmt-gave pt copy of avs

## 2015-05-18 NOTE — Telephone Encounter (Signed)
Per staff message and POF I have scheduled appts. Advised scheduler of appts. JMW  

## 2015-05-18 NOTE — Progress Notes (Signed)
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 Cytology was negative for lymphoma. 2) status post subxiphoid pericardial window under the care of Dr. Roxan Hockey on 04/29/2015  CURRENT THERAPY: Systemic chemotherapy with ABVD. First dose 04/20/2015. Status post one cycle.  INTERVAL HISTORY: Jeffery Crane 20 y.o. male returns to the clinic today for follow-up visit accompanied by a family member. He is feeling much better today. He tolerated the last dose of his treatment fairly well with no significant adverse effects. He noticed almost complete resolution of the right cervical and supraclavicular lymph nodes.  He denied having any significant fever or chills. He denied having any nausea or vomiting. He denied having any significant shortness breath and has mild cough with no hemoptysis. He is here today to start day 1 of cycle #2.  MEDICAL HISTORY: Past Medical History  Diagnosis Date  . Adult ADHD   . Pneumonia   . Headache   . GERD (gastroesophageal reflux disease)   . 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 Anemia   . Encounter for antineoplastic chemotherapy 05/04/2015    ALLERGIES:  is allergic to amoxicillin-pot clavulanate and bactrim.  MEDICATIONS:  Current Outpatient Prescriptions  Medication Sig Dispense Refill  . allopurinol (ZYLOPRIM) 100 MG tablet Take 1 tablet (100 mg total) by mouth 2 (two) times daily. 60 tablet 3  . HYDROMET 5-1.5 MG/5ML syrup TK 5 ML PO Q 6 H  PRN COU  0  . lidocaine-prilocaine (EMLA) cream Apply 1 application topically as needed. 30 g 0  . oxyCODONE (OXY IR/ROXICODONE) 5 MG immediate release tablet Take 1-2 tablets (5-10 mg total) by mouth every 4 (four) hours as needed for severe pain. 30 tablet 0  . prochlorperazine (COMPAZINE) 10 MG tablet TAKE 1 TABLET BY MOUTH EVERY 6 HOURS AS NEEDED FOR NAUSEA OR VOMITING  0   No current facility-administered medications for this visit.    SURGICAL HISTORY:  Past Surgical History  Procedure Laterality Date  . Video bronchoscopy Bilateral 01/22/2015    Procedure: VIDEO BRONCHOSCOPY WITH FLUORO;  Surgeon: Marshell Garfinkel, MD;  Location: [place];  Service: Cardiopulmonary;  Laterality: Bilateral;  . Supraclavical node biopsy Right 03/15/2015    Procedure: RIGHT SUPRACLAVICAL/CERVICAL LYMPH NODE BIOPSY;  Surgeon: [name], MD;  Location: [place];  Service: Thoracic;  Laterality: Right;  . Scalene node biopsy Right 03/15/2015    Procedure: BIOPSY SCALENE NODE;  Surgeon: [name], MD;  Location: [place];  Service: Thoracic;  Laterality: Right;  . Cardiac catheterization N/A 04/01/2015    Procedure: Pericardiocentesis;  Surgeon: Sherren Mocha, MD;  Location: [place] [facility];  Service: Cardiovascular;  Laterality: N/A;  . Subxyphoid pericardial window  04/29/2015  . Subxyphoid pericardial window N/A 04/29/2015    Procedure: SUBXYPHOID PERICARDIAL WINDOW;  Surgeon: [name], MD;  Location: [place];  Service: Thoracic;  Laterality: N/A;  . [place] without cardioversion N/A 04/29/2015    Procedure: TRANSESOPHAGEAL ECHOCARDIOGRAM (TEE);  Surgeon: [name], MD;  Location: [place];  Service: Thoracic;  Laterality: N/A;  REVIEW OF SYSTEMS:  A comprehensive review of systems was negative.   PHYSICAL EXAMINATION: General appearance: alert, cooperative, fatigued and no distress Head: Normocephalic, without obvious abnormality, atraumatic Neck: marked anterior  cervical adenopathy and thyroid not enlarged, symmetric, no tenderness/mass/nodules Lymph nodes: Large right cervical and supraclavicular as well as a right axillary lymphadenopathy Resp: wheezes RUL Back: symmetric, no curvature. ROM normal. No CVA tenderness. Cardio: regular rate and rhythm, S1, S2 normal, no murmur, click, rub or gallop GI: soft, non-tender; bowel sounds normal; no masses,  no organomegaly Extremities: extremities normal, atraumatic, no cyanosis or edema Neurologic: Alert and oriented X 3, normal strength and tone. Normal symmetric reflexes. Normal coordination and gait  ECOG PERFORMANCE STATUS: 1 - Symptomatic but completely ambulatory  There were no vitals taken for this visit.  LABORATORY DATA: Lab Results  Component Value Date   WBC 3.1* 05/04/2015   HGB 9.9* 05/04/2015   HCT 32.5* 05/04/2015   MCV 76.7* 05/04/2015   PLT 544* 05/04/2015      Chemistry      Component Value Date/Time   NA 144 05/04/2015 1054   NA 140 05/01/2015 0300   NA 138 12/18/2013 0848   K 3.8 05/04/2015 1054   K 4.4 05/01/2015 0300   K 4.0 12/18/2013 0848   CL 104 05/01/2015 0300   CL 105 12/18/2013 0848   CO2 28 05/04/2015 1054   CO2 27 05/01/2015 0300   CO2 28 12/18/2013 0848   BUN 7.1 05/04/2015 1054   BUN <5* 05/01/2015 0300   BUN 7 12/18/2013 0848   CREATININE 0.7 05/04/2015 1054   CREATININE 0.58* 05/01/2015 0300   CREATININE 0.93 12/18/2013 0848      Component Value Date/Time   CALCIUM 9.5 05/04/2015 1054   CALCIUM 9.1 05/01/2015 0300   CALCIUM 9.0 12/18/2013 0848   ALKPHOS 152* 05/04/2015 1054   ALKPHOS 151* 05/01/2015 0300   AST 17 05/04/2015 1054   AST 11* 05/01/2015 0300   ALT 22 05/04/2015 1054   ALT 21 05/01/2015 0300   BILITOT <0.30 05/04/2015 1054   BILITOT 0.2* 05/01/2015 0300       RADIOGRAPHIC STUDIES: Dg Chest 2 View  05/01/2015  CLINICAL DATA:  20 year old male with admission 04/26/2015 and history of Hodgkin's lymphoma EXAM: CHEST - 2 VIEW  COMPARISON:  05/01/2015, PET-CT 03/31/2015 FINDINGS: Cardiomediastinal silhouette unchanged. Opacity overlying the right hilar region is unchanged. Blunting of the costophrenic sulcus on the lateral view. Cardiomediastinal silhouette is unchanged from the comparison, with interval removal of the mediastinal drain. No pneumothorax. Unchanged position of right IJ approach port catheter. No confluent airspace disease. Unremarkable appearance of the upper abdomen. IMPRESSION: Interval removal of mediastinal drain with unchanged configuration of the cardiomediastinal silhouette. Evidence of small pleural effusion on the lateral view. Similar appearance of opacity at the right hilar region, corresponding to known cavitary lesion/mediastinal nodes, better characterized on prior CT. Unchanged port catheter. Signed, Dulcy Fanny. Earleen Newport, DO [facility] Radiology Electronically Signed   By: Corrie Mckusick D.O.   On: 05/01/2015 11:55   Dg Chest 2 View  04/29/2015  CLINICAL DATA:  Pericardial effusion. EXAM: CHEST  2 VIEW COMPARISON:  March 15, 2015.  PET scan of March 30, 2015. FINDINGS: Mild cardiomegaly is noted consistent with pericardial effusion as described on prior PET scan. No pneumothorax or pleural effusion is noted. Interval placement of right subclavian Port-A-Cath with distal tip in expected position of the SVC. Stable right upper lobe opacity is  noted. Left lung is clear. Bony thorax is unremarkable. IMPRESSION: Interval placement of right subclavian Port-A-Cath. Stable right upper lobe opacity is noted. Electronically Signed   By: Marijo Conception, M.D.   On: 04/29/2015 10:48   Dg Chest Port 1 View  05/01/2015  CLINICAL DATA:  Patient with history of pneumothorax. EXAM: PORTABLE CHEST 1 VIEW COMPARISON:  Chest radiograph 04/30/2015. FINDINGS: Right chest wall Port-A-Cath is present with tip projecting over the superior vena cava, stable. Stable cardiac and  mediastinal contours. Unchanged right hilar masslike opacity. Minimal linear opacities left lung base. No pleural effusion or pneumothorax. IMPRESSION: Left basilar atelectasis. Electronically Signed   By: Lovey Newcomer M.D.   On: 05/01/2015 08:38   Dg Chest Port 1 View  04/30/2015  CLINICAL DATA:  Pneumothorax.  Pericardial drain in place. EXAM: PORTABLE CHEST 1 VIEW COMPARISON:  04/29/2015 FINDINGS: Power port in place. Pericardial drain in place. Increased density at the left lung base with a walls of the silhouette of the left hemi diaphragm is consistent with a small left pleural effusion and/or atelectasis/ consolidation in the left lower lobe. Right hilar lung mass is again noted. IMPRESSION: No pneumothorax. New increased density at the left lung base consistent with left effusion or atelectasis/ consolidation at the left lung base. Electronically Signed   By: Lorriane Shire M.D.   On: 04/30/2015 07:51   Dg Chest Port 1 View  04/29/2015  CLINICAL DATA:  Post pericardial window. Rule out pneumothorax. Chest pain EXAM: PORTABLE CHEST 1 VIEW COMPARISON:  04/29/2015 FINDINGS: Negative for pneumothorax Right upper lobe cavitary mass unchanged.  No pleural effusion. Interval placement of pericardial catheter. Port-A-Cath tip in the lower SVC unchanged. IMPRESSION: Interval placement of pericardial catheter.  No pneumothorax Right upper lobe cavitary mass unchanged. Electronically Signed   By: Franchot Gallo M.D.   On: 04/29/2015 14:41    ASSESSMENT AND PLAN: This is a very pleasant 20 years old African-American male recently diagnosed with probably stage III nodular sclerosing Hodgkin lymphoma with bulky lymphadenopathy in the right lower cervical, right supraclavicular, right axillary as well as mediastinal lymph nodes in addition to cavitary lung masses and splenic involvement diagnosed in March 2017. He is currently on systemic chemotherapy with ABVD status post 1 cycle.  He tolerated the treatment  well. I recommended for him to proceed with day 1 of cycle #2 today as a scheduled. He would come back for follow-up visit in 2 weeks for reevaluation and management of any adverse effect of his treatment before starting day 15 of cycle #2. He was advised to call immediately if he has any concerning symptoms in the interval. The patient voices understanding of current disease status and treatment options and is in agreement with the current care plan.  All questions were answered. The patient knows to call the clinic with any problems, questions or concerns. We can certainly see the patient much sooner if necessary.  Disclaimer: This note was dictated with voice recognition software. Similar sounding words can inadvertently be transcribed and may not be corrected upon review.

## 2015-05-25 ENCOUNTER — Ambulatory Visit: Payer: Self-pay | Admitting: Thoracic Surgery (Cardiothoracic Vascular Surgery)

## 2015-05-25 ENCOUNTER — Other Ambulatory Visit: Payer: Medicaid Other

## 2015-05-25 ENCOUNTER — Other Ambulatory Visit: Payer: Self-pay | Admitting: *Deleted

## 2015-05-25 DIAGNOSIS — C8112 Nodular sclerosis classical Hodgkin lymphoma, intrathoracic lymph nodes: Secondary | ICD-10-CM

## 2015-06-01 ENCOUNTER — Telehealth: Payer: Self-pay | Admitting: *Deleted

## 2015-06-01 ENCOUNTER — Ambulatory Visit: Payer: Medicaid Other

## 2015-06-01 ENCOUNTER — Telehealth: Payer: Self-pay | Admitting: Internal Medicine

## 2015-06-01 ENCOUNTER — Ambulatory Visit: Payer: Medicaid Other | Admitting: Internal Medicine

## 2015-06-01 NOTE — Telephone Encounter (Signed)
Reviewed with MD, called pt notified him of r/s appt today to be here at 1:45 for 2pm labs, MD/Infusion. Pt verbalized and confirmed appt today and time. Confirmed he will be here. No further concerns.

## 2015-06-01 NOTE — Telephone Encounter (Signed)
spoke w/ pt confirmed 6/2 apt

## 2015-06-01 NOTE — Telephone Encounter (Signed)
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 Pt requesting r/s lab/MD/Chemo for this afternoon or wed/thurs/fri. Discussed with pt he will need to see provider prior to treatment, appt for today will be cancelled per pt request, POF to scheduling. Pt to expect a call from scheduling with new appt date/times.

## 2015-06-01 NOTE — Telephone Encounter (Signed)
Pt sched adjuisted to mondays every 2 weeks per dr Julien Nordmann.

## 2015-06-04 ENCOUNTER — Telehealth: Payer: Self-pay | Admitting: Internal Medicine

## 2015-06-04 ENCOUNTER — Ambulatory Visit (HOSPITAL_BASED_OUTPATIENT_CLINIC_OR_DEPARTMENT_OTHER): Payer: Medicaid Other

## 2015-06-04 ENCOUNTER — Other Ambulatory Visit: Payer: Self-pay | Admitting: *Deleted

## 2015-06-04 ENCOUNTER — Ambulatory Visit (HOSPITAL_COMMUNITY)
Admission: RE | Admit: 2015-06-04 | Discharge: 2015-06-04 | Disposition: A | Discharge status: Home / Self Care | Payer: MEDICAID | Source: Ambulatory Visit | Attending: Internal Medicine | Admitting: Internal Medicine

## 2015-06-04 ENCOUNTER — Other Ambulatory Visit: Payer: Self-pay | Admitting: Medical Oncology

## 2015-06-04 ENCOUNTER — Ambulatory Visit: Payer: Medicaid Other | Admitting: Nurse Practitioner

## 2015-06-04 ENCOUNTER — Ambulatory Visit (HOSPITAL_BASED_OUTPATIENT_CLINIC_OR_DEPARTMENT_OTHER): Payer: Self-pay

## 2015-06-04 ENCOUNTER — Encounter: Payer: Self-pay | Admitting: Medical Oncology

## 2015-06-04 ENCOUNTER — Ambulatory Visit (HOSPITAL_BASED_OUTPATIENT_CLINIC_OR_DEPARTMENT_OTHER): Payer: Self-pay | Admitting: Internal Medicine

## 2015-06-04 ENCOUNTER — Encounter: Payer: Self-pay | Admitting: Internal Medicine

## 2015-06-04 VITALS — BP 128/80 | HR 65 | Temp 97.6°F | Resp 17 | Ht 70.0 in | Wt 171.9 lb

## 2015-06-04 DIAGNOSIS — C8112 Nodular sclerosis classical Hodgkin lymphoma, intrathoracic lymph nodes: Secondary | ICD-10-CM

## 2015-06-04 DIAGNOSIS — G893 Neoplasm related pain (acute) (chronic): Secondary | ICD-10-CM

## 2015-06-04 DIAGNOSIS — Z5111 Encounter for antineoplastic chemotherapy: Secondary | ICD-10-CM

## 2015-06-04 DIAGNOSIS — Z5189 Encounter for other specified aftercare: Secondary | ICD-10-CM

## 2015-06-04 DIAGNOSIS — D701 Agranulocytosis secondary to cancer chemotherapy: Secondary | ICD-10-CM

## 2015-06-04 DIAGNOSIS — C8190 Hodgkin lymphoma, unspecified, unspecified site: Secondary | ICD-10-CM | POA: Insufficient documentation

## 2015-06-04 DIAGNOSIS — C8118 Nodular sclerosis classical Hodgkin lymphoma, lymph nodes of multiple sites: Secondary | ICD-10-CM

## 2015-06-04 DIAGNOSIS — T451X5A Adverse effect of antineoplastic and immunosuppressive drugs, initial encounter: Secondary | ICD-10-CM | POA: Insufficient documentation

## 2015-06-04 DIAGNOSIS — Z452 Encounter for adjustment and management of vascular access device: Secondary | ICD-10-CM | POA: Insufficient documentation

## 2015-06-04 HISTORY — DX: Adverse effect of antineoplastic and immunosuppressive drugs, initial encounter: T45.1X5A

## 2015-06-04 HISTORY — DX: Adverse effect of antineoplastic and immunosuppressive drugs, initial encounter: D70.1

## 2015-06-04 LAB — CBC WITH DIFFERENTIAL/PLATELET
BASO%: 2.4 % — AB (ref 0.0–2.0)
BASOS ABS: 0.1 10*3/uL (ref 0.0–0.1)
EOS%: 4.4 % (ref 0.0–7.0)
Eosinophils Absolute: 0.1 10*3/uL (ref 0.0–0.5)
HCT: 39.1 % (ref 38.4–49.9)
HEMOGLOBIN: 12.5 g/dL — AB (ref 13.0–17.1)
LYMPH%: 45.9 % (ref 14.0–49.0)
MCH: 25.5 pg — AB (ref 27.2–33.4)
MCHC: 31.9 g/dL — AB (ref 32.0–36.0)
MCV: 80 fL (ref 79.3–98.0)
MONO#: 0.5 10*3/uL (ref 0.1–0.9)
MONO%: 16.9 % — AB (ref 0.0–14.0)
NEUT#: 0.9 10*3/uL — ABNORMAL LOW (ref 1.5–6.5)
NEUT%: 30.4 % — AB (ref 39.0–75.0)
Platelets: 252 10*3/uL (ref 140–400)
RBC: 4.89 10*6/uL (ref 4.20–5.82)
RDW: 23.6 % — AB (ref 11.0–14.6)
WBC: 3.1 10*3/uL — ABNORMAL LOW (ref 4.0–10.3)
lymph#: 1.4 10*3/uL (ref 0.9–3.3)

## 2015-06-04 LAB — COMPREHENSIVE METABOLIC PANEL
ALK PHOS: 114 U/L (ref 40–150)
ALT: 9 U/L (ref 0–55)
ANION GAP: 8 meq/L (ref 3–11)
AST: 12 U/L (ref 5–34)
Albumin: 3.5 g/dL (ref 3.5–5.0)
BILIRUBIN TOTAL: 0.37 mg/dL (ref 0.20–1.20)
BUN: 7.4 mg/dL (ref 7.0–26.0)
CO2: 26 meq/L (ref 22–29)
Calcium: 9.2 mg/dL (ref 8.4–10.4)
Chloride: 108 mEq/L (ref 98–109)
Creatinine: 0.8 mg/dL (ref 0.7–1.3)
Glucose: 82 mg/dl (ref 70–140)
Potassium: 3.5 mEq/L (ref 3.5–5.1)
Sodium: 142 mEq/L (ref 136–145)
TOTAL PROTEIN: 7 g/dL (ref 6.4–8.3)

## 2015-06-04 MED ORDER — DOXORUBICIN HCL CHEMO IV INJECTION 2 MG/ML
25.0000 mg/m2 | Freq: Once | INTRAVENOUS | Status: AC
  Administered 2015-06-04: 48 mg via INTRAVENOUS
  Filled 2015-06-04: qty 24

## 2015-06-04 MED ORDER — SODIUM CHLORIDE 0.9% FLUSH
10.0000 mL | INTRAVENOUS | Status: DC | PRN
  Administered 2015-06-04: 10 mL
  Filled 2015-06-04: qty 10

## 2015-06-04 MED ORDER — SODIUM CHLORIDE 0.9 % IV SOLN
375.0000 mg/m2 | Freq: Once | INTRAVENOUS | Status: AC
  Administered 2015-06-04: 720 mg via INTRAVENOUS
  Filled 2015-06-04: qty 36

## 2015-06-04 MED ORDER — SODIUM CHLORIDE 0.9 % IV SOLN
Freq: Once | INTRAVENOUS | Status: AC
  Administered 2015-06-04: 14:00:00 via INTRAVENOUS
  Filled 2015-06-04: qty 5

## 2015-06-04 MED ORDER — SODIUM CHLORIDE 0.9 % IV SOLN
5.7000 mg/m2 | Freq: Once | INTRAVENOUS | Status: AC
  Administered 2015-06-04: 11 mg via INTRAVENOUS
  Filled 2015-06-04: qty 11

## 2015-06-04 MED ORDER — PALONOSETRON HCL INJECTION 0.25 MG/5ML
INTRAVENOUS | Status: AC
  Filled 2015-06-04: qty 5

## 2015-06-04 MED ORDER — IOPAMIDOL (ISOVUE-300) INJECTION 61%
10.0000 mL | Freq: Once | INTRAVENOUS | Status: AC | PRN
  Administered 2015-06-04: 10 mL via INTRAVENOUS

## 2015-06-04 MED ORDER — HEPARIN SOD (PORK) LOCK FLUSH 100 UNIT/ML IV SOLN
500.0000 [IU] | Freq: Once | INTRAVENOUS | Status: AC | PRN
  Administered 2015-06-04: 500 [IU]
  Filled 2015-06-04: qty 5

## 2015-06-04 MED ORDER — PALONOSETRON HCL INJECTION 0.25 MG/5ML
0.2500 mg | Freq: Once | INTRAVENOUS | Status: AC
  Administered 2015-06-04: 0.25 mg via INTRAVENOUS

## 2015-06-04 MED ORDER — SODIUM CHLORIDE 0.9 % IV SOLN
Freq: Once | INTRAVENOUS | Status: AC
  Administered 2015-06-04: 13:00:00 via INTRAVENOUS

## 2015-06-04 MED ORDER — OXYCODONE HCL 5 MG PO TABS: 5.0000 mg | ORAL_TABLET | ORAL | Status: DC | PRN

## 2015-06-04 MED ORDER — PEGFILGRASTIM 6 MG/0.6ML ~~LOC~~ PSKT
6.0000 mg | PREFILLED_SYRINGE | Freq: Once | SUBCUTANEOUS | Status: AC
  Administered 2015-06-04: 6 mg via SUBCUTANEOUS
  Filled 2015-06-04: qty 0.6

## 2015-06-04 MED ORDER — SODIUM CHLORIDE 0.9 % IV SOLN
10.0000 [IU]/m2 | Freq: Once | INTRAVENOUS | Status: AC
  Administered 2015-06-04: 19 [IU] via INTRAVENOUS
  Filled 2015-06-04: qty 6.33

## 2015-06-04 NOTE — Progress Notes (Addendum)
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 Cytology was negative for lymphoma. 2) status post subxiphoid pericardial window under the care of Dr. Roxan Hockey on 04/29/2015  CURRENT THERAPY: Systemic chemotherapy with ABVD. First dose 04/20/2015. Status post 1 cycle and day 1 of cycle #2.  INTERVAL HISTORY: Jeffery Crane 20 y.o. male returns to the clinic today for follow-up visit. He missed his appointment earlier this week because the patient was working night shift and he could not make it to the clinic for his treatment. He is feeling much better today. He tolerated the last dose of his treatment fairly well with no significant adverse effects. He has occasional pain on the right side of the chest.  He denied having any significant fever or chills. He denied having any nausea or vomiting. He denied having any significant shortness of breath and has mild cough with no hemoptysis. He is here today to start day 15 of cycle #2.  MEDICAL HISTORY: Past Medical History  Diagnosis Date  . Adult ADHD   . Pneumonia   . Headache   . GERD (gastroesophageal reflux disease)   . 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 Anemia   . Encounter for antineoplastic chemotherapy 05/04/2015    ALLERGIES:  is allergic to amoxicillin-pot clavulanate and bactrim.  MEDICATIONS:  Current Outpatient Prescriptions  Medication Sig Dispense Refill  . allopurinol (ZYLOPRIM) 100 MG tablet Take 1 tablet (100  mg total) by mouth 2 (two) times daily. 60 tablet 3  . HYDROMET 5-1.5 MG/5ML syrup TK 5 ML PO Q 6 H PRN COU  0  . lidocaine-prilocaine (EMLA) cream Apply 1 application topically as needed. 30 g 0  . oxyCODONE (OXY IR/ROXICODONE) 5 MG immediate release tablet Take 1-2 tablets (5-10 mg total) by mouth every 4 (four) hours as needed for severe pain. 30 tablet 0  . prochlorperazine (COMPAZINE) 10 MG tablet TAKE 1 TABLET BY MOUTH EVERY 6 HOURS AS NEEDED FOR NAUSEA OR VOMITING  0   No current facility-administered medications for this visit.    SURGICAL HISTORY:  Past Surgical History  Procedure Laterality Date  . Video bronchoscopy Bilateral 01/22/2015    Procedure: VIDEO BRONCHOSCOPY WITH FLUORO;  Surgeon: Marshell Garfinkel, MD;  Location: [place];  Service: Cardiopulmonary;  Laterality: Bilateral;  . Supraclavical node biopsy Right 03/15/2015    Procedure: RIGHT SUPRACLAVICAL/CERVICAL LYMPH NODE BIOPSY;  Surgeon: [name], MD;  Location: [place];  Service: Thoracic;  Laterality: Right;  . Scalene node biopsy Right 03/15/2015    Procedure: BIOPSY SCALENE NODE;  Surgeon: [name], MD;  Location: [place];  Service: Thoracic;  Laterality: Right;  . Cardiac catheterization N/A 04/01/2015    Procedure: Pericardiocentesis;  Surgeon: Sherren Mocha, MD;  Location: [place] [facility];  Service: Cardiovascular;  Laterality: N/A;  . Subxyphoid pericardial window  04/29/2015  . Subxyphoid pericardial window N/A 04/29/2015    Procedure: SUBXYPHOID PERICARDIAL WINDOW;  Surgeon: [name], MD;  Location: [place];  Service: Thoracic;  Laterality: N/A;  [place] D[place] without  cardioversion N/A 04/29/2015    Procedure: TRANSESOPHAGEAL ECHOCARDIOGRAM (TEE);  Surgeon: [name], MD;  Location: [place];  Service: Thoracic;  Laterality: N/A;    REVIEW OF SYSTEMS:  A comprehensive review of systems was negative except for: Constitutional: positive for fatigue Respiratory: positive for  pleurisy/chest pain   PHYSICAL EXAMINATION: General appearance: alert, cooperative, fatigued and no distress Head: Normocephalic, without obvious abnormality, atraumatic Neck: marked anterior cervical adenopathy and thyroid not enlarged, symmetric, no tenderness/mass/nodules Lymph nodes: Large right cervical and supraclavicular as well as a right axillary lymphadenopathy Resp: wheezes RUL Back: symmetric, no curvature. ROM normal. No CVA tenderness. Cardio: regular rate and rhythm, S1, S2 normal, no murmur, click, rub or gallop GI: soft, non-tender; bowel sounds normal; no masses,  no organomegaly Extremities: extremities normal, atraumatic, no cyanosis or edema Neurologic: Alert and oriented X 3, normal strength and tone. Normal symmetric reflexes. Normal coordination and gait    Skin marks on the chest, back, arms and legs.  ECOG PERFORMANCE STATUS: 1 - Symptomatic but completely ambulatory  Blood pressure 128/80, pulse 65, temperature 97.6 F (36.4 C), temperature source Oral, resp. rate 17, height 5\' 10"  (1.778 m), weight 171 lb 14.4 oz (77.973 kg), SpO2 100 %.  LABORATORY DATA: Lab Results  Component Value Date   WBC 3.1* 06/04/2015   HGB 12.5* 06/04/2015   HCT 39.1 06/04/2015   MCV 80.0 06/04/2015   PLT 252 06/04/2015      Chemistry      Component Value Date/Time   NA 139 05/18/2015 1111   NA 140 05/01/2015 0300   NA 138 12/18/2013 0848   K 4.3 05/18/2015 1111   K 4.4 05/01/2015 0300   K 4.0 12/18/2013 0848   CL 104 05/01/2015 0300   CL 105 12/18/2013 0848   CO2 28 05/18/2015 1111   CO2 27 05/01/2015 0300   CO2 28 12/18/2013 0848   BUN 8.1 05/18/2015 1111   BUN <5* 05/01/2015 0300   BUN 7 12/18/2013 0848   CREATININE 0.8 05/18/2015 1111   CREATININE 0.58* 05/01/2015 0300   CREATININE 0.93 12/18/2013 0848      Component Value Date/Time   CALCIUM 9.9 05/18/2015 1111   CALCIUM 9.1 05/01/2015 0300   CALCIUM 9.0 12/18/2013 0848   ALKPHOS 160* 05/18/2015 1111     ALKPHOS 151* 05/01/2015 0300   AST 15 05/18/2015 1111   AST 11* 05/01/2015 0300   ALT 22 05/18/2015 1111   ALT 21 05/01/2015 0300   BILITOT <0.30 05/18/2015 1111   BILITOT 0.2* 05/01/2015 0300       RADIOGRAPHIC STUDIES: No results found.  ASSESSMENT AND PLAN: This is a very pleasant 20 years old African-American male recently diagnosed with probably stage III nodular sclerosing Hodgkin lymphoma with bulky lymphadenopathy in the right lower cervical, right supraclavicular, right axillary as well as mediastinal lymph nodes in addition to cavitary lung masses and splenic involvement diagnosed in March 2017. He is currently on systemic chemotherapy with ABVD status post 1 cycle.  He tolerated the treatment well. I recommended for him to proceed with day 15 of cycle #2 today as a scheduled. Because of the chemotherapy-induced neutropenia, I will arrange for the patient to receive Neulasta injection after his treatment today. He would come back for follow-up visit in 2 weeks for reevaluation and management of any adverse effect of his treatment before starting cycle #3. For pain management, he was given prescription for oxycodone. He was advised to call immediately if he has  any concerning symptoms in the interval. The patient voices understanding of current disease status and treatment options and is in agreement with the current care plan.  All questions were answered. The patient knows to call the clinic with any problems, questions or concerns. We can certainly see the patient much sooner if necessary.  Disclaimer: This note was dictated with voice recognition software. Similar sounding words can inadvertently be transcribed and may not be corrected upon review.

## 2015-06-04 NOTE — Patient Instructions (Signed)
Smoking Cessation, Tips for Success If you are ready to quit smoking, congratulations! You have chosen to help yourself be healthier. Cigarettes bring nicotine, tar, carbon monoxide, and other irritants into your body. Your lungs, heart, and blood vessels will be able to work better without these poisons. There are many different ways to quit smoking. Nicotine gum, nicotine patches, a nicotine inhaler, or nicotine nasal spray can help with physical craving. Hypnosis, support groups, and medicines help break the habit of smoking. WHAT THINGS CAN I DO TO MAKE QUITTING EASIER?  Here are some tips to help you quit for good:  Pick a date when you will quit smoking completely. Tell all of your friends and family about your plan to quit on that date.  Do not try to slowly cut down on the number of cigarettes you are smoking. Pick a quit date and quit smoking completely starting on that day.  Throw away all cigarettes.   Clean and remove all ashtrays from your home, work, and car.  On a card, write down your reasons for quitting. Carry the card with you and read it when you get the urge to smoke.  Cleanse your body of nicotine. Drink enough water and fluids to keep your urine clear or pale yellow. Do this after quitting to flush the nicotine from your body.  Learn to predict your moods. Do not let a bad situation be your excuse to have a cigarette. Some situations in your life might tempt you into wanting a cigarette.  Never have "just one" cigarette. It leads to wanting another and another. Remind yourself of your decision to quit.  Change habits associated with smoking. If you smoked while driving or when feeling stressed, try other activities to replace smoking. Stand up when drinking your coffee. Brush your teeth after eating. Sit in a different chair when you read the paper. Avoid alcohol while trying to quit, and try to drink fewer caffeinated beverages. Alcohol and caffeine may urge you to  smoke.  Avoid foods and drinks that can trigger a desire to smoke, such as sugary or spicy foods and alcohol.  Ask people who smoke not to smoke around you.  Have something planned to do right after eating or having a cup of coffee. For example, plan to take a walk or exercise.  Try a relaxation exercise to calm you down and decrease your stress. Remember, you may be tense and nervous for the first 2 weeks after you quit, but this will pass.  Find new activities to keep your hands busy. Play with a pen, coin, or rubber band. Doodle or draw things on paper.  Brush your teeth right after eating. This will help cut down on the craving for the taste of tobacco after meals. You can also try mouthwash.   Use oral substitutes in place of cigarettes. Try using lemon drops, carrots, cinnamon sticks, or chewing gum. Keep them handy so they are available when you have the urge to smoke.  When you have the urge to smoke, try deep breathing.  Designate your home as a nonsmoking area.  If you are a heavy smoker, ask your health care provider about a prescription for nicotine chewing gum. It can ease your withdrawal from nicotine.  Reward yourself. Set aside the cigarette money you save and buy yourself something nice.  Look for support from others. Join a support group or smoking cessation program. Ask someone at home or at work to help you with your plan   to quit smoking.  Always ask yourself, "Do I need this cigarette or is this just a reflex?" Tell yourself, "Today, I choose not to smoke," or "I do not want to smoke." You are reminding yourself of your decision to quit.  Do not replace cigarette smoking with electronic cigarettes (commonly called e-cigarettes). The safety of e-cigarettes is unknown, and some may contain harmful chemicals.  If you relapse, do not give up! Plan ahead and think about what you will do the next time you get the urge to smoke. HOW WILL I FEEL WHEN I QUIT SMOKING? You  may have symptoms of withdrawal because your body is used to nicotine (the addictive substance in cigarettes). You may crave cigarettes, be irritable, feel very hungry, cough often, get headaches, or have difficulty concentrating. The withdrawal symptoms are only temporary. They are strongest when you first quit but will go away within 10-14 days. When withdrawal symptoms occur, stay in control. Think about your reasons for quitting. Remind yourself that these are signs that your body is healing and getting used to being without cigarettes. Remember that withdrawal symptoms are easier to treat than the major diseases that smoking can cause.  Even after the withdrawal is over, expect periodic urges to smoke. However, these cravings are generally short lived and will go away whether you smoke or not. Do not smoke! WHAT RESOURCES ARE AVAILABLE TO HELP ME QUIT SMOKING? Your health care provider can direct you to community resources or hospitals for support, which may include:  Group support.  Education.  Hypnosis.  Therapy.   This information is not intended to replace advice given to you by your health care provider. Make sure you discuss any questions you have with your health care provider.   Document Released: 09/17/2003 Document Revised: 01/09/2014 Document Reviewed: 06/06/2012 Elsevier Interactive Patient Education 2016 [employer]vier Inc.  

## 2015-06-04 NOTE — Progress Notes (Signed)
Per Anderson Malta in IR per Dr. Kathlene Cote port is OK to use.

## 2015-06-04 NOTE — Telephone Encounter (Signed)
Pt will p/u sched in tx room °

## 2015-06-04 NOTE — Progress Notes (Signed)
Work excuse letter given to pt.

## 2015-06-04 NOTE — Patient Instructions (Signed)
[facility] Discharge Instructions for Patients Receiving Chemotherapy  Today you received the following chemotherapy agents:  Adriamycin, velban, bleomycin, DTIC  To help prevent nausea and vomiting after your treatment, we encourage you to take your nausea medication. If you develop nausea and vomiting that is not controlled by your nausea medication, call the clinic.   BELOW ARE SYMPTOMS THAT SHOULD BE REPORTED IMMEDIATELY:  *FEVER GREATER THAN 100.5 F  *CHILLS WITH OR WITHOUT FEVER  NAUSEA AND VOMITING THAT IS NOT CONTROLLED WITH YOUR NAUSEA MEDICATION  *UNUSUAL SHORTNESS OF BREATH  *UNUSUAL BRUISING OR BLEEDING  TENDERNESS IN MOUTH AND THROAT WITH OR WITHOUT PRESENCE OF ULCERS  *URINARY PROBLEMS  *BOWEL PROBLEMS  UNUSUAL RASH Items with * indicate a potential emergency and should be followed up as soon as possible.  Feel free to call the clinic you have any questions or concerns. The clinic phone number is [phone].  Please show the [place] [facility] at check-in to the Emergency Department and triage nurse.

## 2015-06-04 NOTE — Progress Notes (Signed)
Per Julien Nordmann is is okay to treat pt today with chemotherapy and today 's lab results.

## 2015-06-04 NOTE — Progress Notes (Signed)
Pt c/o burning with NS flush above port.  Also c/o burning with NS infusing. Excellent blood return. Stopped IV & order received for dye study per Dr Julien Nordmann & sent to radiology @ 1250 am.

## 2015-06-07 ENCOUNTER — Other Ambulatory Visit (HOSPITAL_COMMUNITY): Payer: Medicaid Other

## 2015-06-15 ENCOUNTER — Other Ambulatory Visit: Payer: Medicaid Other

## 2015-06-15 ENCOUNTER — Ambulatory Visit: Payer: Medicaid Other | Admitting: Internal Medicine

## 2015-06-15 ENCOUNTER — Ambulatory Visit: Payer: Medicaid Other

## 2015-06-17 ENCOUNTER — Other Ambulatory Visit: Payer: Self-pay | Admitting: Medical Oncology

## 2015-06-17 DIAGNOSIS — C8112 Nodular sclerosis classical Hodgkin lymphoma, intrathoracic lymph nodes: Secondary | ICD-10-CM

## 2015-06-21 ENCOUNTER — Ambulatory Visit (HOSPITAL_BASED_OUTPATIENT_CLINIC_OR_DEPARTMENT_OTHER): Payer: Self-pay

## 2015-06-21 ENCOUNTER — Encounter: Payer: Self-pay | Admitting: Internal Medicine

## 2015-06-21 ENCOUNTER — Telehealth: Payer: Self-pay | Admitting: Internal Medicine

## 2015-06-21 ENCOUNTER — Other Ambulatory Visit (HOSPITAL_BASED_OUTPATIENT_CLINIC_OR_DEPARTMENT_OTHER): Payer: Self-pay

## 2015-06-21 ENCOUNTER — Ambulatory Visit (HOSPITAL_BASED_OUTPATIENT_CLINIC_OR_DEPARTMENT_OTHER): Payer: Self-pay | Admitting: Internal Medicine

## 2015-06-21 VITALS — BP 132/83 | HR 85 | Temp 98.3°F | Resp 18 | Ht 70.0 in | Wt 175.3 lb

## 2015-06-21 DIAGNOSIS — C8112 Nodular sclerosis classical Hodgkin lymphoma, intrathoracic lymph nodes: Secondary | ICD-10-CM

## 2015-06-21 DIAGNOSIS — T451X5A Adverse effect of antineoplastic and immunosuppressive drugs, initial encounter: Secondary | ICD-10-CM

## 2015-06-21 DIAGNOSIS — Z5189 Encounter for other specified aftercare: Secondary | ICD-10-CM

## 2015-06-21 DIAGNOSIS — Z5111 Encounter for antineoplastic chemotherapy: Secondary | ICD-10-CM

## 2015-06-21 DIAGNOSIS — D701 Agranulocytosis secondary to cancer chemotherapy: Secondary | ICD-10-CM

## 2015-06-21 LAB — CBC WITH DIFFERENTIAL/PLATELET
BASO%: 1.3 % (ref 0.0–2.0)
BASOS ABS: 0.1 10*3/uL (ref 0.0–0.1)
EOS%: 2.1 % (ref 0.0–7.0)
Eosinophils Absolute: 0.1 10*3/uL (ref 0.0–0.5)
HCT: 42.5 % (ref 38.4–49.9)
HEMOGLOBIN: 13.7 g/dL (ref 13.0–17.1)
LYMPH%: 46.8 % (ref 14.0–49.0)
MCH: 26.5 pg — AB (ref 27.2–33.4)
MCHC: 32.2 g/dL (ref 32.0–36.0)
MCV: 82.2 fL (ref 79.3–98.0)
MONO#: 0.8 10*3/uL (ref 0.1–0.9)
MONO%: 21.6 % — AB (ref 0.0–14.0)
NEUT%: 28.2 % — ABNORMAL LOW (ref 39.0–75.0)
NEUTROS ABS: 1.1 10*3/uL — AB (ref 1.5–6.5)
Platelets: 187 10*3/uL (ref 140–400)
RBC: 5.17 10*6/uL (ref 4.20–5.82)
RDW: 20.8 % — AB (ref 11.0–14.6)
WBC: 3.9 10*3/uL — AB (ref 4.0–10.3)
lymph#: 1.8 10*3/uL (ref 0.9–3.3)

## 2015-06-21 LAB — COMPREHENSIVE METABOLIC PANEL
ALBUMIN: 3.6 g/dL (ref 3.5–5.0)
ALK PHOS: 98 U/L (ref 40–150)
ALT: 16 U/L (ref 0–55)
AST: 14 U/L (ref 5–34)
Anion Gap: 8 mEq/L (ref 3–11)
BUN: 11.1 mg/dL (ref 7.0–26.0)
CO2: 28 mEq/L (ref 22–29)
Calcium: 9.2 mg/dL (ref 8.4–10.4)
Chloride: 105 mEq/L (ref 98–109)
Creatinine: 0.9 mg/dL (ref 0.7–1.3)
EGFR: >90 mL/min/{1.73_m2} (ref >90)
GLUCOSE: 95 mg/dL (ref 70–140)
POTASSIUM: 3.9 meq/L (ref 3.5–5.1)
SODIUM: 141 meq/L (ref 136–145)
Total Bilirubin: 0.3 mg/dL (ref 0.20–1.20)
Total Protein: 7.1 g/dL (ref 6.4–8.3)

## 2015-06-21 MED ORDER — PALONOSETRON HCL INJECTION 0.25 MG/5ML
0.2500 mg | Freq: Once | INTRAVENOUS | Status: AC
  Administered 2015-06-21: 0.25 mg via INTRAVENOUS

## 2015-06-21 MED ORDER — PEGFILGRASTIM 6 MG/0.6ML ~~LOC~~ PSKT
6.0000 mg | PREFILLED_SYRINGE | Freq: Once | SUBCUTANEOUS | Status: AC
  Administered 2015-06-21: 6 mg via SUBCUTANEOUS
  Filled 2015-06-21: qty 0.6

## 2015-06-21 MED ORDER — DOXORUBICIN HCL CHEMO IV INJECTION 2 MG/ML
25.0000 mg/m2 | Freq: Once | INTRAVENOUS | Status: AC
  Administered 2015-06-21: 48 mg via INTRAVENOUS
  Filled 2015-06-21: qty 24

## 2015-06-21 MED ORDER — PALONOSETRON HCL INJECTION 0.25 MG/5ML
INTRAVENOUS | Status: AC
  Filled 2015-06-21: qty 5

## 2015-06-21 MED ORDER — FOSAPREPITANT DIMEGLUMINE INJECTION 150 MG
Freq: Once | INTRAVENOUS | Status: AC
  Administered 2015-06-21: 13:00:00 via INTRAVENOUS
  Filled 2015-06-21: qty 5

## 2015-06-21 MED ORDER — SODIUM CHLORIDE 0.9 % IV SOLN
10.0000 [IU]/m2 | Freq: Once | INTRAVENOUS | Status: AC
  Administered 2015-06-21: 19 [IU] via INTRAVENOUS
  Filled 2015-06-21: qty 6.33

## 2015-06-21 MED ORDER — SODIUM CHLORIDE 0.9 % IV SOLN
375.0000 mg/m2 | Freq: Once | INTRAVENOUS | Status: AC
  Administered 2015-06-21: 720 mg via INTRAVENOUS
  Filled 2015-06-21: qty 36

## 2015-06-21 MED ORDER — SODIUM CHLORIDE 0.9 % IV SOLN
Freq: Once | INTRAVENOUS | Status: AC
  Administered 2015-06-21: 13:00:00 via INTRAVENOUS

## 2015-06-21 MED ORDER — HEPARIN SOD (PORK) LOCK FLUSH 100 UNIT/ML IV SOLN
500.0000 [IU] | Freq: Once | INTRAVENOUS | Status: AC | PRN
  Administered 2015-06-21: 500 [IU]
  Filled 2015-06-21: qty 5

## 2015-06-21 MED ORDER — SODIUM CHLORIDE 0.9% FLUSH
10.0000 mL | INTRAVENOUS | Status: DC | PRN
  Administered 2015-06-21: 10 mL
  Filled 2015-06-21: qty 10

## 2015-06-21 MED ORDER — SODIUM CHLORIDE 0.9 % IV SOLN
5.7000 mg/m2 | Freq: Once | INTRAVENOUS | Status: AC
  Administered 2015-06-21: 11 mg via INTRAVENOUS
  Filled 2015-06-21: qty 11

## 2015-06-21 NOTE — Telephone Encounter (Signed)
Gave and printed appt sched anda vs for pt for July and Aug....the patient wanted to be back on tuesdays

## 2015-06-21 NOTE — Progress Notes (Signed)
Spoke with Dianne RN just before pre meds at 1250 and she stated Dr. Julien Nordmann told her pt was ok to treat with [place] since he will have ONPRO today. Again, this was clarified by [facility] RN

## 2015-06-21 NOTE — Patient Instructions (Signed)
Smoking Cessation, Tips for Success If you are ready to quit smoking, congratulations! You have chosen to help yourself be healthier. Cigarettes bring nicotine, tar, carbon monoxide, and other irritants into your body. Your lungs, heart, and blood vessels will be able to work better without these poisons. There are many different ways to quit smoking. Nicotine gum, nicotine patches, a nicotine inhaler, or nicotine nasal spray can help with physical craving. Hypnosis, support groups, and medicines help break the habit of smoking. WHAT THINGS CAN I DO TO MAKE QUITTING EASIER?  Here are some tips to help you quit for good:  Pick a date when you will quit smoking completely. Tell all of your friends and family about your plan to quit on that date.  Do not try to slowly cut down on the number of cigarettes you are smoking. Pick a quit date and quit smoking completely starting on that day.  Throw away all cigarettes.   Clean and remove all ashtrays from your home, work, and car.  On a card, write down your reasons for quitting. Carry the card with you and read it when you get the urge to smoke.  Cleanse your body of nicotine. Drink enough water and fluids to keep your urine clear or pale yellow. Do this after quitting to flush the nicotine from your body.  Learn to predict your moods. Do not let a bad situation be your excuse to have a cigarette. Some situations in your life might tempt you into wanting a cigarette.  Never have "just one" cigarette. It leads to wanting another and another. Remind yourself of your decision to quit.  Change habits associated with smoking. If you smoked while driving or when feeling stressed, try other activities to replace smoking. Stand up when drinking your coffee. Brush your teeth after eating. Sit in a different chair when you read the paper. Avoid alcohol while trying to quit, and try to drink fewer caffeinated beverages. Alcohol and caffeine may urge you to  smoke.  Avoid foods and drinks that can trigger a desire to smoke, such as sugary or spicy foods and alcohol.  Ask people who smoke not to smoke around you.  Have something planned to do right after eating or having a cup of coffee. For example, plan to take a walk or exercise.  Try a relaxation exercise to calm you down and decrease your stress. Remember, you may be tense and nervous for the first 2 weeks after you quit, but this will pass.  Find new activities to keep your hands busy. Play with a pen, coin, or rubber band. Doodle or draw things on paper.  Brush your teeth right after eating. This will help cut down on the craving for the taste of tobacco after meals. You can also try mouthwash.   Use oral substitutes in place of cigarettes. Try using lemon drops, carrots, cinnamon sticks, or chewing gum. Keep them handy so they are available when you have the urge to smoke.  When you have the urge to smoke, try deep breathing.  Designate your home as a nonsmoking area.  If you are a heavy smoker, ask your health care provider about a prescription for nicotine chewing gum. It can ease your withdrawal from nicotine.  Reward yourself. Set aside the cigarette money you save and buy yourself something nice.  Look for support from others. Join a support group or smoking cessation program. Ask someone at home or at work to help you with your plan   to quit smoking.  Always ask yourself, "Do I need this cigarette or is this just a reflex?" Tell yourself, "Today, I choose not to smoke," or "I do not want to smoke." You are reminding yourself of your decision to quit.  Do not replace cigarette smoking with electronic cigarettes (commonly called e-cigarettes). The safety of e-cigarettes is unknown, and some may contain harmful chemicals.  If you relapse, do not give up! Plan ahead and think about what you will do the next time you get the urge to smoke. HOW WILL I FEEL WHEN I QUIT SMOKING? You  may have symptoms of withdrawal because your body is used to nicotine (the addictive substance in cigarettes). You may crave cigarettes, be irritable, feel very hungry, cough often, get headaches, or have difficulty concentrating. The withdrawal symptoms are only temporary. They are strongest when you first quit but will go away within 10-14 days. When withdrawal symptoms occur, stay in control. Think about your reasons for quitting. Remind yourself that these are signs that your body is healing and getting used to being without cigarettes. Remember that withdrawal symptoms are easier to treat than the major diseases that smoking can cause.  Even after the withdrawal is over, expect periodic urges to smoke. However, these cravings are generally short lived and will go away whether you smoke or not. Do not smoke! WHAT RESOURCES ARE AVAILABLE TO HELP ME QUIT SMOKING? Your health care provider can direct you to community resources or hospitals for support, which may include:  Group support.  Education.  Hypnosis.  Therapy.   This information is not intended to replace advice given to you by your health care provider. Make sure you discuss any questions you have with your health care provider.   Document Released: 09/17/2003 Document Revised: 01/09/2014 Document Reviewed: 06/06/2012 Elsevier Interactive Patient Education 2016 [employer]vier Inc.  

## 2015-06-21 NOTE — Progress Notes (Signed)
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 Cytology was negative for lymphoma. 2) status post subxiphoid pericardial window under the care of Dr. Roxan Hockey on 04/29/2015  CURRENT THERAPY: Systemic chemotherapy with ABVD. First dose 04/20/2015. Status post 2 cycles.   INTERVAL HISTORY: Jeffery Crane 20 y.o. male returns to the clinic today for follow-up visit. The patient has no complaints today. He tolerated the last dose of his treatment fairly well with no significant adverse effects. He has occasional pain on the right side of the chest.  He denied having any significant fever or chills. He denied having any nausea or vomiting. He denied having any significant shortness of breath and has mild cough with no hemoptysis. He is here today to start day 1 of cycle #3.  MEDICAL HISTORY: Past Medical History  Diagnosis Date  . Adult ADHD   . Pneumonia   . Headache   . GERD (gastroesophageal reflux disease)   . 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 Anemia   . Encounter for antineoplastic chemotherapy 05/04/2015  . Chemotherapy induced neutropenia ([place]) 06/04/2015    ALLERGIES:  is allergic to amoxicillin-pot clavulanate and bactrim.  MEDICATIONS:  Current Outpatient Prescriptions  Medication Sig Dispense Refill  . oxyCODONE (OXY IR/ROXICODONE) 5 MG immediate release tablet Take 1-2 tablets (5-10 mg total) by mouth every 4 (four) hours as needed for severe pain. 30 tablet 0  .  allopurinol (ZYLOPRIM) 100 MG tablet Take 1 tablet (100 mg total) by mouth 2 (two) times daily. (Patient not taking: Reported on 06/04/2015) 60 tablet 3  . HYDROMET 5-1.5 MG/5ML syrup Reported on 06/21/2015  0  . lidocaine-prilocaine (EMLA) cream Apply 1 application topically as needed. (Patient not taking: Reported on 06/21/2015) 30 g 0  . prochlorperazine (COMPAZINE) 10 MG tablet Reported on 06/21/2015  0   No current facility-administered medications for this visit.    SURGICAL HISTORY:  Past Surgical History  Procedure Laterality Date  . Video bronchoscopy Bilateral 01/22/2015    Procedure: VIDEO BRONCHOSCOPY WITH FLUORO;  Surgeon: Marshell Garfinkel, MD;  Location: [facility];  Service: Cardiopulmonary;  Laterality: Bilateral;  . Supraclavical node biopsy Right 03/15/2015    Procedure: RIGHT SUPRACLAVICAL/CERVICAL LYMPH NODE BIOPSY;  Surgeon: [name], MD;  Location: [facility];  Service: Thoracic;  Laterality: Right;  . Scalene node biopsy Right 03/15/2015    Procedure: BIOPSY SCALENE NODE;  Surgeon: [name], MD;  Location: [facility];  Service: Thoracic;  Laterality: Right;  . Cardiac catheterization N/A 04/01/2015    Procedure: Pericardiocentesis;  Surgeon: Sherren Mocha, MD;  Location: [facility];  Service: Cardiovascular;  Laterality: N/A;  . Subxyphoid pericardial window  04/29/2015  . Subxyphoid pericardial window N/A 04/29/2015    Procedure: SUBXYPHOID PERICARDIAL WINDOW;  Surgeon: [name], MD;  Location: [facility];  Service: Thoracic;  Laterality: N/A;  . Tee without cardioversion N/A 04/29/2015    Procedure: TRANSESOPHAGEAL ECHOCARDIOGRAM (TEE);  Surgeon: [name], MD;  Location: [facility];  Service: Thoracic;  Laterality: N/A;  REVIEW OF SYSTEMS:  A comprehensive review of systems was negative except for: Constitutional: positive for fatigue   PHYSICAL EXAMINATION: General appearance: alert, cooperative, fatigued and no distress Head:  Normocephalic, without obvious abnormality, atraumatic Neck: marked anterior cervical adenopathy and thyroid not enlarged, symmetric, no tenderness/mass/nodules Lymph nodes: Large right cervical and supraclavicular as well as a right axillary lymphadenopathy Resp: wheezes RUL Back: symmetric, no curvature. ROM normal. No CVA tenderness. Cardio: regular rate and rhythm, S1, S2 normal, no murmur, click, rub or gallop GI: soft, non-tender; bowel sounds normal; no masses,  no organomegaly Extremities: extremities normal, atraumatic, no cyanosis or edema Neurologic: Alert and oriented X 3, normal strength and tone. Normal symmetric reflexes. Normal coordination and gait   ECOG PERFORMANCE STATUS: 1 - Symptomatic but completely ambulatory  Blood pressure 132/83, pulse 85, temperature 98.3 F (36.8 C), temperature source Oral, resp. rate 18, height 5\' 10"  (1.778 m), weight 175 lb 4.8 oz (79.516 kg), SpO2 100 %.  LABORATORY DATA: Lab Results  Component Value Date   WBC 3.9* 06/21/2015   HGB 13.7 06/21/2015   HCT 42.5 06/21/2015   MCV 82.2 06/21/2015   PLT 187 06/21/2015      Chemistry      Component Value Date/Time   NA 142 06/04/2015 1119   NA 140 05/01/2015 0300   NA 138 12/18/2013 0848   K 3.5 06/04/2015 1119   K 4.4 05/01/2015 0300   K 4.0 12/18/2013 0848   CL 104 05/01/2015 0300   CL 105 12/18/2013 0848   CO2 26 06/04/2015 1119   CO2 27 05/01/2015 0300   CO2 28 12/18/2013 0848   BUN 7.4 06/04/2015 1119   BUN <5* 05/01/2015 0300   BUN 7 12/18/2013 0848   CREATININE 0.8 06/04/2015 1119   CREATININE 0.58* 05/01/2015 0300   CREATININE 0.93 12/18/2013 0848      Component Value Date/Time   CALCIUM 9.2 06/04/2015 1119   CALCIUM 9.1 05/01/2015 0300   CALCIUM 9.0 12/18/2013 0848   ALKPHOS 114 06/04/2015 1119   ALKPHOS 151* 05/01/2015 0300   AST 12 06/04/2015 1119   AST 11* 05/01/2015 0300   ALT 9 06/04/2015 1119   ALT 21 05/01/2015 0300   BILITOT 0.37 06/04/2015 1119    BILITOT 0.2* 05/01/2015 0300       RADIOGRAPHIC STUDIES: Ir Cv Line Injection  06/04/2015  CLINICAL DATA:  Hodgkin's lymphoma. Current treatment via a a right-sided Port-A-Cath placed on 04/16/2015. Complaint of burning pain with flushing of port. EXAM: CONTRAST INJECTION OF PORT A CATH UNDER FLUOROSCOPY CONTRAST:  10 mL Isovue 300 FLUOROSCOPY TIME:  6 seconds. PROCEDURE: Contrast was administered via the indwelling port after it was accessed. Fluoroscopic spot images were obtained of the catheter during injection. FINDINGS: Right-sided jugular Port-A-Cath shows stable positioning after placement with the catheter tip at the cavoatrial junction. After access of the port, contrast injection shows normal patency of the reservoir and attached catheter with no evidence of contrast extravasation or obstruction. Contrast flows freely from the tip of the catheter. No evidence of significant fibrin sheath formation. IMPRESSION: Normal Port-A-Cath injection demonstrating a widely patent catheter and port reservoir without extravasation or abnormal catheter positioning. No evidence of significant fibrin sheath formation. Electronically Signed   By: Aletta Edouard M.D.   On: 06/04/2015 14:41    ASSESSMENT AND PLAN: This is a very pleasant 20 years old African-American male recently diagnosed with probably stage III nodular sclerosing Hodgkin lymphoma with bulky lymphadenopathy in the right  lower cervical, right supraclavicular, right axillary as well as mediastinal lymph nodes in addition to cavitary lung masses and splenic involvement diagnosed in March 2017. He is currently on systemic chemotherapy with ABVD status post 2 cycles.  He tolerated the treatment well. I recommended for him to proceed with day 1 of cycle #3 today as a scheduled. Because of the chemotherapy-induced neutropenia, I will arrange for the patient to receive Neulasta injection after his treatment today. He would come back for follow-up  visit in 2 weeks for reevaluation and management of any adverse effect of his treatment before starting day 15 of cycle #3. He was advised to call immediately if he has any concerning symptoms in the interval. The patient voices understanding of current disease status and treatment options and is in agreement with the current care plan.  All questions were answered. The patient knows to call the clinic with any problems, questions or concerns. We can certainly see the patient much sooner if necessary.  Disclaimer: This note was dictated with voice recognition software. Similar sounding words can inadvertently be transcribed and may not be corrected upon review.

## 2015-06-21 NOTE — Patient Instructions (Signed)
[facility] Discharge Instructions for Patients Receiving Chemotherapy  Today you received the following chemotherapy agents: adriamycin, vinblastine, bleocin, DTIC.  To help prevent nausea and vomiting after your treatment, we encourage you to take your nausea medication as directed. If you develop nausea and vomiting that is not controlled by your nausea medication, call the clinic.   BELOW ARE SYMPTOMS THAT SHOULD BE REPORTED IMMEDIATELY:  *FEVER GREATER THAN 100.5 F  *CHILLS WITH OR WITHOUT FEVER  NAUSEA AND VOMITING THAT IS NOT CONTROLLED WITH YOUR NAUSEA MEDICATION  *UNUSUAL SHORTNESS OF BREATH  *UNUSUAL BRUISING OR BLEEDING  TENDERNESS IN MOUTH AND THROAT WITH OR WITHOUT PRESENCE OF ULCERS  *URINARY PROBLEMS  *BOWEL PROBLEMS  UNUSUAL RASH Items with * indicate a potential emergency and should be followed up as soon as possible.  Feel free to call the clinic you have any questions or concerns. The clinic phone number is [phone].  Please show the [facility] at check-in to the Emergency Department and triage nurse.

## 2015-06-28 ENCOUNTER — Ambulatory Visit: Payer: Medicaid Other

## 2015-06-28 ENCOUNTER — Ambulatory Visit: Payer: Medicaid Other | Admitting: Internal Medicine

## 2015-06-28 ENCOUNTER — Other Ambulatory Visit: Payer: Medicaid Other

## 2015-07-01 ENCOUNTER — Other Ambulatory Visit: Payer: Self-pay | Admitting: Medical Oncology

## 2015-07-01 DIAGNOSIS — C8112 Nodular sclerosis classical Hodgkin lymphoma, intrathoracic lymph nodes: Secondary | ICD-10-CM

## 2015-07-05 ENCOUNTER — Ambulatory Visit (HOSPITAL_BASED_OUTPATIENT_CLINIC_OR_DEPARTMENT_OTHER): Payer: Self-pay

## 2015-07-05 ENCOUNTER — Encounter: Payer: Self-pay | Admitting: Internal Medicine

## 2015-07-05 ENCOUNTER — Telehealth: Payer: Self-pay | Admitting: Internal Medicine

## 2015-07-05 ENCOUNTER — Other Ambulatory Visit (HOSPITAL_BASED_OUTPATIENT_CLINIC_OR_DEPARTMENT_OTHER): Payer: Self-pay

## 2015-07-05 ENCOUNTER — Ambulatory Visit (HOSPITAL_BASED_OUTPATIENT_CLINIC_OR_DEPARTMENT_OTHER): Payer: Self-pay | Admitting: Internal Medicine

## 2015-07-05 VITALS — BP 141/77 | HR 86 | Temp 98.1°F | Resp 18 | Ht 70.0 in | Wt 177.7 lb

## 2015-07-05 DIAGNOSIS — Z5111 Encounter for antineoplastic chemotherapy: Secondary | ICD-10-CM

## 2015-07-05 DIAGNOSIS — C8112 Nodular sclerosis classical Hodgkin lymphoma, intrathoracic lymph nodes: Secondary | ICD-10-CM

## 2015-07-05 DIAGNOSIS — D701 Agranulocytosis secondary to cancer chemotherapy: Secondary | ICD-10-CM

## 2015-07-05 DIAGNOSIS — T451X5A Adverse effect of antineoplastic and immunosuppressive drugs, initial encounter: Secondary | ICD-10-CM

## 2015-07-05 LAB — COMPREHENSIVE METABOLIC PANEL
ALT: 10 U/L (ref 0–55)
AST: 14 U/L (ref 5–34)
Albumin: 3.7 g/dL (ref 3.5–5.0)
Alkaline Phosphatase: 93 U/L (ref 40–150)
Anion Gap: 10 mEq/L (ref 3–11)
BUN: 7.5 mg/dL (ref 7.0–26.0)
CALCIUM: 9.4 mg/dL (ref 8.4–10.4)
CHLORIDE: 107 meq/L (ref 98–109)
CO2: 24 mEq/L (ref 22–29)
Creatinine: 0.8 mg/dL (ref 0.7–1.3)
GLUCOSE: 115 mg/dL (ref 70–140)
POTASSIUM: 3.7 meq/L (ref 3.5–5.1)
SODIUM: 141 meq/L (ref 136–145)
Total Bilirubin: 0.32 mg/dL (ref 0.20–1.20)
Total Protein: 7 g/dL (ref 6.4–8.3)

## 2015-07-05 LAB — CBC WITH DIFFERENTIAL/PLATELET
BASO%: 2.8 % — ABNORMAL HIGH (ref 0.0–2.0)
BASOS ABS: 0.1 10*3/uL (ref 0.0–0.1)
EOS%: 2.7 % (ref 0.0–7.0)
Eosinophils Absolute: 0.1 10*3/uL (ref 0.0–0.5)
HCT: 43.3 % (ref 38.4–49.9)
HGB: 14 g/dL (ref 13.0–17.1)
LYMPH%: 42.2 % (ref 14.0–49.0)
MCH: 26.6 pg — AB (ref 27.2–33.4)
MCHC: 32.3 g/dL (ref 32.0–36.0)
MCV: 82.5 fL (ref 79.3–98.0)
MONO#: 0.3 10*3/uL (ref 0.1–0.9)
MONO%: 10.1 % (ref 0.0–14.0)
NEUT#: 1.2 10*3/uL — ABNORMAL LOW (ref 1.5–6.5)
NEUT%: 42.2 % (ref 39.0–75.0)
Platelets: 244 10*3/uL (ref 140–400)
RBC: 5.25 10*6/uL (ref 4.20–5.82)
RDW: 23.5 % — ABNORMAL HIGH (ref 11.0–14.6)
WBC: 2.9 10*3/uL — ABNORMAL LOW (ref 4.0–10.3)
lymph#: 1.2 10*3/uL (ref 0.9–3.3)

## 2015-07-05 MED ORDER — PALONOSETRON HCL INJECTION 0.25 MG/5ML
INTRAVENOUS | Status: AC
  Filled 2015-07-05: qty 5

## 2015-07-05 MED ORDER — DOXORUBICIN HCL CHEMO IV INJECTION 2 MG/ML
25.0000 mg/m2 | Freq: Once | INTRAVENOUS | Status: AC
  Administered 2015-07-05: 48 mg via INTRAVENOUS
  Filled 2015-07-05: qty 24

## 2015-07-05 MED ORDER — HEPARIN SOD (PORK) LOCK FLUSH 100 UNIT/ML IV SOLN
500.0000 [IU] | Freq: Once | INTRAVENOUS | Status: AC | PRN
  Administered 2015-07-05: 500 [IU]
  Filled 2015-07-05: qty 5

## 2015-07-05 MED ORDER — SODIUM CHLORIDE 0.9% FLUSH
10.0000 mL | INTRAVENOUS | Status: DC | PRN
  Administered 2015-07-05: 10 mL
  Filled 2015-07-05: qty 10

## 2015-07-05 MED ORDER — FOSAPREPITANT DIMEGLUMINE INJECTION 150 MG
Freq: Once | INTRAVENOUS | Status: AC
  Administered 2015-07-05: 15:00:00 via INTRAVENOUS
  Filled 2015-07-05: qty 5

## 2015-07-05 MED ORDER — PALONOSETRON HCL INJECTION 0.25 MG/5ML
0.2500 mg | Freq: Once | INTRAVENOUS | Status: AC
  Administered 2015-07-05: 0.25 mg via INTRAVENOUS

## 2015-07-05 MED ORDER — SODIUM CHLORIDE 0.9 % IV SOLN
Freq: Once | INTRAVENOUS | Status: AC
  Administered 2015-07-05: 14:00:00 via INTRAVENOUS

## 2015-07-05 MED ORDER — SODIUM CHLORIDE 0.9 % IV SOLN
375.0000 mg/m2 | Freq: Once | INTRAVENOUS | Status: AC
  Administered 2015-07-05: 720 mg via INTRAVENOUS
  Filled 2015-07-05: qty 36

## 2015-07-05 MED ORDER — SODIUM CHLORIDE 0.9 % IV SOLN
10.0000 [IU]/m2 | Freq: Once | INTRAVENOUS | Status: AC
  Administered 2015-07-05: 19 [IU] via INTRAVENOUS
  Filled 2015-07-05: qty 6.33

## 2015-07-05 MED ORDER — SODIUM CHLORIDE 0.9 % IV SOLN
5.7000 mg/m2 | Freq: Once | INTRAVENOUS | Status: AC
  Administered 2015-07-05: 11 mg via INTRAVENOUS
  Filled 2015-07-05: qty 11

## 2015-07-05 NOTE — Telephone Encounter (Signed)
Gave pt cal & avs °

## 2015-07-05 NOTE — Progress Notes (Signed)
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 Cytology was negative for lymphoma. 2) status post subxiphoid pericardial window under the care of Dr. Roxan Hockey on 04/29/2015  CURRENT THERAPY: Systemic chemotherapy with ABVD. First dose 04/20/2015. Status post 2 cycles.   INTERVAL HISTORY: Jeffery Crane 20 y.o. male returns to the clinic today for follow-up visit accompanied by his girlfriend. The patient has no complaints today. He tolerated the last dose of his treatment fairly well with no significant adverse effects except for one or 2 episodes of nausea. He denied having any significant fever or chills. He denied having any current nausea or vomiting. He denied having any significant chest pain, shortness of breath and has mild cough with no hemoptysis. He is here today to start day 15 of cycle #3.  MEDICAL HISTORY: Past Medical History  Diagnosis Date  . Adult ADHD   . Pneumonia   . Headache   . GERD (gastroesophageal reflux disease)   . 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 Anemia   . Encounter for antineoplastic chemotherapy 05/04/2015  . Chemotherapy induced neutropenia ([place]) 06/04/2015    ALLERGIES:  is allergic to amoxicillin-pot clavulanate and bactrim.  MEDICATIONS:  Current Outpatient Prescriptions  Medication Sig Dispense Refill  . allopurinol (ZYLOPRIM) 100 MG tablet Take 1 tablet (100 mg total) by mouth 2 (two) times daily. 60 tablet 3  . HYDROMET 5-1.5 MG/5ML  syrup Reported on 06/21/2015  0  . lidocaine-prilocaine (EMLA) cream Apply 1 application topically as needed. 30 g 0  . oxyCODONE (OXY IR/ROXICODONE) 5 MG immediate release tablet Take 1-2 tablets (5-10 mg total) by mouth every 4 (four) hours as needed for severe pain. 30 tablet 0  . prochlorperazine (COMPAZINE) 10 MG tablet Reported on 06/21/2015  0   No current facility-administered medications for this visit.    SURGICAL HISTORY:  Past Surgical History  Procedure Laterality Date  . Video bronchoscopy Bilateral 01/22/2015    Procedure: VIDEO BRONCHOSCOPY WITH FLUORO;  Surgeon: Marshell Garfinkel, MD;  Location: [facility];  Service: Cardiopulmonary;  Laterality: Bilateral;  . Supraclavical node biopsy Right 03/15/2015    Procedure: RIGHT SUPRACLAVICAL/CERVICAL LYMPH NODE BIOPSY;  Surgeon: [name], MD;  Location: [facility];  Service: Thoracic;  Laterality: Right;  . Scalene node biopsy Right 03/15/2015    Procedure: BIOPSY SCALENE NODE;  Surgeon: [name], MD;  Location: [facility];  Service: Thoracic;  Laterality: Right;  . Cardiac catheterization N/A 04/01/2015    Procedure: Pericardiocentesis;  Surgeon: Sherren Mocha, MD;  Location: [facility];  Service: Cardiovascular;  Laterality: N/A;  . Subxyphoid pericardial window  04/29/2015  . Subxyphoid pericardial window N/A 04/29/2015    Procedure: SUBXYPHOID PERICARDIAL WINDOW;  Surgeon: [name], MD;  Location: [facility];  Service: Thoracic;  Laterality: N/A;  . Tee without cardioversion N/A 04/29/2015    Procedure: TRANSESOPHAGEAL ECHOCARDIOGRAM (TEE);  Surgeon: [name], MD;  Location: [facility];  Service: Thoracic;  Laterality: N/A;    REVIEW OF SYSTEMS:  A comprehensive review of systems  was negative.   PHYSICAL EXAMINATION: General appearance: alert, cooperative, fatigued and no distress Head: Normocephalic, without obvious abnormality, atraumatic Neck: marked anterior cervical adenopathy and thyroid  not enlarged, symmetric, no tenderness/mass/nodules Lymph nodes: Large right cervical and supraclavicular as well as a right axillary lymphadenopathy Resp: wheezes RUL Back: symmetric, no curvature. ROM normal. No CVA tenderness. Cardio: regular rate and rhythm, S1, S2 normal, no murmur, click, rub or gallop GI: soft, non-tender; bowel sounds normal; no masses,  no organomegaly Extremities: extremities normal, atraumatic, no cyanosis or edema Neurologic: Alert and oriented X 3, normal strength and tone. Normal symmetric reflexes. Normal coordination and gait   ECOG PERFORMANCE STATUS: 1 - Symptomatic but completely ambulatory  Blood pressure 141/77, pulse 86, temperature 98.1 F (36.7 C), temperature source Oral, resp. rate 18, height 5\' 10"  (1.778 m), weight 177 lb 11.2 oz (80.604 kg), SpO2 100 %.  LABORATORY DATA: Lab Results  Component Value Date   WBC 2.9* 07/05/2015   HGB 14.0 07/05/2015   HCT 43.3 07/05/2015   MCV 82.5 07/05/2015   PLT 244 07/05/2015      Chemistry      Component Value Date/Time   NA 141 06/21/2015 1134   NA 140 05/01/2015 0300   NA 138 12/18/2013 0848   K 3.9 06/21/2015 1134   K 4.4 05/01/2015 0300   K 4.0 12/18/2013 0848   CL 104 05/01/2015 0300   CL 105 12/18/2013 0848   CO2 28 06/21/2015 1134   CO2 27 05/01/2015 0300   CO2 28 12/18/2013 0848   BUN 11.1 06/21/2015 1134   BUN <5* 05/01/2015 0300   BUN 7 12/18/2013 0848   CREATININE 0.9 06/21/2015 1134   CREATININE 0.58* 05/01/2015 0300   CREATININE 0.93 12/18/2013 0848      Component Value Date/Time   CALCIUM 9.2 06/21/2015 1134   CALCIUM 9.1 05/01/2015 0300   CALCIUM 9.0 12/18/2013 0848   ALKPHOS 98 06/21/2015 1134   ALKPHOS 151* 05/01/2015 0300   AST 14 06/21/2015 1134   AST 11* 05/01/2015 0300   ALT 16 06/21/2015 1134   ALT 21 05/01/2015 0300   BILITOT 0.30 06/21/2015 1134   BILITOT 0.2* 05/01/2015 0300       RADIOGRAPHIC STUDIES: No results found.  ASSESSMENT AND PLAN: This  is a very pleasant 20 years old African-American male recently diagnosed with probably stage III nodular sclerosing Hodgkin lymphoma with bulky lymphadenopathy in the right lower cervical, right supraclavicular, right axillary as well as mediastinal lymph nodes in addition to cavitary lung masses and splenic involvement diagnosed in March 2017. He is currently on systemic chemotherapy with ABVD status post 2 cycles.  He tolerated the treatment well. I recommended for him to proceed with day 15 of cycle #3 today as a scheduled. He would come back for follow-up visit in 2 weeks for reevaluation and management of any adverse effect of his treatment before starting day cycle #3 with repeat PET scan for evaluation of his disease. He was advised to call immediately if he has any concerning symptoms in the interval. The patient voices understanding of current disease status and treatment options and is in agreement with the current care plan.  All questions were answered. The patient knows to call the clinic with any problems, questions or concerns. We can certainly see the patient much sooner if necessary.  Disclaimer: This note was dictated with voice recognition software. Similar sounding words can inadvertently be transcribed and may not be corrected upon review.

## 2015-07-05 NOTE — Patient Instructions (Signed)
[facility] Discharge Instructions for Patients Receiving Chemotherapy  Today you received the following chemotherapy agents Adriamycin, Vinblastine, Bleomycin, Dacarbazine  To help prevent nausea and vomiting after your treatment, we encourage you to take your nausea medication   If you develop nausea and vomiting that is not controlled by your nausea medication, call the clinic.   BELOW ARE SYMPTOMS THAT SHOULD BE REPORTED IMMEDIATELY:  *FEVER GREATER THAN 100.5 F  *CHILLS WITH OR WITHOUT FEVER  NAUSEA AND VOMITING THAT IS NOT CONTROLLED WITH YOUR NAUSEA MEDICATION  *UNUSUAL SHORTNESS OF BREATH  *UNUSUAL BRUISING OR BLEEDING  TENDERNESS IN MOUTH AND THROAT WITH OR WITHOUT PRESENCE OF ULCERS  *URINARY PROBLEMS  *BOWEL PROBLEMS  UNUSUAL RASH Items with * indicate a potential emergency and should be followed up as soon as possible.  Feel free to call the clinic you have any questions or concerns. The clinic phone number is [phone].  Please show the [place] at check-in to the Emergency Department and triage nurse.

## 2015-07-05 NOTE — Progress Notes (Signed)
Okay to treat despite Neut = 1.2, per Dr. Julien Nordmann

## 2015-07-07 ENCOUNTER — Ambulatory Visit: Payer: Medicaid Other

## 2015-07-07 ENCOUNTER — Telehealth: Payer: Self-pay | Admitting: *Deleted

## 2015-07-07 NOTE — Telephone Encounter (Signed)
Fax from on call triage after hours, pt had c/o back pain and sore throat. Returned call to pt at cell and home #. Unable to reach on cell, unable to leave message. Home# [id number] for pt advised his symptoms are most likely related to the neulasta injection.  Reminded pt he cannot have children while taking treatment as the development of fetus will be affected. Pt;'s girlfriend came back to MD's office after pt last visit and asked if they could have children. Requested pt call office to confirm message was received.

## 2015-07-12 ENCOUNTER — Ambulatory Visit: Payer: Medicaid Other | Admitting: Internal Medicine

## 2015-07-12 ENCOUNTER — Other Ambulatory Visit: Payer: Medicaid Other

## 2015-07-12 ENCOUNTER — Ambulatory Visit: Payer: Medicaid Other

## 2015-07-16 ENCOUNTER — Other Ambulatory Visit: Payer: Self-pay | Admitting: Internal Medicine

## 2015-07-16 DIAGNOSIS — C8112 Nodular sclerosis classical Hodgkin lymphoma, intrathoracic lymph nodes: Secondary | ICD-10-CM

## 2015-07-19 ENCOUNTER — Other Ambulatory Visit: Payer: Medicaid Other

## 2015-07-19 ENCOUNTER — Ambulatory Visit: Payer: Medicaid Other | Admitting: Internal Medicine

## 2015-07-19 ENCOUNTER — Ambulatory Visit: Payer: Medicaid Other

## 2015-07-19 ENCOUNTER — Ambulatory Visit (HOSPITAL_COMMUNITY): Payer: MEDICAID

## 2015-07-20 ENCOUNTER — Ambulatory Visit: Payer: Medicaid Other

## 2015-07-20 ENCOUNTER — Other Ambulatory Visit: Payer: Medicaid Other

## 2015-07-20 ENCOUNTER — Other Ambulatory Visit: Payer: Self-pay | Admitting: *Deleted

## 2015-07-20 ENCOUNTER — Ambulatory Visit: Payer: Medicaid Other | Admitting: Internal Medicine

## 2015-07-20 ENCOUNTER — Encounter (HOSPITAL_COMMUNITY)
Admission: RE | Admit: 2015-07-20 | Discharge: 2015-07-20 | Disposition: A | Discharge status: Home / Self Care | Payer: MEDICAID | Source: Ambulatory Visit | Attending: Internal Medicine | Admitting: Internal Medicine

## 2015-07-20 DIAGNOSIS — C8112 Nodular sclerosis classical Hodgkin lymphoma, intrathoracic lymph nodes: Secondary | ICD-10-CM | POA: Insufficient documentation

## 2015-07-20 LAB — GLUCOSE, CAPILLARY: GLUCOSE-CAPILLARY: 92 mg/dL (ref 65–99)

## 2015-07-20 MED ORDER — FLUDEOXYGLUCOSE F - 18 (FDG) INJECTION
8.7900 | Freq: Once | INTRAVENOUS | Status: AC | PRN
  Administered 2015-07-20: 8.79 via INTRAVENOUS

## 2015-07-23 ENCOUNTER — Telehealth: Payer: Self-pay | Admitting: *Deleted

## 2015-07-23 NOTE — Telephone Encounter (Signed)
Pt called states " I couldn't make it to my appt after working 3rd shift and no one could take me. I need to r/s the lab/MD/Chemo visit." Discussed with pt MD out of office pt will forward pt concern to MD for review.

## 2015-07-27 ENCOUNTER — Other Ambulatory Visit: Payer: Medicaid Other

## 2015-07-27 ENCOUNTER — Encounter (HOSPITAL_COMMUNITY): Payer: Self-pay

## 2015-07-27 ENCOUNTER — Ambulatory Visit: Payer: Medicaid Other | Admitting: Internal Medicine

## 2015-07-27 ENCOUNTER — Ambulatory Visit: Payer: Medicaid Other

## 2015-07-28 ENCOUNTER — Telehealth: Payer: Self-pay

## 2015-07-28 NOTE — Telephone Encounter (Signed)
Per Dr.Smith pt needs a follow up appt with him first available. lmom for pt to call back to schedule.

## 2015-08-03 ENCOUNTER — Ambulatory Visit (HOSPITAL_BASED_OUTPATIENT_CLINIC_OR_DEPARTMENT_OTHER): Payer: Self-pay | Admitting: Internal Medicine

## 2015-08-03 ENCOUNTER — Other Ambulatory Visit (HOSPITAL_BASED_OUTPATIENT_CLINIC_OR_DEPARTMENT_OTHER): Payer: Self-pay

## 2015-08-03 ENCOUNTER — Other Ambulatory Visit: Payer: Self-pay | Admitting: Medical Oncology

## 2015-08-03 ENCOUNTER — Telehealth: Payer: Self-pay | Admitting: Internal Medicine

## 2015-08-03 ENCOUNTER — Encounter: Payer: Self-pay | Admitting: Internal Medicine

## 2015-08-03 ENCOUNTER — Ambulatory Visit (HOSPITAL_BASED_OUTPATIENT_CLINIC_OR_DEPARTMENT_OTHER): Payer: Self-pay

## 2015-08-03 VITALS — BP 131/92 | HR 92 | Temp 97.6°F | Resp 18 | Wt 186.1 lb

## 2015-08-03 DIAGNOSIS — T451X5A Adverse effect of antineoplastic and immunosuppressive drugs, initial encounter: Secondary | ICD-10-CM

## 2015-08-03 DIAGNOSIS — C8112 Nodular sclerosis classical Hodgkin lymphoma, intrathoracic lymph nodes: Secondary | ICD-10-CM

## 2015-08-03 DIAGNOSIS — Z5111 Encounter for antineoplastic chemotherapy: Secondary | ICD-10-CM

## 2015-08-03 DIAGNOSIS — D701 Agranulocytosis secondary to cancer chemotherapy: Secondary | ICD-10-CM

## 2015-08-03 LAB — CBC WITH DIFFERENTIAL/PLATELET
BASO%: 1.4 % (ref 0.0–2.0)
Basophils Absolute: 0 10*3/uL (ref 0.0–0.1)
EOS%: 1.3 % (ref 0.0–7.0)
Eosinophils Absolute: 0 10*3/uL (ref 0.0–0.5)
HCT: 45.4 % (ref 38.4–49.9)
HGB: 15 g/dL (ref 13.0–17.1)
LYMPH%: 25.2 % (ref 14.0–49.0)
MCH: 27.6 pg (ref 27.2–33.4)
MCHC: 33.1 g/dL (ref 32.0–36.0)
MCV: 83.3 fL (ref 79.3–98.0)
MONO#: 0.5 10*3/uL (ref 0.1–0.9)
MONO%: 17.1 % — AB (ref 0.0–14.0)
NEUT%: 55 % (ref 39.0–75.0)
NEUTROS ABS: 1.6 10*3/uL (ref 1.5–6.5)
Platelets: 223 10*3/uL (ref 140–400)
RBC: 5.45 10*6/uL (ref 4.20–5.82)
RDW: 20.6 % — ABNORMAL HIGH (ref 11.0–14.6)
WBC: 2.9 10*3/uL — AB (ref 4.0–10.3)
lymph#: 0.7 10*3/uL — ABNORMAL LOW (ref 0.9–3.3)

## 2015-08-03 LAB — COMPREHENSIVE METABOLIC PANEL
ALT: 17 U/L (ref 0–55)
AST: 19 U/L (ref 5–34)
Albumin: 3.8 g/dL (ref 3.5–5.0)
Alkaline Phosphatase: 94 U/L (ref 40–150)
Anion Gap: 7 mEq/L (ref 3–11)
BILIRUBIN TOTAL: 0.43 mg/dL (ref 0.20–1.20)
BUN: 7.6 mg/dL (ref 7.0–26.0)
CHLORIDE: 105 meq/L (ref 98–109)
CO2: 28 meq/L (ref 22–29)
CREATININE: 0.9 mg/dL (ref 0.7–1.3)
Calcium: 9.6 mg/dL (ref 8.4–10.4)
EGFR: >90 mL/min/{1.73_m2} (ref >90)
GLUCOSE: 96 mg/dL (ref 70–140)
Potassium: 3.8 mEq/L (ref 3.5–5.1)
Sodium: 140 mEq/L (ref 136–145)
TOTAL PROTEIN: 7.3 g/dL (ref 6.4–8.3)

## 2015-08-03 MED ORDER — ONDANSETRON HCL 8 MG PO TABS: 8.0000 mg | ORAL_TABLET | Freq: Three times a day (TID) | ORAL | 0 refills | Status: DC | PRN

## 2015-08-03 MED ORDER — PALONOSETRON HCL INJECTION 0.25 MG/5ML
0.2500 mg | Freq: Once | INTRAVENOUS | Status: AC
  Administered 2015-08-03: 0.25 mg via INTRAVENOUS

## 2015-08-03 MED ORDER — SODIUM CHLORIDE 0.9 % IV SOLN
10.0000 [IU]/m2 | Freq: Once | INTRAVENOUS | Status: AC
Start: 2015-08-03 — End: 2015-08-03
  Administered 2015-08-03: 19 [IU] via INTRAVENOUS
  Filled 2015-08-03: qty 6.33

## 2015-08-03 MED ORDER — VINBLASTINE SULFATE CHEMO INJECTION 1 MG/ML
5.7000 mg/m2 | Freq: Once | INTRAVENOUS | Status: AC
  Administered 2015-08-03: 11 mg via INTRAVENOUS
  Filled 2015-08-03: qty 11

## 2015-08-03 MED ORDER — HEPARIN SOD (PORK) LOCK FLUSH 100 UNIT/ML IV SOLN
500.0000 [IU] | Freq: Once | INTRAVENOUS | Status: AC | PRN
  Administered 2015-08-03: 500 [IU]
  Filled 2015-08-03: qty 5

## 2015-08-03 MED ORDER — SODIUM CHLORIDE 0.9 % IV SOLN
Freq: Once | INTRAVENOUS | Status: AC
  Administered 2015-08-03: 15:00:00 via INTRAVENOUS
  Filled 2015-08-03: qty 5

## 2015-08-03 MED ORDER — PALONOSETRON HCL INJECTION 0.25 MG/5ML
INTRAVENOUS | Status: AC
Start: 2015-08-03 — End: 2015-08-03
  Filled 2015-08-03: qty 5

## 2015-08-03 MED ORDER — SODIUM CHLORIDE 0.9 % IV SOLN
Freq: Once | INTRAVENOUS | Status: AC
  Administered 2015-08-03: 15:00:00 via INTRAVENOUS

## 2015-08-03 MED ORDER — SODIUM CHLORIDE 0.9% FLUSH
10.0000 mL | INTRAVENOUS | Status: DC | PRN
  Administered 2015-08-03: 10 mL
  Filled 2015-08-03: qty 10

## 2015-08-03 MED ORDER — DOXORUBICIN HCL CHEMO IV INJECTION 2 MG/ML
25.0000 mg/m2 | Freq: Once | INTRAVENOUS | Status: AC
  Administered 2015-08-03: 48 mg via INTRAVENOUS
  Filled 2015-08-03: qty 24

## 2015-08-03 MED ORDER — SODIUM CHLORIDE 0.9 % IV SOLN
375.0000 mg/m2 | Freq: Once | INTRAVENOUS | Status: AC
  Administered 2015-08-03: 720 mg via INTRAVENOUS
  Filled 2015-08-03: qty 36

## 2015-08-03 NOTE — Telephone Encounter (Signed)
Gave pt cal & avs °

## 2015-08-03 NOTE — Progress Notes (Signed)
Called in zofran to local pharmacy

## 2015-08-03 NOTE — Progress Notes (Signed)
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 Cytology was negative for lymphoma. 2) status post subxiphoid pericardial window under the care of Dr. Roxan Hockey on 04/29/2015  CURRENT THERAPY: Systemic chemotherapy with ABVD. First dose 04/20/2015. Status post 3 cycles.   INTERVAL HISTORY: Jeffery Crane 20 y.o. male returns to the clinic today for follow-up visit accompanied by his girlfriend. The patient has no complaints today. He tolerated the last dose of his treatment fairly well with no significant adverse effects. He denied having any significant fever or chills. He denied having any current nausea or vomiting. He denied having any significant chest pain, shortness of breath and has mild cough with no hemoptysis. He had repeat PET scan performed recently and he is here for evaluation and discussion of his scan results.  MEDICAL HISTORY: Past Medical History:  Diagnosis Date  . Adult ADHD   . Anemia   . Chemotherapy induced neutropenia ([facility]) 06/04/2015  . Encounter for antineoplastic chemotherapy 05/04/2015  . GERD (gastroesophageal reflux disease)   . Headache   . 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 Pneumonia     ALLERGIES:  is allergic to amoxicillin-pot clavulanate and bactrim.  MEDICATIONS:  Current Outpatient Prescriptions  Medication Sig Dispense Refill  . allopurinol (ZYLOPRIM) 100 MG tablet Take 1 tablet (100 mg total) by mouth 2 (two) times daily. 60 tablet 3    . HYDROMET 5-1.5 MG/5ML syrup Reported on 06/21/2015  0  . lidocaine-prilocaine (EMLA) cream Apply 1 application topically as needed. 30 g 0  . oxyCODONE (OXY IR/ROXICODONE) 5 MG immediate release tablet Take 1-2 tablets (5-10 mg total) by mouth every 4 (four) hours as needed for severe pain. 30 tablet 0  . prochlorperazine (COMPAZINE) 10 MG tablet Reported on 06/21/2015  0   No current facility-administered medications for this visit.     SURGICAL HISTORY:  Past Surgical History:  Procedure Laterality Date  . CARDIAC CATHETERIZATION N/A 04/01/2015   Procedure: Pericardiocentesis;  Surgeon: Sherren Mocha, MD;  Location: [facility];  Service: Cardiovascular;  Laterality: N/A;  . SCALENE NODE BIOPSY Right 03/15/2015   Procedure: BIOPSY SCALENE NODE;  Surgeon: [name], MD;  Location: [facility];  Service: Thoracic;  Laterality: Right;  . SUBXYPHOID PERICARDIAL WINDOW  04/29/2015  . SUBXYPHOID PERICARDIAL WINDOW N/A 04/29/2015   Procedure: SUBXYPHOID PERICARDIAL WINDOW;  Surgeon: [name], MD;  Location: [facility];  Service: Thoracic;  Laterality: N/A;  . SUPRACLAVICAL NODE BIOPSY Right 03/15/2015   Procedure: RIGHT SUPRACLAVICAL/CERVICAL LYMPH NODE BIOPSY;  Surgeon: [name], MD;  Location: [facility];  Service: Thoracic;  Laterality: Right;  . TEE WITHOUT CARDIOVERSION N/A 04/29/2015   Procedure: TRANSESOPHAGEAL ECHOCARDIOGRAM (TEE);  Surgeon: [name], MD;  Location: [facility];  Service: Thoracic;  Laterality: N/A;  . VIDEO BRONCHOSCOPY Bilateral 01/22/2015   Procedure: VIDEO BRONCHOSCOPY WITH FLUORO;  Surgeon: Marshell Garfinkel, MD;  Location: [facility];  Service: Cardiopulmonary;  Laterality: Bilateral;    REVIEW OF SYSTEMS:  Constitutional: negative Eyes: negative Ears, nose, mouth, throat, and  face: positive for sore throat Respiratory: negative Cardiovascular: negative Gastrointestinal: negative Genitourinary:negative Integument/breast:  negative Hematologic/lymphatic: negative Musculoskeletal:negative Neurological: negative Behavioral/Psych: negative Endocrine: negative Allergic/Immunologic: negative   PHYSICAL EXAMINATION: General appearance: alert, cooperative, fatigued and no distress Head: Normocephalic, without obvious abnormality, atraumatic Neck: marked anterior cervical adenopathy and thyroid not enlarged, symmetric, no tenderness/mass/nodules Lymph nodes: Large right cervical and supraclavicular as well as a right axillary lymphadenopathy Resp: wheezes RUL Back: symmetric, no curvature. ROM normal. No CVA tenderness. Cardio: regular rate and rhythm, S1, S2 normal, no murmur, click, rub or gallop GI: soft, non-tender; bowel sounds normal; no masses,  no organomegaly Extremities: extremities normal, atraumatic, no cyanosis or edema Neurologic: Alert and oriented X 3, normal strength and tone. Normal symmetric reflexes. Normal coordination and gait   ECOG PERFORMANCE STATUS: 1 - Symptomatic but completely ambulatory  Blood pressure (!) 131/92, pulse 92, temperature 97.6 F (36.4 C), temperature source Oral, resp. rate 18, weight 186 lb 1.6 oz (84.4 kg), SpO2 100 %.  LABORATORY DATA: Lab Results  Component Value Date   WBC 2.9 (L) 08/03/2015   HGB 15.0 08/03/2015   HCT 45.4 08/03/2015   MCV 83.3 08/03/2015   PLT 223 08/03/2015      Chemistry      Component Value Date/Time   NA 141 07/05/2015 1319   K 3.7 07/05/2015 1319   CL 104 05/01/2015 0300   CL 105 12/18/2013 0848   CO2 24 07/05/2015 1319   BUN 7.5 07/05/2015 1319   CREATININE 0.8 07/05/2015 1319      Component Value Date/Time   CALCIUM 9.4 07/05/2015 1319   ALKPHOS 93 07/05/2015 1319   AST 14 07/05/2015 1319   ALT 10 07/05/2015 1319   BILITOT 0.32 07/05/2015 1319       RADIOGRAPHIC STUDIES: Nm Pet Image Restag (ps) Skull Base To Thigh  Result Date: 07/20/2015 CLINICAL DATA:  Subsequent treatment strategy for nodular sclerosing  Hodgkin's lymphoma with ongoing chemotherapy. EXAM: NUCLEAR MEDICINE PET SKULL BASE TO THIGH TECHNIQUE: 8.8 mCi F-18 FDG was injected intravenously. Full-ring PET imaging was performed from the skull base to thigh after the radiotracer. CT data was obtained and used for attenuation correction and anatomic localization. FASTING BLOOD GLUCOSE:  Value: 92 mg/dl COMPARISON:  03/31/2015 PET-CT. FINDINGS: NECK There is relatively symmetric prominent brown fat hypermetabolism throughout the bilateral neck, most prominent in the suboccipital fat and lower posterior triangle fat. No residual enlarged or hypermetabolic lymph nodes in the neck. Stable small mucous retention cyst versus polyp in the anterior right maxillary sinus. CHEST Right anterior mediastinal prevascular 3.9 x 3.2 cm mass extending into the medial right upper lobe of the lung (series 4/image 69) demonstrates mild residual hypermetabolism with max SUV 4.2, significantly decreased in size and metabolism from 9.6 x 4.5 cm with previous max SUV 17.0. No additional enlarged or hypermetabolic mediastinal lymph nodes. No hypermetabolic axillary or hilar nodes. There is relatively symmetric brown fat hypermetabolism in the bilateral axilla and throughout the bilateral paraspinal fat in the chest. Right internal jugular MediPort terminates at the cavoatrial junction. No residual significant pericardial effusion. No pleural effusions. There a few scattered 2-3 mm pulmonary nodules in the right middle lobe, which are below PET resolution and not definitely changed. No acute consolidative airspace disease or new significant pulmonary nodules. ABDOMEN/PELVIS No abnormal hypermetabolic activity within the liver, pancreas, adrenal glands, or spleen. Max SUV in the spleen is 2.6, decreased (previous max SUV in the spleen 4.3). Spleen is now hypometabolic to  the liver. No hypermetabolic lymph nodes in the abdomen or pelvis. SKELETON No focal hypermetabolic activity to  suggest skeletal metastasis. No residual hypermetabolism in the skeletal marrow. IMPRESSION: 1. Near complete metabolic response. Mild residual hypermetabolism within the right anterior mediastinal prevascular/medial right upper lobe lung mass, which is significantly decreased in size. Otherwise resolved neck, axillary, mediastinal and hilar adenopathy. Resolved splenic and marrow hypermetabolism. 2. Prominent brown fat hypermetabolism in the neck and chest. Electronically Signed   By: Ilona Sorrel M.D.   On: 07/20/2015 10:54    ASSESSMENT AND PLAN: This is a very pleasant 20 years old African-American male recently diagnosed with probably stage III nodular sclerosing Hodgkin lymphoma with bulky lymphadenopathy in the right lower cervical, right supraclavicular, right axillary as well as mediastinal lymph nodes in addition to cavitary lung masses and splenic involvement diagnosed in March 2017. He is currently on systemic chemotherapy with ABVD status post 3 cycles.  He tolerated the treatment well. The recent PET scan showed near complete metabolic response with some mild residual hypermetabolism in the right anterior mediastinal prevascular and medial right upper lobe lung mass. I discussed the scan results and showed the images to the patient and his girlfriend. I recommended for him to proceed with chemotherapy with day 1 of cycle #4 today as a scheduled. I strongly recommended for the patient to keep his appointment for the chemotherapy and not to skip treatment as he did in the past. I also strongly advised the patient and his girlfriend to use protective measures to avoid any pregnancy during his treatment. He would come back for follow-up visit in 2 weeks for reevaluation and management of any adverse effect of his treatment.  He was advised to call immediately if he has any concerning symptoms in the interval. The patient voices understanding of current disease status and treatment options and is  in agreement with the current care plan.  All questions were answered. The patient knows to call the clinic with any problems, questions or concerns. We can certainly see the patient much sooner if necessary.  Disclaimer: This note was dictated with voice recognition software. Similar sounding words can inadvertently be transcribed and may not be corrected upon review.

## 2015-08-04 ENCOUNTER — Encounter: Payer: Self-pay | Admitting: Medical Oncology

## 2015-08-10 ENCOUNTER — Ambulatory Visit: Payer: Medicaid Other

## 2015-08-10 ENCOUNTER — Other Ambulatory Visit: Payer: Medicaid Other

## 2015-08-10 ENCOUNTER — Ambulatory Visit: Payer: Medicaid Other | Admitting: Internal Medicine

## 2015-08-13 ENCOUNTER — Telehealth: Payer: Self-pay | Admitting: Internal Medicine

## 2015-08-13 NOTE — Telephone Encounter (Signed)
PM PAL - MOVED 8/15 LAB/FU TO 8/14 - TX WILL REMAIN 8/15. SPOKE WITH PATIENT HE IS AWARE

## 2015-08-16 ENCOUNTER — Ambulatory Visit (HOSPITAL_BASED_OUTPATIENT_CLINIC_OR_DEPARTMENT_OTHER): Payer: Self-pay | Admitting: Internal Medicine

## 2015-08-16 ENCOUNTER — Other Ambulatory Visit (HOSPITAL_BASED_OUTPATIENT_CLINIC_OR_DEPARTMENT_OTHER): Payer: Self-pay

## 2015-08-16 ENCOUNTER — Encounter: Payer: Self-pay | Admitting: Internal Medicine

## 2015-08-16 ENCOUNTER — Other Ambulatory Visit: Payer: Self-pay | Admitting: *Deleted

## 2015-08-16 VITALS — BP 130/86 | HR 84 | Temp 98.4°F | Resp 18 | Ht 70.0 in | Wt 185.1 lb

## 2015-08-16 DIAGNOSIS — Z5111 Encounter for antineoplastic chemotherapy: Secondary | ICD-10-CM

## 2015-08-16 DIAGNOSIS — D630 Anemia in neoplastic disease: Secondary | ICD-10-CM

## 2015-08-16 DIAGNOSIS — C8112 Nodular sclerosis classical Hodgkin lymphoma, intrathoracic lymph nodes: Secondary | ICD-10-CM

## 2015-08-16 LAB — COMPREHENSIVE METABOLIC PANEL
ALBUMIN: 3.5 g/dL (ref 3.5–5.0)
ALK PHOS: 79 U/L (ref 40–150)
ALT: 19 U/L (ref 0–55)
AST: 17 U/L (ref 5–34)
Anion Gap: 7 mEq/L (ref 3–11)
BUN: 8.6 mg/dL (ref 7.0–26.0)
CO2: 27 mEq/L (ref 22–29)
CREATININE: 0.8 mg/dL (ref 0.7–1.3)
Calcium: 9.3 mg/dL (ref 8.4–10.4)
Chloride: 107 mEq/L (ref 98–109)
EGFR: >90 mL/min/{1.73_m2} (ref >90)
GLUCOSE: 84 mg/dL (ref 70–140)
Potassium: 4.1 mEq/L (ref 3.5–5.1)
SODIUM: 141 meq/L (ref 136–145)
TOTAL PROTEIN: 6.9 g/dL (ref 6.4–8.3)

## 2015-08-16 LAB — CBC WITH DIFFERENTIAL/PLATELET
BASO%: 2.1 % — ABNORMAL HIGH (ref 0.0–2.0)
Basophils Absolute: 0.1 10*3/uL (ref 0.0–0.1)
EOS%: 2.1 % (ref 0.0–7.0)
Eosinophils Absolute: 0.1 10*3/uL (ref 0.0–0.5)
HCT: 39.6 % (ref 38.4–49.9)
HEMOGLOBIN: 13.7 g/dL (ref 13.0–17.1)
LYMPH#: 2.4 10*3/uL (ref 0.9–3.3)
LYMPH%: 55.4 % — ABNORMAL HIGH (ref 14.0–49.0)
MCH: 28.4 pg (ref 27.2–33.4)
MCHC: 34.6 g/dL (ref 32.0–36.0)
MCV: 82 fL (ref 79.3–98.0)
MONO#: 0.4 10*3/uL (ref 0.1–0.9)
MONO%: 9.6 % (ref 0.0–14.0)
NEUT%: 30.8 % — ABNORMAL LOW (ref 39.0–75.0)
NEUTROS ABS: 1.3 10*3/uL — AB (ref 1.5–6.5)
NRBC: 0 % (ref 0–0)
Platelets: 174 10*3/uL (ref 140–400)
RBC: 4.83 10*6/uL (ref 4.20–5.82)
RDW: 16.5 % — AB (ref 11.0–14.6)
WBC: 4.3 10*3/uL (ref 4.0–10.3)

## 2015-08-16 NOTE — Progress Notes (Signed)
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 Cytology was negative for lymphoma. 2) status post subxiphoid pericardial window under the care of Dr. Roxan Hockey on 04/29/2015  CURRENT THERAPY: Systemic chemotherapy with ABVD. First dose 04/20/2015. Status post 3 cycles and he is currently undergoing cycle #4.   INTERVAL HISTORY: Jeffery Crane 20 y.o. male returns to the clinic today for follow-up visit accompanied by his girlfriend. The patient has no complaints today. He tolerated the last dose of his treatment fairly well with no significant adverse effects except for a day of nausea, vomiting and diarrhea after eating [place] food the day after his chemotherapy but this resolved daily later. He denied having any significant fever or chills. He denied having any current nausea or vomiting. He denied having any significant chest pain, shortness of breath and has mild cough with no hemoptysis. He is here for evaluation before starting day 15 of cycle #4 tomorrow.  MEDICAL HISTORY: Past Medical History:  Diagnosis Date  . Adult ADHD   . Anemia   . Chemotherapy induced neutropenia ([facility]) 06/04/2015  . Encounter for antineoplastic chemotherapy 05/04/2015  . GERD (gastroesophageal reflux disease)   . Headache   . 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 Pneumonia     ALLERGIES:  is allergic to amoxicillin-pot clavulanate and bactrim.  MEDICATIONS:  Current Outpatient Prescriptions    Medication Sig Dispense Refill  . allopurinol (ZYLOPRIM) 100 MG tablet Take 1 tablet (100 mg total) by mouth 2 (two) times daily. 60 tablet 3  . HYDROMET 5-1.5 MG/5ML syrup Reported on 06/21/2015  0  . lidocaine-prilocaine (EMLA) cream Apply 1 application topically as needed. 30 g 0  . ondansetron (ZOFRAN) 8 MG tablet Take 1 tablet (8 mg total) by mouth every 8 (eight) hours as needed for nausea or vomiting (Start on day 4 after chemotherapy and take only as needed if compazine not effecctive). 20 tablet 0  . oxyCODONE (OXY IR/ROXICODONE) 5 MG immediate release tablet Take 1-2 tablets (5-10 mg total) by mouth every 4 (four) hours as needed for severe pain. 30 tablet 0  . prochlorperazine (COMPAZINE) 10 MG tablet Reported on 06/21/2015  0   No current facility-administered medications for this visit.     SURGICAL HISTORY:  Past Surgical History:  Procedure Laterality Date  . CARDIAC CATHETERIZATION N/A 04/01/2015   Procedure: Pericardiocentesis;  Surgeon: Sherren Mocha, MD;  Location: [facility];  Service: Cardiovascular;  Laterality: N/A;  . SCALENE NODE BIOPSY Right 03/15/2015   Procedure: BIOPSY SCALENE NODE;  Surgeon: [name], MD;  Location: [facility];  Service: Thoracic;  Laterality: Right;  . SUBXYPHOID PERICARDIAL WINDOW  04/29/2015  . SUBXYPHOID PERICARDIAL WINDOW N/A 04/29/2015   Procedure: SUBXYPHOID PERICARDIAL WINDOW;  Surgeon: [name], MD;  Location: [facility];  Service: Thoracic;  Laterality: N/A;  . SUPRACLAVICAL NODE BIOPSY Right 03/15/2015   Procedure: RIGHT SUPRACLAVICAL/CERVICAL LYMPH NODE BIOPSY;  Surgeon: [name], MD;  Location: [facility];  Service: Thoracic;  Laterality: Right;  . TEE WITHOUT CARDIOVERSION N/A  04/29/2015   Procedure: TRANSESOPHAGEAL ECHOCARDIOGRAM (TEE);  Surgeon: [name], MD;  Location: [facility];  Service: Thoracic;  Laterality: N/A;  . VIDEO BRONCHOSCOPY Bilateral 01/22/2015   Procedure: VIDEO BRONCHOSCOPY WITH  FLUORO;  Surgeon: Marshell Garfinkel, MD;  Location: [facility];  Service: Cardiopulmonary;  Laterality: Bilateral;    REVIEW OF SYSTEMS:  A comprehensive review of systems was negative except for: Constitutional: positive for fatigue   PHYSICAL EXAMINATION: General appearance: alert, cooperative, fatigued and no distress Head: Normocephalic, without obvious abnormality, atraumatic Neck: marked anterior cervical adenopathy and thyroid not enlarged, symmetric, no tenderness/mass/nodules Lymph nodes: Large right cervical and supraclavicular as well as a right axillary lymphadenopathy Resp: wheezes RUL Back: symmetric, no curvature. ROM normal. No CVA tenderness. Cardio: regular rate and rhythm, S1, S2 normal, no murmur, click, rub or gallop GI: soft, non-tender; bowel sounds normal; no masses,  no organomegaly Extremities: extremities normal, atraumatic, no cyanosis or edema Neurologic: Alert and oriented X 3, normal strength and tone. Normal symmetric reflexes. Normal coordination and gait   ECOG PERFORMANCE STATUS: 1 - Symptomatic but completely ambulatory  Blood pressure 130/86, pulse 84, temperature 98.4 F (36.9 C), temperature source Oral, resp. rate 18, height 5\' 10"  (1.778 m), weight 185 lb 1.6 oz (84 kg), SpO2 100 %.  LABORATORY DATA: Lab Results  Component Value Date   WBC 4.3 08/16/2015   HGB 13.7 08/16/2015   HCT 39.6 08/16/2015   MCV 82.0 08/16/2015   PLT 174 08/16/2015      Chemistry      Component Value Date/Time   NA 140 08/03/2015 1351   K 3.8 08/03/2015 1351   CL 104 05/01/2015 0300   CL 105 12/18/2013 0848   CO2 28 08/03/2015 1351   BUN 7.6 08/03/2015 1351   CREATININE 0.9 08/03/2015 1351      Component Value Date/Time   CALCIUM 9.6 08/03/2015 1351   ALKPHOS 94 08/03/2015 1351   AST 19 08/03/2015 1351   ALT 17 08/03/2015 1351   BILITOT 0.43 08/03/2015 1351       RADIOGRAPHIC STUDIES: Nm Pet Image Restag (ps) Skull Base To Thigh  Result Date:  07/20/2015 CLINICAL DATA:  Subsequent treatment strategy for nodular sclerosing Hodgkin's lymphoma with ongoing chemotherapy. EXAM: NUCLEAR MEDICINE PET SKULL BASE TO THIGH TECHNIQUE: 8.8 mCi F-18 FDG was injected intravenously. Full-ring PET imaging was performed from the skull base to thigh after the radiotracer. CT data was obtained and used for attenuation correction and anatomic localization. FASTING BLOOD GLUCOSE:  Value: 92 mg/dl COMPARISON:  03/31/2015 PET-CT. FINDINGS: NECK There is relatively symmetric prominent brown fat hypermetabolism throughout the bilateral neck, most prominent in the suboccipital fat and lower posterior triangle fat. No residual enlarged or hypermetabolic lymph nodes in the neck. Stable small mucous retention cyst versus polyp in the anterior right maxillary sinus. CHEST Right anterior mediastinal prevascular 3.9 x 3.2 cm mass extending into the medial right upper lobe of the lung (series 4/image 69) demonstrates mild residual hypermetabolism with max SUV 4.2, significantly decreased in size and metabolism from 9.6 x 4.5 cm with previous max SUV 17.0. No additional enlarged or hypermetabolic mediastinal lymph nodes. No hypermetabolic axillary or hilar nodes. There is relatively symmetric brown fat hypermetabolism in the bilateral axilla and throughout the bilateral paraspinal fat in the chest. Right internal jugular MediPort terminates at the cavoatrial junction. No residual significant pericardial effusion. No pleural effusions. There a few scattered 2-3 mm pulmonary nodules in the right middle lobe, which are below  PET resolution and not definitely changed. No acute consolidative airspace disease or new significant pulmonary nodules. ABDOMEN/PELVIS No abnormal hypermetabolic activity within the liver, pancreas, adrenal glands, or spleen. Max SUV in the spleen is 2.6, decreased (previous max SUV in the spleen 4.3). Spleen is now hypometabolic to the liver. No hypermetabolic lymph  nodes in the abdomen or pelvis. SKELETON No focal hypermetabolic activity to suggest skeletal metastasis. No residual hypermetabolism in the skeletal marrow. IMPRESSION: 1. Near complete metabolic response. Mild residual hypermetabolism within the right anterior mediastinal prevascular/medial right upper lobe lung mass, which is significantly decreased in size. Otherwise resolved neck, axillary, mediastinal and hilar adenopathy. Resolved splenic and marrow hypermetabolism. 2. Prominent brown fat hypermetabolism in the neck and chest. Electronically Signed   By: Ilona Sorrel M.D.   On: 07/20/2015 10:54    ASSESSMENT AND PLAN: This is a very pleasant 20 years old African-American male recently diagnosed with probably stage III nodular sclerosing Hodgkin lymphoma with bulky lymphadenopathy in the right lower cervical, right supraclavicular, right axillary as well as mediastinal lymph nodes in addition to cavitary lung masses and splenic involvement diagnosed in March 2017. He is currently on systemic chemotherapy with ABVD status post 3 cycles and currently undergoing cycle #4.  He tolerated the treatment well. I recommended for him to proceed with chemotherapy with day 15 of cycle #4 tomorrow as a scheduled.  He would come back for follow-up visit in 2 weeks for reevaluation and management of any adverse effect of his treatment.  He was advised to call immediately if he has any concerning symptoms in the interval. The patient voices understanding of current disease status and treatment options and is in agreement with the current care plan.  All questions were answered. The patient knows to call the clinic with any problems, questions or concerns. We can certainly see the patient much sooner if necessary.  Disclaimer: This note was dictated with voice recognition software. Similar sounding words can inadvertently be transcribed and may not be corrected upon review.

## 2015-08-17 ENCOUNTER — Telehealth: Payer: Self-pay | Admitting: *Deleted

## 2015-08-17 ENCOUNTER — Ambulatory Visit: Payer: Medicaid Other | Admitting: Internal Medicine

## 2015-08-17 ENCOUNTER — Other Ambulatory Visit: Payer: Self-pay | Admitting: *Deleted

## 2015-08-17 ENCOUNTER — Other Ambulatory Visit: Payer: Medicaid Other

## 2015-08-17 ENCOUNTER — Ambulatory Visit: Payer: Self-pay

## 2015-08-17 NOTE — Telephone Encounter (Signed)
I have called and gave the patient new schedule for tomorrow

## 2015-08-18 ENCOUNTER — Ambulatory Visit (HOSPITAL_BASED_OUTPATIENT_CLINIC_OR_DEPARTMENT_OTHER): Payer: Self-pay

## 2015-08-18 VITALS — BP 143/90 | HR 99 | Temp 98.1°F | Resp 18

## 2015-08-18 DIAGNOSIS — I309 Acute pericarditis, unspecified: Secondary | ICD-10-CM

## 2015-08-18 DIAGNOSIS — C8112 Nodular sclerosis classical Hodgkin lymphoma, intrathoracic lymph nodes: Secondary | ICD-10-CM

## 2015-08-18 DIAGNOSIS — Z5111 Encounter for antineoplastic chemotherapy: Secondary | ICD-10-CM

## 2015-08-18 MED ORDER — PALONOSETRON HCL INJECTION 0.25 MG/5ML
0.2500 mg | Freq: Once | INTRAVENOUS | Status: AC
  Administered 2015-08-18: 0.25 mg via INTRAVENOUS

## 2015-08-18 MED ORDER — DOXORUBICIN HCL CHEMO IV INJECTION 2 MG/ML
25.0000 mg/m2 | Freq: Once | INTRAVENOUS | Status: AC
  Administered 2015-08-18: 48 mg via INTRAVENOUS
  Filled 2015-08-18: qty 24

## 2015-08-18 MED ORDER — SODIUM CHLORIDE 0.9 % IV SOLN
Freq: Once | INTRAVENOUS | Status: AC
  Administered 2015-08-18: 15:00:00 via INTRAVENOUS
  Filled 2015-08-18: qty 5

## 2015-08-18 MED ORDER — VINBLASTINE SULFATE CHEMO INJECTION 1 MG/ML
5.7000 mg/m2 | Freq: Once | INTRAVENOUS | Status: AC
  Administered 2015-08-18: 11 mg via INTRAVENOUS
  Filled 2015-08-18: qty 11

## 2015-08-18 MED ORDER — HEPARIN SOD (PORK) LOCK FLUSH 100 UNIT/ML IV SOLN
500.0000 [IU] | Freq: Once | INTRAVENOUS | Status: AC | PRN
  Administered 2015-08-18: 500 [IU]
  Filled 2015-08-18: qty 5

## 2015-08-18 MED ORDER — SODIUM CHLORIDE 0.9 % IV SOLN
Freq: Once | INTRAVENOUS | Status: AC
  Administered 2015-08-18: 15:00:00 via INTRAVENOUS

## 2015-08-18 MED ORDER — SODIUM CHLORIDE 0.9 % IV SOLN
375.0000 mg/m2 | Freq: Once | INTRAVENOUS | Status: AC
  Administered 2015-08-18: 720 mg via INTRAVENOUS
  Filled 2015-08-18: qty 36

## 2015-08-18 MED ORDER — BLEOMYCIN SULFATE CHEMO INJECTION 30 UNIT
10.0000 [IU]/m2 | Freq: Once | INTRAMUSCULAR | Status: AC
  Administered 2015-08-18: 19 [IU] via INTRAVENOUS
  Filled 2015-08-18: qty 6.33

## 2015-08-18 MED ORDER — PALONOSETRON HCL INJECTION 0.25 MG/5ML
INTRAVENOUS | Status: AC
  Filled 2015-08-18: qty 5

## 2015-08-18 MED ORDER — SODIUM CHLORIDE 0.9% FLUSH
10.0000 mL | INTRAVENOUS | Status: DC | PRN
  Administered 2015-08-18: 10 mL
  Filled 2015-08-18: qty 10

## 2015-08-18 NOTE — Progress Notes (Signed)
Okay to treat today, despite Neut= 1.2 from 08/16/15, per Dr. Julien Nordmann.

## 2015-08-18 NOTE — Patient Instructions (Addendum)
[facility] Discharge Instructions for Patients Receiving Chemotherapy  Today you received the following chemotherapy agents Adriamycin, Velban, Bleomycin and DTIC  To help prevent nausea and vomiting after your treatment, we encourage you to take your nausea medication as prescribed.  Take Zofran 8 mg by mouth every 8 hours as needed for nausea  -  Take on  Day  4  After chemo. Can also take  Compazine  Every 6 hours as needed for nausea.  Can take Compazine tonight if needed. DO NOT Drive  After  Taking  This   Med  As  It  Can  Cause  Drowsiness. DO  NOT  Eat  Greasy  Nor  Spicy  Foods.    If you develop nausea and vomiting that is not controlled by your nausea medication, call the clinic.   BELOW ARE SYMPTOMS THAT SHOULD BE REPORTED IMMEDIATELY:  *FEVER GREATER THAN 100.5 F  *CHILLS WITH OR WITHOUT FEVER  NAUSEA AND VOMITING THAT IS NOT CONTROLLED WITH YOUR NAUSEA MEDICATION  *UNUSUAL SHORTNESS OF BREATH  *UNUSUAL BRUISING OR BLEEDING  TENDERNESS IN MOUTH AND THROAT WITH OR WITHOUT PRESENCE OF ULCERS  *URINARY PROBLEMS  *BOWEL PROBLEMS  UNUSUAL RASH Items with * indicate a potential emergency and should be followed up as soon as possible.  Feel free to call the clinic you have any questions or concerns. The clinic phone number is [phone].  Please show the [facility] at check-in to the Emergency Department and triage nurse.

## 2015-08-30 ENCOUNTER — Other Ambulatory Visit: Payer: Self-pay | Admitting: Medical Oncology

## 2015-08-30 DIAGNOSIS — C8112 Nodular sclerosis classical Hodgkin lymphoma, intrathoracic lymph nodes: Secondary | ICD-10-CM

## 2015-08-31 ENCOUNTER — Encounter: Payer: Self-pay | Admitting: Internal Medicine

## 2015-08-31 ENCOUNTER — Ambulatory Visit (HOSPITAL_BASED_OUTPATIENT_CLINIC_OR_DEPARTMENT_OTHER): Payer: Self-pay | Admitting: Internal Medicine

## 2015-08-31 ENCOUNTER — Other Ambulatory Visit (HOSPITAL_BASED_OUTPATIENT_CLINIC_OR_DEPARTMENT_OTHER): Payer: Self-pay

## 2015-08-31 ENCOUNTER — Ambulatory Visit (HOSPITAL_BASED_OUTPATIENT_CLINIC_OR_DEPARTMENT_OTHER): Payer: Self-pay

## 2015-08-31 VITALS — BP 140/94 | HR 104 | Temp 98.0°F | Resp 18 | Ht 70.0 in | Wt 183.7 lb

## 2015-08-31 DIAGNOSIS — C8112 Nodular sclerosis classical Hodgkin lymphoma, intrathoracic lymph nodes: Secondary | ICD-10-CM

## 2015-08-31 DIAGNOSIS — Z5111 Encounter for antineoplastic chemotherapy: Secondary | ICD-10-CM

## 2015-08-31 LAB — COMPREHENSIVE METABOLIC PANEL
ALBUMIN: 3.8 g/dL (ref 3.5–5.0)
ALK PHOS: 84 U/L (ref 40–150)
ALT: 13 U/L (ref 0–55)
ANION GAP: 9 meq/L (ref 3–11)
AST: 16 U/L (ref 5–34)
BUN: 8.1 mg/dL (ref 7.0–26.0)
CALCIUM: 9.8 mg/dL (ref 8.4–10.4)
CHLORIDE: 106 meq/L (ref 98–109)
CO2: 28 mEq/L (ref 22–29)
Creatinine: 0.9 mg/dL (ref 0.7–1.3)
Glucose: 100 mg/dl (ref 70–140)
POTASSIUM: 4.2 meq/L (ref 3.5–5.1)
Sodium: 142 mEq/L (ref 136–145)
Total Bilirubin: 0.37 mg/dL (ref 0.20–1.20)
Total Protein: 7.5 g/dL (ref 6.4–8.3)

## 2015-08-31 LAB — CBC WITH DIFFERENTIAL/PLATELET
BASO%: 0.5 % (ref 0.0–2.0)
Basophils Absolute: 0 10*3/uL (ref 0.0–0.1)
EOS%: 1 % (ref 0.0–7.0)
Eosinophils Absolute: 0 10*3/uL (ref 0.0–0.5)
HCT: 44.8 % (ref 38.4–49.9)
HGB: 14.9 g/dL (ref 13.0–17.1)
LYMPH%: 35.4 % (ref 14.0–49.0)
MCH: 28.4 pg (ref 27.2–33.4)
MCHC: 33.3 g/dL (ref 32.0–36.0)
MCV: 85.3 fL (ref 79.3–98.0)
MONO#: 0.4 10*3/uL (ref 0.1–0.9)
MONO%: 9.5 % (ref 0.0–14.0)
NEUT#: 2.5 10*3/uL (ref 1.5–6.5)
NEUT%: 53.6 % (ref 39.0–75.0)
PLATELETS: 298 10*3/uL (ref 140–400)
RBC: 5.25 10*6/uL (ref 4.20–5.82)
RDW: 17 % — ABNORMAL HIGH (ref 11.0–14.6)
WBC: 4.6 10*3/uL (ref 4.0–10.3)
lymph#: 1.6 10*3/uL (ref 0.9–3.3)

## 2015-08-31 MED ORDER — HEPARIN SOD (PORK) LOCK FLUSH 100 UNIT/ML IV SOLN
500.0000 [IU] | Freq: Once | INTRAVENOUS | Status: AC | PRN
  Administered 2015-08-31: 500 [IU]
  Filled 2015-08-31: qty 5

## 2015-08-31 MED ORDER — SODIUM CHLORIDE 0.9 % IV SOLN
10.0000 [IU]/m2 | Freq: Once | INTRAVENOUS | Status: AC
  Administered 2015-08-31: 19 [IU] via INTRAVENOUS
  Filled 2015-08-31: qty 6.33

## 2015-08-31 MED ORDER — PALONOSETRON HCL INJECTION 0.25 MG/5ML
0.2500 mg | Freq: Once | INTRAVENOUS | Status: AC
  Administered 2015-08-31: 0.25 mg via INTRAVENOUS

## 2015-08-31 MED ORDER — DOXORUBICIN HCL CHEMO IV INJECTION 2 MG/ML
25.0000 mg/m2 | Freq: Once | INTRAVENOUS | Status: AC
  Administered 2015-08-31: 48 mg via INTRAVENOUS
  Filled 2015-08-31: qty 24

## 2015-08-31 MED ORDER — SODIUM CHLORIDE 0.9% FLUSH
10.0000 mL | INTRAVENOUS | Status: DC | PRN
  Administered 2015-08-31: 10 mL
  Filled 2015-08-31: qty 10

## 2015-08-31 MED ORDER — PALONOSETRON HCL INJECTION 0.25 MG/5ML
INTRAVENOUS | Status: AC
  Filled 2015-08-31: qty 5

## 2015-08-31 MED ORDER — VINBLASTINE SULFATE CHEMO INJECTION 1 MG/ML
5.7000 mg/m2 | Freq: Once | INTRAVENOUS | Status: AC
  Administered 2015-08-31: 11 mg via INTRAVENOUS
  Filled 2015-08-31: qty 11

## 2015-08-31 MED ORDER — DACARBAZINE 200 MG IV SOLR
375.0000 mg/m2 | Freq: Once | INTRAVENOUS | Status: AC
  Administered 2015-08-31: 720 mg via INTRAVENOUS
  Filled 2015-08-31: qty 36

## 2015-08-31 MED ORDER — SODIUM CHLORIDE 0.9 % IV SOLN
Freq: Once | INTRAVENOUS | Status: AC
  Administered 2015-08-31: 15:00:00 via INTRAVENOUS

## 2015-08-31 MED ORDER — SODIUM CHLORIDE 0.9 % IV SOLN
Freq: Once | INTRAVENOUS | Status: AC
  Administered 2015-08-31: 15:00:00 via INTRAVENOUS
  Filled 2015-08-31: qty 5

## 2015-08-31 NOTE — Progress Notes (Signed)
[place] [phone]   [phone]  OFFICE PROGRESS NOTE  [facility] [place] [address]  DIAGNOSIS: Stage III nodular sclerosing Hodgkin lymphoma presented with cavitary masses in the right lung in addition to mediastinal, supraclavicular, axillary as well as splenic involvement diagnosed in March 2017  PRIOR THERAPY:  1) Status post pericardiocentesis with drainage of pericardial fluid secondary to Hodgkin's lymphoma. Cytology was negative for lymphoma. 2) status post subxiphoid pericardial window under the care of Dr. Roxan Hockey on 04/29/2015  CURRENT THERAPY: Systemic chemotherapy with ABVD. First dose 04/20/2015. Status post 4 cycles and he is here today to start cycle #5.   INTERVAL HISTORY: Jeffery Crane 20 y.o. male returns to the clinic today for follow-up visit accompanied by his girlfriend. The patient has no complaints today. He tolerated the last dose of his treatment fairly well with no significant adverse effects. He denied having any significant fever or chills. He denied having any current nausea or vomiting. He denied having any significant chest pain, shortness of breath and has mild cough with no hemoptysis. He is here for evaluation before starting day 1 of cycle #5.  MEDICAL HISTORY: Past Medical History:  Diagnosis Date  . Adult ADHD   . Anemia   . Chemotherapy induced neutropenia ([place]) 06/04/2015  . Encounter for antineoplastic chemotherapy 05/04/2015  . GERD (gastroesophageal reflux disease)   . Headache   . 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 Pneumonia     ALLERGIES:  is allergic to amoxicillin-pot clavulanate and bactrim.  MEDICATIONS:  Current Outpatient Prescriptions  Medication Sig Dispense Refill  . allopurinol (ZYLOPRIM) 100 MG tablet Take 1 tablet (100 mg total) by mouth 2 (two) times daily. 60 tablet 3  .  HYDROMET 5-1.5 MG/5ML syrup Reported on 06/21/2015  0  . lidocaine-prilocaine (EMLA) cream Apply 1 application topically as needed. 30 g 0  . ondansetron (ZOFRAN) 8 MG tablet Take 1 tablet (8 mg total) by mouth every 8 (eight) hours as needed for nausea or vomiting (Start on day 4 after chemotherapy and take only as needed if compazine not effecctive). 20 tablet 0  . oxyCODONE (OXY IR/ROXICODONE) 5 MG immediate release tablet Take 1-2 tablets (5-10 mg total) by mouth every 4 (four) hours as needed for severe pain. 30 tablet 0  . prochlorperazine (COMPAZINE) 10 MG tablet Reported on 06/21/2015  0   No current facility-administered medications for this visit.     SURGICAL HISTORY:  Past Surgical History:  Procedure Laterality Date  . CARDIAC CATHETERIZATION N/A 04/01/2015   Procedure: Pericardiocentesis;  Surgeon: Sherren Mocha, MD;  Location: [place] [facility];  Service: Cardiovascular;  Laterality: N/A;  . SCALENE NODE BIOPSY Right 03/15/2015   Procedure: BIOPSY SCALENE NODE;  Surgeon: [name], MD;  Location: [place];  Service: Thoracic;  Laterality: Right;  . SUBXYPHOID PERICARDIAL WINDOW  04/29/2015  . SUBXYPHOID PERICARDIAL WINDOW N/A 04/29/2015   Procedure: SUBXYPHOID PERICARDIAL WINDOW;  Surgeon: [name], MD;  Location: [place];  Service: Thoracic;  Laterality: N/A;  . SUPRACLAVICAL NODE BIOPSY Right 03/15/2015   Procedure: RIGHT SUPRACLAVICAL/CERVICAL LYMPH NODE BIOPSY;  Surgeon: [name], MD;  Location: [place];  Service: Thoracic;  Laterality: Right;  . TEE WITHOUT CARDIOVERSION N/A 04/29/2015   Procedure: TRANSESOPHAGEAL ECHOCARDIOGRAM (TEE);  Surgeon: [name], MD;  Location: [place];  Service: Thoracic;  Laterality: N/A;  .  VIDEO BRONCHOSCOPY Bilateral 01/22/2015   Procedure: VIDEO BRONCHOSCOPY WITH FLUORO;  Surgeon: Marshell Garfinkel, MD;  Location: [place];  Service: Cardiopulmonary;  Laterality: Bilateral;    REVIEW OF SYSTEMS:  A  comprehensive review of systems was negative except for: Constitutional: positive for fatigue   PHYSICAL EXAMINATION: General appearance: alert, cooperative, fatigued and no distress Head: Normocephalic, without obvious abnormality, atraumatic Neck: marked anterior cervical adenopathy and thyroid not enlarged, symmetric, no tenderness/mass/nodules Lymph nodes: Large right cervical and supraclavicular as well as a right axillary lymphadenopathy Resp: wheezes RUL Back: symmetric, no curvature. ROM normal. No CVA tenderness. Cardio: regular rate and rhythm, S1, S2 normal, no murmur, click, rub or gallop GI: soft, non-tender; bowel sounds normal; no masses,  no organomegaly Extremities: extremities normal, atraumatic, no cyanosis or edema Neurologic: Alert and oriented X 3, normal strength and tone. Normal symmetric reflexes. Normal coordination and gait   ECOG PERFORMANCE STATUS: 1 - Symptomatic but completely ambulatory  Blood pressure (!) 140/94, pulse (!) 104, temperature 98 F (36.7 C), temperature source Oral, resp. rate 18, height 5\' 10"  (1.778 m), weight 183 lb 11.2 oz (83.3 kg), SpO2 100 %.  LABORATORY DATA: Lab Results  Component Value Date   WBC 4.6 08/31/2015   HGB 14.9 08/31/2015   HCT 44.8 08/31/2015   MCV 85.3 08/31/2015   PLT 298 08/31/2015      Chemistry      Component Value Date/Time   NA 142 08/31/2015 1343   K 4.2 08/31/2015 1343   CL 104 05/01/2015 0300   CL 105 12/18/2013 0848   CO2 28 08/31/2015 1343   BUN 8.1 08/31/2015 1343   CREATININE 0.9 08/31/2015 1343      Component Value Date/Time   CALCIUM 9.8 08/31/2015 1343   ALKPHOS 84 08/31/2015 1343   AST 16 08/31/2015 1343   ALT 13 08/31/2015 1343   BILITOT 0.37 08/31/2015 1343       RADIOGRAPHIC STUDIES: No results found.  ASSESSMENT AND PLAN: This is a very pleasant 20 years old African-American male recently diagnosed with probably stage III nodular sclerosing Hodgkin lymphoma with bulky  lymphadenopathy in the right lower cervical, right supraclavicular, right axillary as well as mediastinal lymph nodes in addition to cavitary lung masses and splenic involvement diagnosed in March 2017. He is currently on systemic chemotherapy with ABVD status post 4 cycles.  He tolerated the treatment well. I recommended for him to proceed with chemotherapy with day 1 of cycle #5 today as a scheduled.  He would come back for follow-up visit in 2 weeks for reevaluation and management of any adverse effect of his treatment.  He was advised to call immediately if he has any concerning symptoms in the interval. The patient voices understanding of current disease status and treatment options and is in agreement with the current care plan.  All questions were answered. The patient knows to call the clinic with any problems, questions or concerns. We can certainly see the patient much sooner if necessary.  Disclaimer: This note was dictated with voice recognition software. Similar sounding words can inadvertently be transcribed and may not be corrected upon review.

## 2015-09-14 ENCOUNTER — Ambulatory Visit (HOSPITAL_BASED_OUTPATIENT_CLINIC_OR_DEPARTMENT_OTHER): Payer: Self-pay | Admitting: Internal Medicine

## 2015-09-14 ENCOUNTER — Encounter: Payer: Self-pay | Admitting: Internal Medicine

## 2015-09-14 ENCOUNTER — Other Ambulatory Visit (HOSPITAL_BASED_OUTPATIENT_CLINIC_OR_DEPARTMENT_OTHER): Payer: Self-pay

## 2015-09-14 ENCOUNTER — Ambulatory Visit (HOSPITAL_BASED_OUTPATIENT_CLINIC_OR_DEPARTMENT_OTHER): Payer: Self-pay

## 2015-09-14 ENCOUNTER — Telehealth: Payer: Self-pay | Admitting: Internal Medicine

## 2015-09-14 ENCOUNTER — Other Ambulatory Visit: Payer: Self-pay | Admitting: Internal Medicine

## 2015-09-14 VITALS — BP 135/88 | HR 91 | Temp 98.3°F | Resp 18 | Ht 70.0 in | Wt 189.0 lb

## 2015-09-14 DIAGNOSIS — Z5111 Encounter for antineoplastic chemotherapy: Secondary | ICD-10-CM

## 2015-09-14 DIAGNOSIS — C8112 Nodular sclerosis classical Hodgkin lymphoma, intrathoracic lymph nodes: Secondary | ICD-10-CM

## 2015-09-14 LAB — CBC WITH DIFFERENTIAL/PLATELET
BASO%: 1.2 % (ref 0.0–2.0)
BASOS ABS: 0 10*3/uL (ref 0.0–0.1)
EOS%: 3.2 % (ref 0.0–7.0)
Eosinophils Absolute: 0.1 10*3/uL (ref 0.0–0.5)
HEMATOCRIT: 40.3 % (ref 38.4–49.9)
HEMOGLOBIN: 13.8 g/dL (ref 13.0–17.1)
LYMPH#: 1.3 10*3/uL (ref 0.9–3.3)
LYMPH%: 35.4 % (ref 14.0–49.0)
MCH: 29.6 pg (ref 27.2–33.4)
MCHC: 34.3 g/dL (ref 32.0–36.0)
MCV: 86.3 fL (ref 79.3–98.0)
MONO#: 0.5 10*3/uL (ref 0.1–0.9)
MONO%: 14.2 % — ABNORMAL HIGH (ref 0.0–14.0)
NEUT#: 1.7 10*3/uL (ref 1.5–6.5)
NEUT%: 46 % (ref 39.0–75.0)
PLATELETS: 264 10*3/uL (ref 140–400)
RBC: 4.67 10*6/uL (ref 4.20–5.82)
RDW: 16.4 % — AB (ref 11.0–14.6)
WBC: 3.8 10*3/uL — ABNORMAL LOW (ref 4.0–10.3)

## 2015-09-14 LAB — COMPREHENSIVE METABOLIC PANEL
ALBUMIN: 3.5 g/dL (ref 3.5–5.0)
ALK PHOS: 82 U/L (ref 40–150)
ALT: 15 U/L (ref 0–55)
AST: 16 U/L (ref 5–34)
Anion Gap: 9 mEq/L (ref 3–11)
BUN: 9.1 mg/dL (ref 7.0–26.0)
CALCIUM: 9.2 mg/dL (ref 8.4–10.4)
CO2: 24 mEq/L (ref 22–29)
CREATININE: 0.8 mg/dL (ref 0.7–1.3)
Chloride: 107 mEq/L (ref 98–109)
EGFR: >90 mL/min/{1.73_m2} (ref >90)
Glucose: 98 mg/dl (ref 70–140)
Potassium: 4.1 mEq/L (ref 3.5–5.1)
Sodium: 140 mEq/L (ref 136–145)
Total Bilirubin: 0.3 mg/dL (ref 0.20–1.20)
Total Protein: 7.1 g/dL (ref 6.4–8.3)

## 2015-09-14 MED ORDER — DOXORUBICIN HCL CHEMO IV INJECTION 2 MG/ML
25.0000 mg/m2 | Freq: Once | INTRAVENOUS | Status: AC
  Administered 2015-09-14: 48 mg via INTRAVENOUS
  Filled 2015-09-14: qty 24

## 2015-09-14 MED ORDER — SODIUM CHLORIDE 0.9% FLUSH
10.0000 mL | INTRAVENOUS | Status: DC | PRN
  Administered 2015-09-14: 10 mL
  Filled 2015-09-14: qty 10

## 2015-09-14 MED ORDER — SODIUM CHLORIDE 0.9 % IV SOLN
Freq: Once | INTRAVENOUS | Status: AC
  Administered 2015-09-14: 15:00:00 via INTRAVENOUS
  Filled 2015-09-14: qty 5

## 2015-09-14 MED ORDER — PALONOSETRON HCL INJECTION 0.25 MG/5ML
INTRAVENOUS | Status: AC
Start: 2015-09-14 — End: 2015-09-14
  Filled 2015-09-14: qty 5

## 2015-09-14 MED ORDER — SODIUM CHLORIDE 0.9 % IV SOLN
10.0000 [IU]/m2 | Freq: Once | INTRAVENOUS | Status: AC
  Administered 2015-09-14: 19 [IU] via INTRAVENOUS
  Filled 2015-09-14: qty 6.33

## 2015-09-14 MED ORDER — SODIUM CHLORIDE 0.9 % IV SOLN
375.0000 mg/m2 | Freq: Once | INTRAVENOUS | Status: AC
  Administered 2015-09-14: 720 mg via INTRAVENOUS
  Filled 2015-09-14: qty 36

## 2015-09-14 MED ORDER — VINBLASTINE SULFATE CHEMO INJECTION 1 MG/ML
11.0000 mg | Freq: Once | INTRAVENOUS | Status: AC
  Administered 2015-09-14: 11 mg via INTRAVENOUS
  Filled 2015-09-14: qty 11

## 2015-09-14 MED ORDER — PALONOSETRON HCL INJECTION 0.25 MG/5ML
0.2500 mg | Freq: Once | INTRAVENOUS | Status: AC
  Administered 2015-09-14: 0.25 mg via INTRAVENOUS

## 2015-09-14 MED ORDER — HEPARIN SOD (PORK) LOCK FLUSH 100 UNIT/ML IV SOLN
500.0000 [IU] | Freq: Once | INTRAVENOUS | Status: AC | PRN
  Administered 2015-09-14: 500 [IU]
  Filled 2015-09-14: qty 5

## 2015-09-14 MED ORDER — SODIUM CHLORIDE 0.9 % IV SOLN
Freq: Once | INTRAVENOUS | Status: AC
  Administered 2015-09-14: 15:00:00 via INTRAVENOUS

## 2015-09-14 NOTE — Telephone Encounter (Signed)
GAVE PATIENT AVS REPORT AND APPOINTMENTS FOR September AND October  °

## 2015-09-14 NOTE — Progress Notes (Signed)
Blood return noted before, every 3 cc during and after adriamycin push. Blood return also noted prior to starting Vinblastine.

## 2015-09-14 NOTE — Patient Instructions (Signed)
[name] Discharge Instructions for Patients Receiving Chemotherapy  Today you received the following chemotherapy agents: Adriamycin, Vinblastine, Bleomycin and Dacarbazine   To help prevent nausea and vomiting after your treatment, we encourage you to take your nausea medication as directed.    If you develop nausea and vomiting that is not controlled by your nausea medication, call the clinic.   BELOW ARE SYMPTOMS THAT SHOULD BE REPORTED IMMEDIATELY:  *FEVER GREATER THAN 100.5 F  *CHILLS WITH OR WITHOUT FEVER  NAUSEA AND VOMITING THAT IS NOT CONTROLLED WITH YOUR NAUSEA MEDICATION  *UNUSUAL SHORTNESS OF BREATH  *UNUSUAL BRUISING OR BLEEDING  TENDERNESS IN MOUTH AND THROAT WITH OR WITHOUT PRESENCE OF ULCERS  *URINARY PROBLEMS  *BOWEL PROBLEMS  UNUSUAL RASH Items with * indicate a potential emergency and should be followed up as soon as possible.  Feel free to call the clinic you have any questions or concerns. The clinic phone number is [phone].  Please show the CHEMO ALERT CARD at check-in to the Emergency Department and triage nurse.   

## 2015-09-14 NOTE — Progress Notes (Signed)
1626: Blood return noted after Vinblastine infusion prior to starting Bleomycin infusion.  1641: Blood return noted after Bleomycin and prior to starting Dacarbazine.

## 2015-09-14 NOTE — Progress Notes (Signed)
[place] Telephone:(336) [phone]   Fax:(336) [phone]  OFFICE PROGRESS NOTE  [facility] [place] [address]  DIAGNOSIS: Stage III nodular sclerosing Hodgkin lymphoma presented with cavitary masses in the right lung in addition to mediastinal, supraclavicular, axillary as well as splenic involvement diagnosed in March 2017  PRIOR THERAPY:  1) Status post pericardiocentesis with drainage of pericardial fluid secondary to Hodgkin's lymphoma. Cytology was negative for lymphoma. 2) status post subxiphoid pericardial window under the care of Dr. Roxan Hockey on 04/29/2015  CURRENT THERAPY: Systemic chemotherapy with ABVD. First dose 04/20/2015. Status post 4 cycles and he is here today to start cycle #5.   INTERVAL HISTORY: Jeffery Crane 20 y.o. male returns to the clinic today for follow-up visit accompanied by his girlfriend. The patient has no complaints today. He tolerated the last dose of his treatment fairly well with no significant adverse effects except for mild nausea 1 or 2 days after his treatment. He denied having any significant fever or chills. He denied having any current nausea or vomiting. He denied having any significant chest pain, shortness of breath, cough or hemoptysis. He is here for evaluation before starting day 15 of cycle #5.  MEDICAL HISTORY: Past Medical History:  Diagnosis Date  . Adult ADHD   . Anemia   . Chemotherapy induced neutropenia ([place]) 06/04/2015  . Encounter for antineoplastic chemotherapy 05/04/2015  . GERD (gastroesophageal reflux disease)   . Headache   . 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 Pneumonia     ALLERGIES:  is allergic to amoxicillin-pot clavulanate and bactrim.  MEDICATIONS:  Current Outpatient Prescriptions  Medication Sig Dispense Refill  . allopurinol (ZYLOPRIM) 100 MG tablet Take 1 tablet (100 mg total) by mouth 2  (two) times daily. 60 tablet 3  . HYDROMET 5-1.5 MG/5ML syrup Reported on 06/21/2015  0  . lidocaine-prilocaine (EMLA) cream Apply 1 application topically as needed. 30 g 0  . ondansetron (ZOFRAN) 8 MG tablet Take 1 tablet (8 mg total) by mouth every 8 (eight) hours as needed for nausea or vomiting (Start on day 4 after chemotherapy and take only as needed if compazine not effecctive). 20 tablet 0  . oxyCODONE (OXY IR/ROXICODONE) 5 MG immediate release tablet Take 1-2 tablets (5-10 mg total) by mouth every 4 (four) hours as needed for severe pain. 30 tablet 0  . prochlorperazine (COMPAZINE) 10 MG tablet Reported on 06/21/2015  0   No current facility-administered medications for this visit.     SURGICAL HISTORY:  Past Surgical History:  Procedure Laterality Date  . CARDIAC CATHETERIZATION N/A 04/01/2015   Procedure: Pericardiocentesis;  Surgeon: Sherren Mocha, MD;  Location: [place] [facility];  Service: Cardiovascular;  Laterality: N/A;  . SCALENE NODE BIOPSY Right 03/15/2015   Procedure: BIOPSY SCALENE NODE;  Surgeon: [name], MD;  Location: [place];  Service: Thoracic;  Laterality: Right;  . SUBXYPHOID PERICARDIAL WINDOW  04/29/2015  . SUBXYPHOID PERICARDIAL WINDOW N/A 04/29/2015   Procedure: SUBXYPHOID PERICARDIAL WINDOW;  Surgeon: [name], MD;  Location: [place];  Service: Thoracic;  Laterality: N/A;  . SUPRACLAVICAL NODE BIOPSY Right 03/15/2015   Procedure: RIGHT SUPRACLAVICAL/CERVICAL LYMPH NODE BIOPSY;  Surgeon: [name], MD;  Location: [place];  Service: Thoracic;  Laterality: Right;  . TEE WITHOUT CARDIOVERSION N/A 04/29/2015   Procedure: TRANSESOPHAGEAL ECHOCARDIOGRAM (TEE);  Surgeon: [name], MD;  Location: [facility]  OR;  Service: Thoracic;  Laterality: N/A;  . VIDEO BRONCHOSCOPY Bilateral 01/22/2015   Procedure: VIDEO BRONCHOSCOPY WITH FLUORO;  Surgeon: Marshell Garfinkel, MD;  Location: [place];  Service: Cardiopulmonary;  Laterality: Bilateral;      REVIEW OF SYSTEMS:  A comprehensive review of systems was negative except for: Constitutional: positive for fatigue   PHYSICAL EXAMINATION: General appearance: alert, cooperative, fatigued and no distress Head: Normocephalic, without obvious abnormality, atraumatic Neck: marked anterior cervical adenopathy and thyroid not enlarged, symmetric, no tenderness/mass/nodules Lymph nodes: Large right cervical and supraclavicular as well as a right axillary lymphadenopathy Resp: wheezes RUL Back: symmetric, no curvature. ROM normal. No CVA tenderness. Cardio: regular rate and rhythm, S1, S2 normal, no murmur, click, rub or gallop GI: soft, non-tender; bowel sounds normal; no masses,  no organomegaly Extremities: extremities normal, atraumatic, no cyanosis or edema Neurologic: Alert and oriented X 3, normal strength and tone. Normal symmetric reflexes. Normal coordination and gait   ECOG PERFORMANCE STATUS: 1 - Symptomatic but completely ambulatory  Blood pressure 135/88, pulse 91, temperature 98.3 F (36.8 C), temperature source Oral, resp. rate 18, height 5\' 10"  (1.778 m), weight 189 lb (85.7 kg), SpO2 100 %.  LABORATORY DATA: Lab Results  Component Value Date   WBC 3.8 (L) 09/14/2015   HGB 13.8 09/14/2015   HCT 40.3 09/14/2015   MCV 86.3 09/14/2015   PLT 264 09/14/2015      Chemistry      Component Value Date/Time   NA 142 08/31/2015 1343   K 4.2 08/31/2015 1343   CL 104 05/01/2015 0300   CL 105 12/18/2013 0848   CO2 28 08/31/2015 1343   BUN 8.1 08/31/2015 1343   CREATININE 0.9 08/31/2015 1343      Component Value Date/Time   CALCIUM 9.8 08/31/2015 1343   ALKPHOS 84 08/31/2015 1343   AST 16 08/31/2015 1343   ALT 13 08/31/2015 1343   BILITOT 0.37 08/31/2015 1343       RADIOGRAPHIC STUDIES: No results found.  ASSESSMENT AND PLAN: This is a very pleasant 20 years old African-American male recently diagnosed with probably stage III nodular sclerosing Hodgkin lymphoma  with bulky lymphadenopathy in the right lower cervical, right supraclavicular, right axillary as well as mediastinal lymph nodes in addition to cavitary lung masses and splenic involvement diagnosed in March 2017. He is currently on systemic chemotherapy with ABVD status post 4 cycles.  He tolerated the treatment well. I recommended for him to proceed with chemotherapy with day 15 of cycle #5 today as a scheduled.  He would come back for follow-up visit in 2 weeks for reevaluation and management of any adverse effect of his treatment.  He was advised to call immediately if he has any concerning symptoms in the interval. The patient voices understanding of current disease status and treatment options and is in agreement with the current care plan.  All questions were answered. The patient knows to call the clinic with any problems, questions or concerns. We can certainly see the patient much sooner if necessary.  Disclaimer: This note was dictated with voice recognition software. Similar sounding words can inadvertently be transcribed and may not be corrected upon review.

## 2015-09-28 ENCOUNTER — Other Ambulatory Visit (HOSPITAL_BASED_OUTPATIENT_CLINIC_OR_DEPARTMENT_OTHER): Payer: Self-pay

## 2015-09-28 ENCOUNTER — Ambulatory Visit (HOSPITAL_BASED_OUTPATIENT_CLINIC_OR_DEPARTMENT_OTHER): Payer: Self-pay | Admitting: Internal Medicine

## 2015-09-28 ENCOUNTER — Telehealth: Payer: Self-pay | Admitting: Internal Medicine

## 2015-09-28 ENCOUNTER — Ambulatory Visit (HOSPITAL_BASED_OUTPATIENT_CLINIC_OR_DEPARTMENT_OTHER): Payer: Self-pay

## 2015-09-28 ENCOUNTER — Encounter: Payer: Self-pay | Admitting: Internal Medicine

## 2015-09-28 VITALS — BP 98/82 | HR 82 | Temp 98.4°F | Resp 17 | Ht 70.0 in | Wt 189.1 lb

## 2015-09-28 DIAGNOSIS — C8112 Nodular sclerosis classical Hodgkin lymphoma, intrathoracic lymph nodes: Secondary | ICD-10-CM

## 2015-09-28 DIAGNOSIS — Z5111 Encounter for antineoplastic chemotherapy: Secondary | ICD-10-CM

## 2015-09-28 LAB — COMPREHENSIVE METABOLIC PANEL
ALBUMIN: 3.4 g/dL — AB (ref 3.5–5.0)
ALT: 15 U/L (ref 0–55)
ANION GAP: 10 meq/L (ref 3–11)
AST: 18 U/L (ref 5–34)
Alkaline Phosphatase: 89 U/L (ref 40–150)
BILIRUBIN TOTAL: 0.44 mg/dL (ref 0.20–1.20)
BUN: 7.6 mg/dL (ref 7.0–26.0)
CALCIUM: 9.3 mg/dL (ref 8.4–10.4)
CO2: 24 mEq/L (ref 22–29)
CREATININE: 0.8 mg/dL (ref 0.7–1.3)
Chloride: 108 mEq/L (ref 98–109)
Glucose: 103 mg/dl (ref 70–140)
Potassium: 4 mEq/L (ref 3.5–5.1)
Sodium: 142 mEq/L (ref 136–145)
TOTAL PROTEIN: 6.7 g/dL (ref 6.4–8.3)

## 2015-09-28 LAB — CBC WITH DIFFERENTIAL/PLATELET
BASO%: 1.3 % (ref 0.0–2.0)
BASOS ABS: 0 10*3/uL (ref 0.0–0.1)
EOS ABS: 0.1 10*3/uL (ref 0.0–0.5)
EOS%: 2.5 % (ref 0.0–7.0)
HCT: 39.1 % (ref 38.4–49.9)
HEMOGLOBIN: 13.2 g/dL (ref 13.0–17.1)
LYMPH%: 28.5 % (ref 14.0–49.0)
MCH: 29.4 pg (ref 27.2–33.4)
MCHC: 33.7 g/dL (ref 32.0–36.0)
MCV: 87.4 fL (ref 79.3–98.0)
MONO#: 0.5 10*3/uL (ref 0.1–0.9)
MONO%: 13.8 % (ref 0.0–14.0)
NEUT%: 53.9 % (ref 39.0–75.0)
NEUTROS ABS: 2.1 10*3/uL (ref 1.5–6.5)
PLATELETS: 327 10*3/uL (ref 140–400)
RBC: 4.47 10*6/uL (ref 4.20–5.82)
RDW: 16.8 % — AB (ref 11.0–14.6)
WBC: 3.8 10*3/uL — AB (ref 4.0–10.3)
lymph#: 1.1 10*3/uL (ref 0.9–3.3)

## 2015-09-28 MED ORDER — SODIUM CHLORIDE 0.9% FLUSH
10.0000 mL | INTRAVENOUS | Status: DC | PRN
  Administered 2015-09-28: 10 mL
  Filled 2015-09-28: qty 10

## 2015-09-28 MED ORDER — VINBLASTINE SULFATE CHEMO INJECTION 1 MG/ML
5.7000 mg/m2 | Freq: Once | INTRAVENOUS | Status: AC
  Administered 2015-09-28: 11 mg via INTRAVENOUS
  Filled 2015-09-28: qty 11

## 2015-09-28 MED ORDER — HEPARIN SOD (PORK) LOCK FLUSH 100 UNIT/ML IV SOLN
500.0000 [IU] | Freq: Once | INTRAVENOUS | Status: AC | PRN
  Administered 2015-09-28: 500 [IU]
  Filled 2015-09-28: qty 5

## 2015-09-28 MED ORDER — DACARBAZINE 200 MG IV SOLR
375.0000 mg/m2 | Freq: Once | INTRAVENOUS | Status: AC
  Administered 2015-09-28: 720 mg via INTRAVENOUS
  Filled 2015-09-28: qty 36

## 2015-09-28 MED ORDER — PALONOSETRON HCL INJECTION 0.25 MG/5ML
0.2500 mg | Freq: Once | INTRAVENOUS | Status: AC
  Administered 2015-09-28: 0.25 mg via INTRAVENOUS

## 2015-09-28 MED ORDER — FOSAPREPITANT DIMEGLUMINE INJECTION 150 MG
Freq: Once | INTRAVENOUS | Status: AC
  Administered 2015-09-28: 14:00:00 via INTRAVENOUS
  Filled 2015-09-28: qty 5

## 2015-09-28 MED ORDER — DOXORUBICIN HCL CHEMO IV INJECTION 2 MG/ML
25.0000 mg/m2 | Freq: Once | INTRAVENOUS | Status: AC
  Administered 2015-09-28: 48 mg via INTRAVENOUS
  Filled 2015-09-28: qty 24

## 2015-09-28 MED ORDER — PALONOSETRON HCL INJECTION 0.25 MG/5ML
INTRAVENOUS | Status: AC
  Filled 2015-09-28: qty 5

## 2015-09-28 MED ORDER — SODIUM CHLORIDE 0.9 % IV SOLN
Freq: Once | INTRAVENOUS | Status: AC
  Administered 2015-09-28: 14:00:00 via INTRAVENOUS

## 2015-09-28 MED ORDER — SODIUM CHLORIDE 0.9 % IV SOLN
10.0000 [IU]/m2 | Freq: Once | INTRAVENOUS | Status: AC
  Administered 2015-09-28: 19 [IU] via INTRAVENOUS
  Filled 2015-09-28: qty 6.33

## 2015-09-28 NOTE — Telephone Encounter (Signed)
Gv pt appts for 10/10 + 10/24.

## 2015-09-28 NOTE — Patient Instructions (Signed)
[facility] Discharge Instructions for Patients Receiving Chemotherapy  Today you received the following chemotherapy agents: Adriamycin, Vinblastine, Bleomycin, DTIC  To help prevent nausea and vomiting after your treatment, we encourage you to take your nausea medication: Compazine 10mg  every 6 hours as needed.   If you develop nausea and vomiting that is not controlled by your nausea medication, call the clinic.   BELOW ARE SYMPTOMS THAT SHOULD BE REPORTED IMMEDIATELY:  *FEVER GREATER THAN 100.5 F  *CHILLS WITH OR WITHOUT FEVER  NAUSEA AND VOMITING THAT IS NOT CONTROLLED WITH YOUR NAUSEA MEDICATION  *UNUSUAL SHORTNESS OF BREATH  *UNUSUAL BRUISING OR BLEEDING  TENDERNESS IN MOUTH AND THROAT WITH OR WITHOUT PRESENCE OF ULCERS  *URINARY PROBLEMS  *BOWEL PROBLEMS  UNUSUAL RASH Items with * indicate a potential emergency and should be followed up as soon as possible.  Feel free to call the clinic you have any questions or concerns. The clinic phone number is [phone].  Please show the [place] at check-in to the Emergency Department and triage nurse.

## 2015-09-28 NOTE — Progress Notes (Signed)
Adriamycin administered.  Prior to pushing, found positive blood return, blood return checked during and after.  Total push time 10 minutes since is subsequent infusions

## 2015-09-28 NOTE — Progress Notes (Signed)
[place] Telephone:(336) [phone]   Fax:(336) [phone]  OFFICE PROGRESS NOTE  [facility] [place] [address]  DIAGNOSIS: Stage III nodular sclerosing Hodgkin lymphoma presented with cavitary masses in the right lung in addition to mediastinal, supraclavicular, axillary as well as splenic involvement diagnosed in March 2017  PRIOR THERAPY:  1) Status post pericardiocentesis with drainage of pericardial fluid secondary to Hodgkin's lymphoma. Cytology was negative for lymphoma. 2) status post subxiphoid pericardial window under the care of Dr. Roxan Hockey on 04/29/2015  CURRENT THERAPY: Systemic chemotherapy with ABVD. First dose 04/20/2015. Status post 5 cycles and he is here today to start cycle #6.   INTERVAL HISTORY: Jeffery Crane 20 y.o. male returns to the clinic today for follow-up visit accompanied by his girlfriend. The patient has no complaints today except for mild hearing loss. He tolerated the last dose of his treatment fairly well with no significant adverse effects except for fatigue and some skin discoloration. He denied having any significant fever or chills. He denied having any current nausea or vomiting. He denied having any significant chest pain, shortness of breath, cough or hemoptysis. He is here today to start cycle #6 of treatment.  MEDICAL HISTORY: Past Medical History:  Diagnosis Date  . Adult ADHD   . Anemia   . Chemotherapy induced neutropenia ([place]) 06/04/2015  . Encounter for antineoplastic chemotherapy 05/04/2015  . GERD (gastroesophageal reflux disease)   . Headache   . 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 Pneumonia     ALLERGIES:  is allergic to amoxicillin-pot clavulanate and bactrim.  MEDICATIONS:  Current Outpatient Prescriptions  Medication Sig Dispense Refill  . allopurinol (ZYLOPRIM) 100 MG tablet Take 1 tablet (100 mg total) by  mouth 2 (two) times daily. 60 tablet 3  . HYDROMET 5-1.5 MG/5ML syrup Reported on 06/21/2015  0  . lidocaine-prilocaine (EMLA) cream Apply 1 application topically as needed. 30 g 0  . ondansetron (ZOFRAN) 8 MG tablet Take 1 tablet (8 mg total) by mouth every 8 (eight) hours as needed for nausea or vomiting (Start on day 4 after chemotherapy and take only as needed if compazine not effecctive). 20 tablet 0  . oxyCODONE (OXY IR/ROXICODONE) 5 MG immediate release tablet Take 1-2 tablets (5-10 mg total) by mouth every 4 (four) hours as needed for severe pain. 30 tablet 0  . prochlorperazine (COMPAZINE) 10 MG tablet Reported on 06/21/2015  0   No current facility-administered medications for this visit.     SURGICAL HISTORY:  Past Surgical History:  Procedure Laterality Date  . CARDIAC CATHETERIZATION N/A 04/01/2015   Procedure: Pericardiocentesis;  Surgeon: Sherren Mocha, MD;  Location: [place] [facility];  Service: Cardiovascular;  Laterality: N/A;  . SCALENE NODE BIOPSY Right 03/15/2015   Procedure: BIOPSY SCALENE NODE;  Surgeon: [name], MD;  Location: [place];  Service: Thoracic;  Laterality: Right;  . SUBXYPHOID PERICARDIAL WINDOW  04/29/2015  . SUBXYPHOID PERICARDIAL WINDOW N/A 04/29/2015   Procedure: SUBXYPHOID PERICARDIAL WINDOW;  Surgeon: [name], MD;  Location: [place];  Service: Thoracic;  Laterality: N/A;  . SUPRACLAVICAL NODE BIOPSY Right 03/15/2015   Procedure: RIGHT SUPRACLAVICAL/CERVICAL LYMPH NODE BIOPSY;  Surgeon: [name], MD;  Location: [place];  Service: Thoracic;  Laterality: Right;  . TEE WITHOUT CARDIOVERSION N/A 04/29/2015   Procedure: TRANSESOPHAGEAL ECHOCARDIOGRAM (TEE);  Surgeon: [name], MD;  Location: [facility];  Service: Thoracic;  Laterality: N/A;  . VIDEO BRONCHOSCOPY Bilateral 01/22/2015   Procedure: VIDEO BRONCHOSCOPY WITH FLUORO;  Surgeon: Marshell Garfinkel, MD;  Location: [place];  Service: Cardiopulmonary;  Laterality:  Bilateral;    REVIEW OF SYSTEMS:  A comprehensive review of systems was negative except for: Constitutional: positive for fatigue   PHYSICAL EXAMINATION: General appearance: alert, cooperative, fatigued and no distress Head: Normocephalic, without obvious abnormality, atraumatic Neck: marked anterior cervical adenopathy and thyroid not enlarged, symmetric, no tenderness/mass/nodules Lymph nodes: Large right cervical and supraclavicular as well as a right axillary lymphadenopathy Resp: wheezes RUL Back: symmetric, no curvature. ROM normal. No CVA tenderness. Cardio: regular rate and rhythm, S1, S2 normal, no murmur, click, rub or gallop GI: soft, non-tender; bowel sounds normal; no masses,  no organomegaly Extremities: extremities normal, atraumatic, no cyanosis or edema Neurologic: Alert and oriented X 3, normal strength and tone. Normal symmetric reflexes. Normal coordination and gait   ECOG PERFORMANCE STATUS: 1 - Symptomatic but completely ambulatory  Blood pressure 98/82, pulse 82, temperature 98.4 F (36.9 C), temperature source Oral, resp. rate 17, height 5\' 10"  (1.778 m), weight 189 lb 1.6 oz (85.8 kg), SpO2 100 %.  LABORATORY DATA: Lab Results  Component Value Date   WBC 3.8 (L) 09/28/2015   HGB 13.2 09/28/2015   HCT 39.1 09/28/2015   MCV 87.4 09/28/2015   PLT 327 09/28/2015      Chemistry      Component Value Date/Time   NA 142 09/28/2015 1210   K 4.0 09/28/2015 1210   CL 104 05/01/2015 0300   CL 105 12/18/2013 0848   CO2 24 09/28/2015 1210   BUN 7.6 09/28/2015 1210   CREATININE 0.8 09/28/2015 1210      Component Value Date/Time   CALCIUM 9.3 09/28/2015 1210   ALKPHOS 89 09/28/2015 1210   AST 18 09/28/2015 1210   ALT 15 09/28/2015 1210   BILITOT 0.44 09/28/2015 1210       RADIOGRAPHIC STUDIES: No results found.  ASSESSMENT AND PLAN: This is a very pleasant 20 years old African-American male recently diagnosed with probably stage III nodular sclerosing  Hodgkin lymphoma with bulky lymphadenopathy in the right lower cervical, right supraclavicular, right axillary as well as mediastinal lymph nodes in addition to cavitary lung masses and splenic involvement diagnosed in March 2017. He is currently on systemic chemotherapy with ABVD status post 5 cycles.  He tolerated the treatment well. I recommended for him to proceed with chemotherapy with day 1 of cycle #6 today as a scheduled.  He would come back for follow-up visit in 2 weeks for reevaluation and management of any adverse effect of his treatment.  He was advised to call immediately if he has any concerning symptoms in the interval. The patient voices understanding of current disease status and treatment options and is in agreement with the current care plan.  All questions were answered. The patient knows to call the clinic with any problems, questions or concerns. We can certainly see the patient much sooner if necessary.  Disclaimer: This note was dictated with voice recognition software. Similar sounding words can inadvertently be transcribed and may not be corrected upon review.

## 2015-10-12 ENCOUNTER — Other Ambulatory Visit: Payer: Self-pay

## 2015-10-12 ENCOUNTER — Ambulatory Visit: Payer: Self-pay | Admitting: Internal Medicine

## 2015-10-12 ENCOUNTER — Ambulatory Visit: Payer: Self-pay

## 2015-10-16 ENCOUNTER — Encounter (HOSPITAL_COMMUNITY): Payer: Self-pay

## 2015-10-16 ENCOUNTER — Emergency Department (HOSPITAL_COMMUNITY)
Admission: EM | Admit: 2015-10-16 | Discharge: 2015-10-17 | Disposition: A | Discharge status: Home / Self Care | Payer: Self-pay | Attending: Emergency Medicine | Admitting: Emergency Medicine

## 2015-10-16 ENCOUNTER — Emergency Department (HOSPITAL_COMMUNITY): Payer: Self-pay

## 2015-10-16 DIAGNOSIS — F909 Attention-deficit hyperactivity disorder, unspecified type: Secondary | ICD-10-CM | POA: Insufficient documentation

## 2015-10-16 DIAGNOSIS — Z87891 Personal history of nicotine dependence: Secondary | ICD-10-CM | POA: Insufficient documentation

## 2015-10-16 DIAGNOSIS — J984 Other disorders of lung: Secondary | ICD-10-CM | POA: Diagnosis present

## 2015-10-16 DIAGNOSIS — Z79899 Other long term (current) drug therapy: Secondary | ICD-10-CM | POA: Insufficient documentation

## 2015-10-16 DIAGNOSIS — J189 Pneumonia, unspecified organism: Secondary | ICD-10-CM | POA: Insufficient documentation

## 2015-10-16 LAB — CBC WITH DIFFERENTIAL/PLATELET
BASOS PCT: 1 %
Basophils Absolute: 0 10*3/uL (ref 0.0–0.1)
Eosinophils Absolute: 0.1 10*3/uL (ref 0.0–0.7)
Eosinophils Relative: 2 %
HEMATOCRIT: 37.1 % — AB (ref 39.0–52.0)
HEMOGLOBIN: 12.6 g/dL — AB (ref 13.0–17.0)
LYMPHS ABS: 1.4 10*3/uL (ref 0.7–4.0)
Lymphocytes Relative: 28 %
MCH: 29.7 pg (ref 26.0–34.0)
MCHC: 34 g/dL (ref 30.0–36.0)
MCV: 87.5 fL (ref 78.0–100.0)
MONOS PCT: 13 %
Monocytes Absolute: 0.6 10*3/uL (ref 0.1–1.0)
NEUTROS ABS: 2.8 10*3/uL (ref 1.7–7.7)
NEUTROS PCT: 56 %
Platelets: 279 10*3/uL (ref 150–400)
RBC: 4.24 MIL/uL (ref 4.22–5.81)
RDW: 14.7 % (ref 11.5–15.5)
WBC: 4.8 10*3/uL (ref 4.0–10.5)

## 2015-10-16 LAB — I-STAT CHEM 8, ED
BUN: 5 mg/dL — ABNORMAL LOW (ref 6–20)
CHLORIDE: 103 mmol/L (ref 101–111)
CREATININE: 0.8 mg/dL (ref 0.61–1.24)
Calcium, Ion: 1.21 mmol/L (ref 1.15–1.40)
GLUCOSE: 94 mg/dL (ref 65–99)
HEMATOCRIT: 37 % — AB (ref 39.0–52.0)
Hemoglobin: 12.6 g/dL — ABNORMAL LOW (ref 13.0–17.0)
POTASSIUM: 3.8 mmol/L (ref 3.5–5.1)
Sodium: 142 mmol/L (ref 135–145)
TCO2: 27 mmol/L (ref 0–100)

## 2015-10-16 LAB — I-STAT TROPONIN, ED: Troponin i, poc: 0.03 ng/mL (ref 0.00–0.08)

## 2015-10-16 LAB — I-STAT CG4 LACTIC ACID, ED: LACTIC ACID, VENOUS: 0.85 mmol/L (ref 0.5–1.9)

## 2015-10-16 MED ORDER — IOPAMIDOL (ISOVUE-370) INJECTION 76%
100.0000 mL | Freq: Once | INTRAVENOUS | Status: AC | PRN
  Administered 2015-10-16: 75 mL via INTRAVENOUS

## 2015-10-16 MED ORDER — SODIUM CHLORIDE 0.9 % IV SOLN
INTRAVENOUS | Status: DC
  Administered 2015-10-16: 23:00:00 via INTRAVENOUS

## 2015-10-16 MED ORDER — SODIUM CHLORIDE 0.9 % IV BOLUS (SEPSIS)
500.0000 mL | Freq: Once | INTRAVENOUS | Status: AC
  Administered 2015-10-16: 500 mL via INTRAVENOUS

## 2015-10-16 MED ORDER — VANCOMYCIN HCL IN DEXTROSE 1-5 GM/200ML-% IV SOLN
1000.0000 mg | Freq: Once | INTRAVENOUS | Status: AC
  Administered 2015-10-16: 1000 mg via INTRAVENOUS
  Filled 2015-10-16: qty 200

## 2015-10-16 MED ORDER — DEXTROSE 5 % IV SOLN
2.0000 g | Freq: Once | INTRAVENOUS | Status: AC
  Administered 2015-10-16: 2 g via INTRAVENOUS
  Filled 2015-10-16: qty 2

## 2015-10-16 NOTE — ED Notes (Signed)
Bed: [id number] Expected date:  Expected time:  Means of arrival:  Comments: Please Hold for Triage 3: DT

## 2015-10-16 NOTE — ED Provider Notes (Signed)
[facility] DEPT Provider Note   CSN: [id number] Arrival date & time: 10/16/15  1904     History   Chief Complaint Chief Complaint  Patient presents with  . Shortness of Breath    HPI Jeffery Crane is a 20 y.o. male.  He presents for evaluation of shortness of breath for one half weeks. He feels a cool sensation, in his lungs when he takes a deep breath. He denies chest pain, nausea, vomiting, fever, chills, cough, weakness or dizziness. His lymphoma being treated as an outpatient with chemotherapy, last about 3 weeks ago. No prior history of cardiac problems, or PE. He is taking his medications as prescribed. He has not smoke cigarettes, drink alcohol or use illegal drugs. There are no other known modifying factors.  HPI  Past Medical History:  Diagnosis Date  . Adult ADHD   . Anemia   . Chemotherapy induced neutropenia ([place]) 06/04/2015  . Encounter for antineoplastic chemotherapy 05/04/2015  . GERD (gastroesophageal reflux disease)   . Headache   . 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 Pneumonia     Patient Active Problem List   Diagnosis Date Noted  . Chemotherapy induced neutropenia ([place]) 06/04/2015  . 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 Encounter for antineoplastic chemotherapy 05/04/2015  . Pericardial effusion with cardiac tamponade 03/30/2015  . Acute pericardial effusion 03/29/2015  . Pericardial effusion 03/29/2015  . 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 CAP (community acquired pneumonia)   . Cavitary lung disease   . Cavitary lesion of lung 01/19/2015  . Leukocytosis 01/19/2015  . Thrombocytosis ([place]) 01/19/2015  . Anemia 01/19/2015    Past Surgical History:  Procedure Laterality Date  . CARDIAC CATHETERIZATION N/A 04/01/2015   Procedure: Pericardiocentesis;  Surgeon: Sherren Mocha, MD;  Location: [facility];  Service: Cardiovascular;   Laterality: N/A;  . SCALENE NODE BIOPSY Right 03/15/2015   Procedure: BIOPSY SCALENE NODE;  Surgeon: [name], MD;  Location: [facility];  Service: Thoracic;  Laterality: Right;  . SUBXYPHOID PERICARDIAL WINDOW  04/29/2015  . SUBXYPHOID PERICARDIAL WINDOW N/A 04/29/2015   Procedure: SUBXYPHOID PERICARDIAL WINDOW;  Surgeon: [name], MD;  Location: [facility];  Service: Thoracic;  Laterality: N/A;  . SUPRACLAVICAL NODE BIOPSY Right 03/15/2015   Procedure: RIGHT SUPRACLAVICAL/CERVICAL LYMPH NODE BIOPSY;  Surgeon: [name], MD;  Location: [place];  Service: Thoracic;  Laterality: Right;  . TEE WITHOUT CARDIOVERSION N/A 04/29/2015   Procedure: TRANSESOPHAGEAL ECHOCARDIOGRAM (TEE);  Surgeon: [name], MD;  Location: [facility];  Service: Thoracic;  Laterality: N/A;  . VIDEO BRONCHOSCOPY Bilateral 01/22/2015   Procedure: VIDEO BRONCHOSCOPY WITH FLUORO;  Surgeon: Marshell Garfinkel, MD;  Location: [place];  Service: Cardiopulmonary;  Laterality: Bilateral;       Home Medications    Prior to Admission medications   Medication Sig Start Date End Date Taking? Authorizing Provider  lidocaine-prilocaine (EMLA) cream Apply 1 application topically as needed. 04/06/15  Yes Curt Bears, MD  ondansetron (ZOFRAN) 8 MG tablet Take 1 tablet (8 mg total) by mouth every 8 (eight) hours as needed for nausea or vomiting (Start on day 4 after chemotherapy and take only as needed if compazine not effecctive). 08/03/15  Yes Curt Bears, MD  allopurinol (ZYLOPRIM) 100 MG tablet Take 1 tablet (100 mg total) by mouth 2 (two) times daily. Patient not taking: Reported on 10/16/2015 03/26/15   Curt Bears, MD  oxyCODONE (OXY  IR/ROXICODONE) 5 MG immediate release tablet Take 1-2 tablets (5-10 mg total) by mouth every 4 (four) hours as needed for severe pain. Patient not taking: Reported on 10/16/2015 06/04/15   Curt Bears, MD    Family History Family History  Problem Relation Age of  Onset  . Heart disease Maternal Grandfather     Social History Social History  Substance Use Topics  . Smoking status: Former Smoker    Quit date: 09/16/2015  . Smokeless tobacco: Never Used  . Alcohol use Yes     Comment: occasionally     Allergies   Amoxicillin-pot clavulanate and Bactrim   Review of Systems Review of Systems  All other systems reviewed and are negative.    Physical Exam Updated Vital Signs BP 123/78   Pulse 97   Temp 98.3 F (36.8 C) (Oral)   Resp 16   Ht 5\' 11"  (1.803 m)   Wt 184 lb (83.5 kg)   SpO2 100%   BMI 25.66 kg/m   Physical Exam  Constitutional: He is oriented to person, place, and time. He appears well-developed and well-nourished. No distress.  HENT:  Head: Normocephalic and atraumatic.  Right Ear: External ear normal.  Left Ear: External ear normal.  Eyes: Conjunctivae and EOM are normal. Pupils are equal, round, and reactive to light.  Neck: Normal range of motion and phonation normal. Neck supple.  Cardiovascular: Normal rate, regular rhythm and normal heart sounds.   Pulmonary/Chest: Effort normal and breath sounds normal. No respiratory distress. He has no wheezes. He exhibits no bony tenderness.  Abdominal: Soft. There is no tenderness.  Musculoskeletal: Normal range of motion.  Neurological: He is alert and oriented to person, place, and time. No cranial nerve deficit or sensory deficit. He exhibits normal muscle tone. Coordination normal.  Skin: Skin is warm, dry and intact.  Psychiatric: He has a normal mood and affect. His behavior is normal. Judgment and thought content normal.  Nursing note and vitals reviewed.    ED Treatments / Results  Labs (all labs ordered are listed, but only abnormal results are displayed) Labs Reviewed  CBC WITH DIFFERENTIAL/PLATELET - Abnormal; Notable for the following:       Result Value   Hemoglobin 12.6 (*)    HCT 37.1 (*)    All other components within normal limits  I-STAT CHEM  8, ED - Abnormal; Notable for the following:    BUN 5 (*)    Hemoglobin 12.6 (*)    HCT 37.0 (*)    All other components within normal limits  CULTURE, BLOOD (ROUTINE X 2)  CULTURE, BLOOD (ROUTINE X 2)  I-STAT TROPOININ, ED  I-STAT CG4 LACTIC ACID, ED    EKG  EKG Interpretation  Date/Time:  Saturday October 16 2015 19:25:53 EDT Ventricular Rate:  100 PR Interval:    QRS Duration: 98 QT Interval:  325 QTC Calculation: 420 R Axis:   113 Text Interpretation:  Sinus tachycardia Right axis deviation Since last tracing R axis is shifted Confirmed by Eulis Foster  MD, Rosea Dory [phone]) on 10/16/2015 7:45:36 PM       Radiology Dg Chest 2 View  Result Date: 10/16/2015 CLINICAL DATA:  Patient with history of lymphoma. EXAM: CHEST  2 VIEW COMPARISON:  PET-CT 07/20/2015; chest radiograph 05/01/2015. FINDINGS: Monitoring leads overlie the patient. Right anterior chest wall Port-A-Cath is present with tip projecting over the superior vena cava. When compared to prior chest radiograph, interval decrease in right hilar mass. Interval development of focal nodular  opacity within the left mid lung measuring 10 mm. Additionally there are right-greater-than-left mid and lower lung patchy areas of consolidation. No pleural effusion or pneumothorax. Thoracic spine degenerative changes. IMPRESSION: Right-greater-than-left lower lung patchy areas of consolidation may represent infection. Metastatic disease is not excluded. Focal nodule within the left mid lung is nonspecific. Recommend short term radiographic followup. If findings persist, chest CT would be warranted. Electronically Signed   By: Lovey Newcomer M.D.   On: 10/16/2015 20:02   Ct Angio Chest Pe W/cm &/or Wo Cm  Result Date: 10/16/2015 CLINICAL DATA:  Acute onset of shortness of breath. Initial encounter. EXAM: CT ANGIOGRAPHY CHEST WITH CONTRAST TECHNIQUE: Multidetector CT imaging of the chest was performed using the standard protocol during bolus  administration of intravenous contrast. Multiplanar CT image reconstructions and MIPs were obtained to evaluate the vascular anatomy. CONTRAST:  100 mL of Isovue 370 IV contrast COMPARISON:  Chest radiograph performed earlier today at 7:31 p.m., and PET/CT performed 07/20/2015 FINDINGS: Cardiovascular: There is no evidence of significant pulmonary embolus. The heart is unremarkable in appearance. The thoracic aorta is unremarkable in appearance. No calcific atherosclerotic disease is seen. The great vessels are unremarkable. Mediastinum/Nodes: The right anterior mediastinal mass has decreased slightly in size, measuring 3.5 x 2.6 cm. Minimal underlying calcification is noted. No additional mediastinal lymphadenopathy is seen. No pericardial effusion is identified. The thyroid gland is unremarkable. No axillary lymphadenopathy is appreciated. A right-sided chest port is noted ending about the distal SVC. Lungs/Pleura: Scattered bilateral patchy airspace opacities are seen, concerning for diffuse multifocal pneumonia. No pleural effusion or pneumothorax is seen. No dominant masses are identified. Upper Abdomen: The visualized portions of the liver and spleen are grossly unremarkable. Musculoskeletal: No acute osseous abnormalities are identified. The visualized musculature is unremarkable in appearance. Review of the MIP images confirms the above findings. IMPRESSION: 1. No evidence of significant pulmonary embolus. 2. Scattered bilateral patchy airspace opacities, concerning for diffuse multifocal pneumonia. 3. 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 Electronically Signed   By: Garald Balding M.D.   On: 10/16/2015 21:52    Procedures Procedures (including critical care time)  Medications Ordered in ED Medications  0.9 %  sodium chloride infusion ( Intravenous New  Bag/Given 10/16/15 2245)  vancomycin (VANCOCIN) IVPB 1000 mg/200 mL premix (1,000 mg Intravenous New Bag/Given 10/16/15 2312)  iopamidol (ISOVUE-370) 76 % injection 100 mL (75 mLs Intravenous Contrast Given 10/16/15 2117)  aztreonam (AZACTAM) 2 g in dextrose 5 % 50 mL IVPB (0 g Intravenous Stopped 10/16/15 2311)  sodium chloride 0.9 % bolus 500 mL (0 mLs Intravenous Stopped 10/16/15 2311)     Initial Impression / Assessment and Plan / ED Course  I have reviewed the triage vital signs and the nursing notes.  Pertinent labs & imaging results that were available during my care of the patient were reviewed by me and considered in my medical decision making (see chart for details).  Clinical Course    Medications  0.9 %  sodium chloride infusion ( Intravenous New Bag/Given 10/16/15 2245)  vancomycin (VANCOCIN) IVPB 1000 mg/200 mL premix (1,000 mg Intravenous New Bag/Given 10/16/15 2312)  iopamidol (ISOVUE-370) 76 % injection 100 mL (75 mLs Intravenous Contrast Given 10/16/15 2117)  aztreonam (AZACTAM) 2 g in dextrose 5 % 50 mL IVPB (0 g Intravenous Stopped 10/16/15 2311)  sodium chloride 0.9 % bolus 500 mL (0 mLs  Intravenous Stopped 10/16/15 2311)    Patient Vitals for the past 24 hrs:  BP Temp Temp src Pulse Resp SpO2 Height Weight  10/16/15 2300 123/78 - - - 16 100 % - -  10/16/15 2247 148/79 - - 97 20 100 % - -  10/16/15 2246 - - - - - - 5\' 11"  (1.803 m) 184 lb (83.5 kg)  10/16/15 2200 148/79 - - - - 100 % - -  10/16/15 2130 132/86 - - - 24 100 % - -  10/16/15 2100 116/79 - - - 23 99 % - -  10/16/15 2030 116/80 - - - 23 96 % - -  10/16/15 2000 120/78 - - - 22 98 % - -  10/16/15 1916 112/81 98.3 F (36.8 C) Oral 113 20 94 % - -    23:20- case discussed with hospitalist, Dr. Loleta Books. He feels that the patient can possibly be treated as an outpatient with oral antibiotics. He agrees to evaluate the patient here, before consideration for admission.  11:26 PM Reevaluation with update  and discussion. After initial assessment and treatment, an updated evaluation reveals He remains comfortable, has no further complaints and would like to go home.Daleen Bo L   Final Clinical Impressions(s) / ED Diagnoses   Final diagnoses:  HCAP (healthcare-associated pneumonia)    Patient with lymphoma, currently receiving chemotherapy, presents with mild respiratory discomfort. He is not in respiratory distress. CT imaging reveals incidental pneumonia, multifocal. Patient is receiving chemotherapy therefor this may be consistent with a healthcare associated pneumonia. Initial treatment begun for HCAP. Hospitalist evaluation for possible admission.  Nursing Notes Reviewed/ Care Coordinated, and agree without changes. Applicable Imaging Reviewed.  Interpretation of Laboratory Data incorporated into ED treatment  New Prescriptions New Prescriptions   No medications on file     Daleen Bo, MD 10/17/15 1148

## 2015-10-16 NOTE — ED Triage Notes (Signed)
Patient c/o SOB x1 -2 weeks.  Patient states that became more concerned today.  States that has "a sensation" when he takes a deep breath.  Patient denies chest pain.  Denies any pain at all.  Patient airway is patent, breathing regular and unlabored.  O2 94% RA.  Patient with Hx of cancer.

## 2015-10-17 ENCOUNTER — Encounter (HOSPITAL_COMMUNITY): Payer: Self-pay | Admitting: Family Medicine

## 2015-10-17 DIAGNOSIS — J984 Other disorders of lung: Secondary | ICD-10-CM | POA: Diagnosis present

## 2015-10-17 DIAGNOSIS — J189 Pneumonia, unspecified organism: Secondary | ICD-10-CM | POA: Diagnosis present

## 2015-10-17 HISTORY — DX: Other disorders of lung: J98.4

## 2015-10-17 LAB — PROCALCITONIN

## 2015-10-17 NOTE — Consult Note (Signed)
Medicine Service Consult Note  Patient Name: Jeffery Crane     [id number]    DOB: 09-03-1995    DOA: 10/16/2015 PCP: [facility]   Patient coming from: Home  Chief Complaint: Dyspnea on exertion  HPI: Jeffery Crane is a 20 y.o. 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  The patient was in his usual state of health until about three weeks ago when he started to notice dyspnea with exertion.  At first this was mild and he thought maybe it was just in his head.  It was accompanied by a sensation "like when you have a strong mint and you get that cool breeze in your chest" and "like you wanna cough".  However, he had no cough, no hemoptysis, no sputum, no chest congestion.    Last night he was moving some small things in the house and had to stop and rest and catch his breath several times, which he recognized was abnormal so today he called his Oncologist's office and was told to be evaluated in the ER.  He has had no fevers or chills, no sweats, malaise and no chest pain or discomfort.  He had no leg swelling.  ED course: -Afebrile, heart rate 100-120, respirations normal, SpO2 normal at rest and with ambulation on room air, BP 120/70 -Na 142, K 3.8, Cr 0.8, WBC 4.8K, Hgb 12.6, troponin normal, lactic acid normal -CXR showed multifocal patchy opacities -CTA chest showed no PE or dissection but did shows multifocal opacities concerning for multifocal pneumonia -He was given vancomycin and Azactam for HCAP and TRH were asked to evaluate for admission    The patient was diagnosed with nodular sclerosing Hodgkin's disease in January, underwent no radiation, and just recently finished his sixth and final cycle of ABVD, including bleomycin.  He does not smoke regularly, last a cigar >1 month ago, THC longer ago than that, and is intermittently around second hand  smoke.        Review of Systems:  Review of Systems  Constitutional: Negative for chills, diaphoresis, fever, malaise/fatigue and weight loss.  Respiratory: Positive for shortness of breath. Negative for cough, hemoptysis, sputum production and wheezing.   Cardiovascular: Negative for chest pain, palpitations, orthopnea, leg swelling and PND.  All other systems reviewed and are negative.       Past Medical History:  Diagnosis Date  . Adult ADHD   . Anemia   . Chemotherapy induced neutropenia ([place]) 06/04/2015  . Encounter for antineoplastic chemotherapy 05/04/2015  . GERD (gastroesophageal reflux disease)   . Headache   . 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 Pneumonia     Past Surgical History:  Procedure Laterality Date  . CARDIAC CATHETERIZATION N/A 04/01/2015   Procedure: Pericardiocentesis;  Surgeon: Sherren Mocha, MD;  Location: [facility];  Service: Cardiovascular;  Laterality: N/A;  . SCALENE NODE BIOPSY Right 03/15/2015   Procedure: BIOPSY SCALENE NODE;  Surgeon: [name], MD;  Location: [facility];  Service: Thoracic;  Laterality: Right;  . SUBXYPHOID PERICARDIAL WINDOW  04/29/2015  . SUBXYPHOID PERICARDIAL WINDOW N/A 04/29/2015   Procedure: SUBXYPHOID PERICARDIAL WINDOW;  Surgeon: [name], MD;  Location: [facility];  Service: Thoracic;  Laterality: N/A;  . SUPRACLAVICAL NODE BIOPSY Right 03/15/2015   Procedure: RIGHT SUPRACLAVICAL/CERVICAL LYMPH NODE BIOPSY;  Surgeon: [name], MD;  [facility];  Service: Thoracic;  Laterality: Right;  . TEE WITHOUT CARDIOVERSION N/A 04/29/2015   Procedure: TRANSESOPHAGEAL ECHOCARDIOGRAM (TEE);  Surgeon: [name], MD;  Location: [facility];  Service: Thoracic;  Laterality: N/A;  . VIDEO BRONCHOSCOPY Bilateral 01/22/2015   Procedure: VIDEO BRONCHOSCOPY WITH FLUORO;  Surgeon: Marshell Garfinkel, MD;  Location: [facility];  Service: Cardiopulmonary;   Laterality: Bilateral;    Social History: Patient lives with his girlfriend.  Patient walks unassisted.  He works at [employer] third shift.  He smokes intermittently, is somewhat evasive about this and THC, states no tobacco in >47month.    Allergies  Allergen Reactions  . Amoxicillin-Pot Clavulanate Hives and Rash    Has patient had a PCN reaction causing immediate rash, facial/tongue/throat swelling, SOB or lightheadedness with hypotension:  Has patient had a PCN reaction causing severe rash involving mucus membranes or skin necrosis:  Has patient had a PCN reaction that required hospitalization  Has patient had a PCN reaction occurring within the last 10 years:  If all of the above answers are "NO", then may proceed with Cephalosporin use.   . Bactrim Other (See Comments)    Unknown children reaction per pt's mom    Family history: family history includes Heart disease in his maternal grandfather; Lung disease in his maternal grandfather; Narcolepsy in his mother.       Prior to Admission medications   Medication Sig Start Date End Date Taking? Authorizing Provider  lidocaine-prilocaine (EMLA) cream Apply 1 application topically as needed. 04/06/15  Yes Curt Bears, MD  ondansetron (ZOFRAN) 8 MG tablet Take 1 tablet (8 mg total) by mouth every 8 (eight) hours as needed for nausea or vomiting (Start on day 4 after chemotherapy and take only as needed if compazine not effecctive). 08/03/15  Yes Curt Bears, MD  allopurinol (ZYLOPRIM) 100 MG tablet Take 1 tablet (100 mg total) by mouth 2 (two) times daily. Patient not taking: Reported on 10/16/2015 03/26/15   Curt Bears, MD  oxyCODONE (OXY IR/ROXICODONE) 5 MG immediate release tablet Take 1-2 tablets (5-10 mg total) by mouth every 4 (four) hours as needed for severe pain. Patient not taking: Reported on 10/16/2015 06/04/15   Curt Bears, MD      Physical Exam: BP 123/78   Pulse 97   Temp 98.3 F (36.8 C) (Oral)   Resp  16   Ht 5\' 11"  (1.803 m)   Wt 83.5 kg (184 lb)   SpO2 100%   SpO2 95-100% on ambulation  BMI 25.66 kg/m   General appearance: Well-developed, adult male, alert and in no acute distress.   Eyes: Anicteric, conjunctiva pink, lids and lashes normal. PERRL.    ENT: No nasal deformity, discharge, epistaxis.  Hearing normal. OP moist without lesions.   Neck: No neck masses.  Trachea midline.  No thyromegaly/tenderness. Lymph: No cervical or supraclavicular lymphadenopathy. Skin: Warm and dry.  No jaundice.  No suspicious rashes or lesions. Cardiac: Tachycardic, regular, nl S1-S2, no murmurs appreciated, no rubs.  Capillary refill is brisk.  No JVD.  No LE edema.  Radial and DP pulses 2+ and symmetric. Respiratory: Normal respiratory rate and rhythm.  Breathing appears comfortable while walking.  Refrains from taking a deep breath.  No definite rales appreciated.  No wheezing. MSK: No deformities or effusions.  No cyanosis or clubbing. Neuro: Cranial nerves normal.  Sensation intact to light touch. Speech is fluent.  Muscle strength normal.    Psych: Sensorium intact and responding to questions,  attention normal.  Behavior appropriate.  Affect normal.  Judgment and insight appear normal.      Labs on Admission:  I have personally reviewed the following studies: The metabolic panel shows normal electroyltes and renal function. The complete blood count shows no leukocytosis, eosinophil count normal, normal platelets. Lactic acid normal. Procalcitonin undetectable. Troponin negative.       Radiological Exams on Admission: Personally reviewed: Dg Chest 2 View  Result Date: 10/16/2015 CLINICAL DATA:  Patient with history of lymphoma. EXAM: CHEST  2 VIEW COMPARISON:  PET-CT 07/20/2015; chest radiograph 05/01/2015. FINDINGS: Monitoring leads overlie the patient. Right anterior chest wall Port-A-Cath is present with tip projecting over the superior vena cava. When compared to prior chest  radiograph, interval decrease in right hilar mass. Interval development of focal nodular opacity within the left mid lung measuring 10 mm. Additionally there are right-greater-than-left mid and lower lung patchy areas of consolidation. No pleural effusion or pneumothorax. Thoracic spine degenerative changes. IMPRESSION: Right-greater-than-left lower lung patchy areas of consolidation may represent infection. Metastatic disease is not excluded. Focal nodule within the left mid lung is nonspecific. Recommend short term radiographic followup. If findings persist, chest CT would be warranted. Electronically Signed   By: Lovey Newcomer M.D.   On: 10/16/2015 20:02   Ct Angio Chest Pe W/cm &/or Wo Cm  Result Date: 10/16/2015 CLINICAL DATA:  Acute onset of shortness of breath. Initial encounter. EXAM: CT ANGIOGRAPHY CHEST WITH CONTRAST TECHNIQUE: Multidetector CT imaging of the chest was performed using the standard protocol during bolus administration of intravenous contrast. Multiplanar CT image reconstructions and MIPs were obtained to evaluate the vascular anatomy. CONTRAST:  100 mL of Isovue 370 IV contrast COMPARISON:  Chest radiograph performed earlier today at 7:31 p.m., and PET/CT performed 07/20/2015 FINDINGS: Cardiovascular: There is no evidence of significant pulmonary embolus. The heart is unremarkable in appearance. The thoracic aorta is unremarkable in appearance. No calcific atherosclerotic disease is seen. The great vessels are unremarkable. Mediastinum/Nodes: The right anterior mediastinal mass has decreased slightly in size, measuring 3.5 x 2.6 cm. Minimal underlying calcification is noted. No additional mediastinal lymphadenopathy is seen. No pericardial effusion is identified. The thyroid gland is unremarkable. No axillary lymphadenopathy is appreciated. A right-sided chest port is noted ending about the distal SVC. Lungs/Pleura: Scattered bilateral patchy airspace opacities are seen, concerning for  diffuse multifocal pneumonia. No pleural effusion or pneumothorax is seen. No dominant masses are identified. Upper Abdomen: The visualized portions of the liver and spleen are grossly unremarkable. Musculoskeletal: No acute osseous abnormalities are identified. The visualized musculature is unremarkable in appearance. Review of the MIP images confirms the above findings. IMPRESSION: 1. No evidence of significant pulmonary embolus. 2. Scattered bilateral patchy airspace opacities, concerning for diffuse multifocal pneumonia. 3. 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 Electronically Signed   By: Garald Balding M.D.   On: 10/16/2015 21:52    EKG: Independently reviewed. Rate 100, QTc 420, right axis, new.  No ST segment changes.    Assessment/Plan 1. Pneumonitis:  The case was discussed with radiology who doubted metastasis of Hodgkin's based on the lung CT pattern.  Their primary concern was for multifocal infectious pneumonia versus non-infectious pneumonitis.   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  Although procalcitonin has decreased NPV in setting  of someone on chemotherapy, his clinical picture is not consistent at all with infectious pneumonia.  Patient was adamant to leave and avoid hospitalization.  Given his symptoms have been present for ~1 month, his oxygen saturation is normal, and he feels comfortable and eager to go to work tonight, I would favor observing him until Pulm and/or Oncology evaluation tomorrow, but that this does not at present seem emergent.    I explained that I suspect he may be asked by his Oncologist to be admitted on Monday for expedited workup, and he agreed in that case.  -Defer  antibiotics. -Strict return precautions given: he will return for any worsening AT ALL of symptoms, fever, cough, malaise. -I will forward an InBasket message to Dr. Julien Nordmann. -He will call his Oncologist's office on Monday.         Medical decision making: Patient seen at 12:05 AM on 10/17/2015.  The patient was discussed with Dr. Eulis Foster and Dr. Elsworth Soho.  What exists of the patient's chart was reviewed in depth and summarized above.  Clinical condition: stable.          Edwin Dada [facility] Pager [phone]

## 2015-10-17 NOTE — Discharge Instructions (Signed)
Return to the ER for fever, chills, cough, coughing up blood or mucus, worsening breathing trouble, or any feeling worse. Call Dr. Worthy Flank office on Monday.

## 2015-10-20 ENCOUNTER — Encounter (HOSPITAL_COMMUNITY): Payer: Self-pay | Admitting: Emergency Medicine

## 2015-10-20 ENCOUNTER — Ambulatory Visit (HOSPITAL_COMMUNITY)
Admission: EM | Admit: 2015-10-20 | Discharge: 2015-10-20 | Disposition: A | Discharge status: Home / Self Care | Payer: Self-pay | Attending: Family Medicine | Admitting: Family Medicine

## 2015-10-20 DIAGNOSIS — H10011 Acute follicular conjunctivitis, right eye: Secondary | ICD-10-CM

## 2015-10-20 DIAGNOSIS — H1131 Conjunctival hemorrhage, right eye: Secondary | ICD-10-CM

## 2015-10-20 MED ORDER — GENTAMICIN SULFATE 0.3 % OP SOLN: 2.0000 [drp] | Freq: Four times a day (QID) | OPHTHALMIC | 0 refills | Status: DC

## 2015-10-20 MED ORDER — FLUORESCEIN SODIUM 1 MG OP STRP
ORAL_STRIP | OPHTHALMIC | Status: AC
  Filled 2015-10-20: qty 1

## 2015-10-20 MED ORDER — TETRACAINE HCL 0.5 % OP SOLN
OPHTHALMIC | Status: AC
  Filled 2015-10-20: qty 2

## 2015-10-20 NOTE — ED Triage Notes (Signed)
The patient presented to the [facility] with a complaint of a right eye irritation and redness. The patient stated that 3 days ago while he inpatient at the hospital he felt as if something was in his right eye. He stated that it cleared up on its own and he then got some "juice" in his eye while he was at work and it again became very red and irritated.

## 2015-10-22 LAB — CULTURE, BLOOD (ROUTINE X 2)
CULTURE: NO GROWTH
Culture: NO GROWTH

## 2015-10-22 NOTE — ED Provider Notes (Signed)
CSN: [id number]     Arrival date & time 10/20/15  1552 History   First MD Initiated Contact with Patient 10/20/15 1713     Chief Complaint  Patient presents with  . Eye Problem   (Consider location/radiation/quality/duration/timing/severity/associated sxs/prior Treatment) HPI 20 y/o male with red draining eye.  Past Medical History:  Diagnosis Date  . Adult ADHD   . Anemia   . Chemotherapy induced neutropenia ([place]) 06/04/2015  . Encounter for antineoplastic chemotherapy 05/04/2015  . GERD (gastroesophageal reflux disease)   . Headache   . 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 Pneumonia    Past Surgical History:  Procedure Laterality Date  . CARDIAC CATHETERIZATION N/A 04/01/2015   Procedure: Pericardiocentesis;  Surgeon: Sherren Mocha, MD;  Location: [facility];  Service: Cardiovascular;  Laterality: N/A;  . SCALENE NODE BIOPSY Right 03/15/2015   Procedure: BIOPSY SCALENE NODE;  Surgeon: [name], MD;  Location: [facility];  Service: Thoracic;  Laterality: Right;  . SUBXYPHOID PERICARDIAL WINDOW  04/29/2015  . SUBXYPHOID PERICARDIAL WINDOW N/A 04/29/2015   Procedure: SUBXYPHOID PERICARDIAL WINDOW;  Surgeon: [name], MD;  Location: [facility];  Service: Thoracic;  Laterality: N/A;  . SUPRACLAVICAL NODE BIOPSY Right 03/15/2015   Procedure: RIGHT SUPRACLAVICAL/CERVICAL LYMPH NODE BIOPSY;  Surgeon: [name], MD;  Location: [facility];  Service: Thoracic;  Laterality: Right;  . TEE WITHOUT CARDIOVERSION N/A 04/29/2015   Procedure: TRANSESOPHAGEAL ECHOCARDIOGRAM (TEE);  Surgeon: [name], MD;  Location: [facility];  Service: Thoracic;  Laterality: N/A;  . VIDEO BRONCHOSCOPY Bilateral 01/22/2015   Procedure: VIDEO BRONCHOSCOPY WITH FLUORO;  Surgeon: Marshell Garfinkel, MD;  Location: [facility];  Service: Cardiopulmonary;  Laterality: Bilateral;   Family History  Problem Relation Age of Onset  . Narcolepsy Mother    . Heart disease Maternal Grandfather   . Lung disease Maternal Grandfather     From smoking   Social History  Substance Use Topics  . Smoking status: Former Smoker    Quit date: 09/16/2015  . Smokeless tobacco: Never Used  . Alcohol use Yes     Comment: occasionally    Review of Systems  Denies: HEADACHE, NAUSEA, ABDOMINAL PAIN, CHEST PAIN, CONGESTION, DYSURIA, SHORTNESS OF BREATH  Allergies  Amoxicillin-pot clavulanate and Bactrim  Home Medications   Prior to Admission medications   Medication Sig Start Date End Date Taking? Authorizing Provider  allopurinol (ZYLOPRIM) 100 MG tablet Take 1 tablet (100 mg total) by mouth 2 (two) times daily. Patient not taking: Reported on 10/16/2015 03/26/15   Curt Bears, MD  gentamicin (GARAMYCIN) 0.3 % ophthalmic solution Place 2 drops into the right eye 4 (four) times daily. 10/20/15   Konrad Felix, PA  lidocaine-prilocaine (EMLA) cream Apply 1 application topically as needed. 04/06/15   Curt Bears, MD  ondansetron (ZOFRAN) 8 MG tablet Take 1 tablet (8 mg total) by mouth every 8 (eight) hours as needed for nausea or vomiting (Start on day 4 after chemotherapy and take only as needed if compazine not effecctive). 08/03/15   Curt Bears, MD  oxyCODONE (OXY IR/ROXICODONE) 5 MG immediate release tablet Take 1-2 tablets (5-10 mg total) by mouth every 4 (four) hours as needed for severe pain. Patient not taking: Reported on 10/16/2015 06/04/15   Curt Bears, MD   Meds Ordered and Administered this Visit  Medications - No data to display  BP 107/77 (BP Location: Right Arm)   Pulse 107   Temp 98.4  F (36.9 C) (Oral)   Resp 20   SpO2 98%  No data found.   Physical Exam NURSES NOTES AND VITAL SIGNS REVIEWED. CONSTITUTIONAL: Well developed, well nourished, no acute distress HEENT: normocephalic, atraumatic EYES: Conjunctiva normal left eye. Right eye is injected with [facility].  NECK:normal ROM, supple, no adenopathy PULMONARY:No  respiratory distress, normal effort ABDOMINAL: Soft, ND, NT BS+, No CVAT MUSCULOSKELETAL: Normal ROM of all extremities,  SKIN: warm and dry without rash PSYCHIATRIC: Mood and affect, behavior are normal  Urgent Care Course   Clinical Course    Procedures (including critical care time)  Labs Review Labs Reviewed - No data to display  Imaging Review No results found.   Visual Acuity Review  Right Eye Distance:   Left Eye Distance:   Bilateral Distance:    Right Eye Near:   Left Eye Near:    Bilateral Near:         MDM   1. Subconjunctival hemorrhage of right eye   2. Acute follicular conjunctivitis of right eye     Patient is reassured that there are no issues that require transfer to higher level of care at this time or additional tests. Patient is advised to continue home symptomatic treatment. Patient is advised that if there are new or worsening symptoms to attend the emergency department, contact primary care provider, or return to UC. Instructions of care provided discharged home in stable condition.    THIS NOTE WAS GENERATED USING A VOICE RECOGNITION SOFTWARE PROGRAM. ALL REASONABLE EFFORTS  WERE MADE TO PROOFREAD THIS DOCUMENT FOR ACCURACY.  I have verbally reviewed the discharge instructions with the patient. A printed AVS was given to the patient.  All questions were answered prior to discharge.      Konrad Felix, [place] 10/22/15 [phone]

## 2015-10-26 ENCOUNTER — Ambulatory Visit (HOSPITAL_BASED_OUTPATIENT_CLINIC_OR_DEPARTMENT_OTHER): Payer: Self-pay | Admitting: Internal Medicine

## 2015-10-26 ENCOUNTER — Other Ambulatory Visit (HOSPITAL_BASED_OUTPATIENT_CLINIC_OR_DEPARTMENT_OTHER): Payer: Self-pay

## 2015-10-26 ENCOUNTER — Telehealth: Payer: Self-pay | Admitting: Medical Oncology

## 2015-10-26 ENCOUNTER — Encounter: Payer: Self-pay | Admitting: Internal Medicine

## 2015-10-26 ENCOUNTER — Ambulatory Visit: Payer: Self-pay

## 2015-10-26 VITALS — BP 135/88 | HR 94 | Temp 97.9°F | Resp 18 | Ht 71.0 in | Wt 190.6 lb

## 2015-10-26 DIAGNOSIS — Z5111 Encounter for antineoplastic chemotherapy: Secondary | ICD-10-CM

## 2015-10-26 DIAGNOSIS — C8112 Nodular sclerosis classical Hodgkin lymphoma, intrathoracic lymph nodes: Secondary | ICD-10-CM

## 2015-10-26 DIAGNOSIS — H109 Unspecified conjunctivitis: Secondary | ICD-10-CM

## 2015-10-26 LAB — COMPREHENSIVE METABOLIC PANEL
ALBUMIN: 3.1 g/dL — AB (ref 3.5–5.0)
ALK PHOS: 112 U/L (ref 40–150)
ALT: 17 U/L (ref 0–55)
AST: 20 U/L (ref 5–34)
Anion Gap: 9 mEq/L (ref 3–11)
BUN: 8.5 mg/dL (ref 7.0–26.0)
CO2: 26 mEq/L (ref 22–29)
Calcium: 8.8 mg/dL (ref 8.4–10.4)
Chloride: 107 mEq/L (ref 98–109)
Creatinine: 0.8 mg/dL (ref 0.7–1.3)
EGFR: >90 mL/min/{1.73_m2} (ref >90)
GLUCOSE: 99 mg/dL (ref 70–140)
POTASSIUM: 3.9 meq/L (ref 3.5–5.1)
SODIUM: 142 meq/L (ref 136–145)
Total Bilirubin: <0.22 mg/dL (ref 0.20–1.20)
Total Protein: 7 g/dL (ref 6.4–8.3)

## 2015-10-26 LAB — CBC WITH DIFFERENTIAL/PLATELET
BASO%: 1 % (ref 0.0–2.0)
Basophils Absolute: 0 10*3/uL (ref 0.0–0.1)
EOS%: 2.5 % (ref 0.0–7.0)
Eosinophils Absolute: 0.1 10*3/uL (ref 0.0–0.5)
HCT: 39.1 % (ref 38.4–49.9)
HEMOGLOBIN: 13.2 g/dL (ref 13.0–17.1)
LYMPH%: 33.3 % (ref 14.0–49.0)
MCH: 29.5 pg (ref 27.2–33.4)
MCHC: 33.8 g/dL (ref 32.0–36.0)
MCV: 87.3 fL (ref 79.3–98.0)
MONO#: 0.7 10*3/uL (ref 0.1–0.9)
MONO%: 13.4 % (ref 0.0–14.0)
NEUT%: 49.8 % (ref 39.0–75.0)
NEUTROS ABS: 2.4 10*3/uL (ref 1.5–6.5)
Platelets: 375 10*3/uL (ref 140–400)
RBC: 4.48 10*6/uL (ref 4.20–5.82)
RDW: 15 % — AB (ref 11.0–14.6)
WBC: 4.9 10*3/uL (ref 4.0–10.3)
lymph#: 1.6 10*3/uL (ref 0.9–3.3)

## 2015-10-26 NOTE — Telephone Encounter (Signed)
I gave pt his appt tomorrow with Dr Kathlen Mody at 1230 , arrive at 1215 and the address for [facility] opthamology. He said he will be there. Records faxed.

## 2015-10-26 NOTE — Progress Notes (Signed)
[place] [phone]   [phone]  OFFICE PROGRESS NOTE  [facility] [place] [address]  DIAGNOSIS: Stage III nodular sclerosing Hodgkin lymphoma presented with cavitary masses in the right lung in addition to mediastinal, supraclavicular, axillary as well as splenic involvement diagnosed in March 2017  PRIOR THERAPY:  1) Status post pericardiocentesis with drainage of pericardial fluid secondary to Hodgkin's lymphoma. Cytology was negative for lymphoma. 2) status post subxiphoid pericardial window under the care of Dr. Roxan Hockey on 04/29/2015  CURRENT THERAPY: Systemic chemotherapy with ABVD. First dose 04/20/2015. Status post 5 cycles.   INTERVAL HISTORY: Jeffery Crane 20 y.o. male returns to the clinic today for follow-up visit accompanied by his mother. The patient was recently admitted to the hospital with pneumonia. He is feeling better but he has pink eye started a week ago and with no improvement on the current treatment with antibiotics eyedrops. His son and girlfriend has the same issue. She was supposed to start day 15 of cycle #6 today. He denied having any significant fever or chills. He denied having any current nausea or vomiting. He denied having any significant chest pain, shortness of breath, cough or hemoptysis.   MEDICAL HISTORY: Past Medical History:  Diagnosis Date  . Adult ADHD   . Anemia   . Chemotherapy induced neutropenia ([place]) 06/04/2015  . Encounter for antineoplastic chemotherapy 05/04/2015  . GERD (gastroesophageal reflux disease)   . Headache   . 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 Pneumonia     ALLERGIES:  is allergic to amoxicillin-pot clavulanate and bactrim.  MEDICATIONS:  Current Outpatient Prescriptions  Medication Sig Dispense Refill  . allopurinol (ZYLOPRIM) 100 MG tablet Take 1 tablet (100 mg total) by  mouth 2 (two) times daily. (Patient not taking: Reported on 10/16/2015) 60 tablet 3  . gentamicin (GARAMYCIN) 0.3 % ophthalmic solution Place 2 drops into the right eye 4 (four) times daily. 5 mL 0  . lidocaine-prilocaine (EMLA) cream Apply 1 application topically as needed. 30 g 0  . ondansetron (ZOFRAN) 8 MG tablet Take 1 tablet (8 mg total) by mouth every 8 (eight) hours as needed for nausea or vomiting (Start on day 4 after chemotherapy and take only as needed if compazine not effecctive). 20 tablet 0  . oxyCODONE (OXY IR/ROXICODONE) 5 MG immediate release tablet Take 1-2 tablets (5-10 mg total) by mouth every 4 (four) hours as needed for severe pain. (Patient not taking: Reported on 10/16/2015) 30 tablet 0   No current facility-administered medications for this visit.    Facility-Administered Medications Ordered in Other Visits  Medication Dose Route Frequency Provider Last Rate Last Dose  . sodium chloride flush (NS) 0.9 % injection 10 mL  10 mL Intracatheter PRN Curt Bears, MD   10 mL at 09/28/15 1654    SURGICAL HISTORY:  Past Surgical History:  Procedure Laterality Date  . CARDIAC CATHETERIZATION N/A 04/01/2015   Procedure: Pericardiocentesis;  Surgeon: Sherren Mocha, MD;  Location: [facility];  Service: Cardiovascular;  Laterality: N/A;  . SCALENE NODE BIOPSY Right 03/15/2015   Procedure: BIOPSY SCALENE NODE;  Surgeon: [name], MD;  Location: [facility];  Service: Thoracic;  Laterality: Right;  . SUBXYPHOID PERICARDIAL WINDOW  04/29/2015  . SUBXYPHOID PERICARDIAL WINDOW N/A 04/29/2015   Procedure: SUBXYPHOID PERICARDIAL WINDOW;  Surgeon: [name], MD;  Location: [facility];  Service: Thoracic;  Laterality: N/A;  . SUPRACLAVICAL NODE BIOPSY Right 03/15/2015   Procedure: RIGHT SUPRACLAVICAL/CERVICAL LYMPH NODE BIOPSY;  Surgeon: [name], MD;  Location: [facility];  Service: Thoracic;  Laterality: Right;  . TEE WITHOUT CARDIOVERSION N/A 04/29/2015    Procedure: TRANSESOPHAGEAL ECHOCARDIOGRAM (TEE);  Surgeon: [name], MD;  Location: [facility];  Service: Thoracic;  Laterality: N/A;  . VIDEO BRONCHOSCOPY Bilateral 01/22/2015   Procedure: VIDEO BRONCHOSCOPY WITH FLUORO;  Surgeon: Marshell Garfinkel, MD;  Location: [facility];  Service: Cardiopulmonary;  Laterality: Bilateral;    REVIEW OF SYSTEMS:  A comprehensive review of systems was negative except for: Constitutional: positive for fatigue Eyes: positive for redness Respiratory: positive for cough   PHYSICAL EXAMINATION: General appearance: alert, cooperative, fatigued and no distress Head: Normocephalic, without obvious abnormality, atraumatic Neck: marked anterior cervical adenopathy and thyroid not enlarged, symmetric, no tenderness/mass/nodules Lymph nodes: Large right cervical and supraclavicular as well as a right axillary lymphadenopathy Resp: wheezes RUL Back: symmetric, no curvature. ROM normal. No CVA tenderness. Cardio: regular rate and rhythm, S1, S2 normal, no murmur, click, rub or gallop GI: soft, non-tender; bowel sounds normal; no masses,  no organomegaly Extremities: extremities normal, atraumatic, no cyanosis or edema Neurologic: Alert and oriented X 3, normal strength and tone. Normal symmetric reflexes. Normal coordination and gait   ECOG PERFORMANCE STATUS: 1 - Symptomatic but completely ambulatory  Blood pressure 135/88, pulse 94, temperature 97.9 F (36.6 C), temperature source Oral, resp. rate 18, height 5\' 11"  (1.803 m), weight 190 lb 9.6 oz (86.5 kg), SpO2 100 %.  LABORATORY DATA: Lab Results  Component Value Date   WBC 4.9 10/26/2015   HGB 13.2 10/26/2015   HCT 39.1 10/26/2015   MCV 87.3 10/26/2015   PLT 375 10/26/2015      Chemistry      Component Value Date/Time   NA 142 10/16/2015 2006   NA 142 09/28/2015 1210   K 3.8 10/16/2015 2006   K 4.0 09/28/2015 1210   CL 103 10/16/2015 2006   CL 105 12/18/2013 0848   CO2 24 09/28/2015 1210    BUN 5 (L) 10/16/2015 2006   BUN 7.6 09/28/2015 1210   CREATININE 0.80 10/16/2015 2006   CREATININE 0.8 09/28/2015 1210      Component Value Date/Time   CALCIUM 9.3 09/28/2015 1210   ALKPHOS 89 09/28/2015 1210   AST 18 09/28/2015 1210   ALT 15 09/28/2015 1210   BILITOT 0.44 09/28/2015 1210       RADIOGRAPHIC STUDIES: Dg Chest 2 View  Result Date: 10/16/2015 CLINICAL DATA:  Patient with history of lymphoma. EXAM: CHEST  2 VIEW COMPARISON:  PET-CT 07/20/2015; chest radiograph 05/01/2015. FINDINGS: Monitoring leads overlie the patient. Right anterior chest wall Port-A-Cath is present with tip projecting over the superior vena cava. When compared to prior chest radiograph, interval decrease in right hilar mass. Interval development of focal nodular opacity within the left mid lung measuring 10 mm. Additionally there are right-greater-than-left mid and lower lung patchy areas of consolidation. No pleural effusion or pneumothorax. Thoracic spine degenerative changes. IMPRESSION: Right-greater-than-left lower lung patchy areas of consolidation may represent infection. Metastatic disease is not excluded. Focal nodule within the left mid lung is nonspecific. Recommend short term radiographic followup. If findings persist, chest CT would be warranted. Electronically Signed   By: Lovey Newcomer M.D.   On: 10/16/2015 20:02   Ct Angio Chest Pe W/cm &/or Wo Cm  Result Date: 10/16/2015 CLINICAL DATA:  Acute onset of shortness of  breath. Initial encounter. EXAM: CT ANGIOGRAPHY CHEST WITH CONTRAST TECHNIQUE: Multidetector CT imaging of the chest was performed using the standard protocol during bolus administration of intravenous contrast. Multiplanar CT image reconstructions and MIPs were obtained to evaluate the vascular anatomy. CONTRAST:  100 mL of Isovue 370 IV contrast COMPARISON:  Chest radiograph performed earlier today at 7:31 p.m., and PET/CT performed 07/20/2015 FINDINGS: Cardiovascular: There is no  evidence of significant pulmonary embolus. The heart is unremarkable in appearance. The thoracic aorta is unremarkable in appearance. No calcific atherosclerotic disease is seen. The great vessels are unremarkable. Mediastinum/Nodes: The right anterior mediastinal mass has decreased slightly in size, measuring 3.5 x 2.6 cm. Minimal underlying calcification is noted. No additional mediastinal lymphadenopathy is seen. No pericardial effusion is identified. The thyroid gland is unremarkable. No axillary lymphadenopathy is appreciated. A right-sided chest port is noted ending about the distal SVC. Lungs/Pleura: Scattered bilateral patchy airspace opacities are seen, concerning for diffuse multifocal pneumonia. No pleural effusion or pneumothorax is seen. No dominant masses are identified. Upper Abdomen: The visualized portions of the liver and spleen are grossly unremarkable. Musculoskeletal: No acute osseous abnormalities are identified. The visualized musculature is unremarkable in appearance. Review of the MIP images confirms the above findings. IMPRESSION: 1. No evidence of significant pulmonary embolus. 2. Scattered bilateral patchy airspace opacities, concerning for diffuse multifocal pneumonia. 3. 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 Electronically Signed   By: Garald Balding M.D.   On: 10/16/2015 21:52    ASSESSMENT AND PLAN: This is a very pleasant 20 years old African-American male recently diagnosed with probably stage III nodular sclerosing Hodgkin lymphoma with bulky lymphadenopathy in the right lower cervical, right supraclavicular, right axillary as well as mediastinal lymph nodes in addition to cavitary lung masses and splenic involvement diagnosed in March 2017. He is currently on systemic chemotherapy with ABVD status post 5 cycles and day 1 of cycle  #6.  He tolerated the treatment well. He was recently treated for pneumonia and currently has conjunctivitis of the right eye. I recommended for the patient to delay the start of day 15 of cycle #6 by 1 week until improvement of his general condition. For the persistent conjunctivitis, we will refer the patient to ophthalmology for evaluation. He would come back for follow-up visit in 4 weeks for reevaluation with repeat PET scan for restaging of his disease.  He was advised to call immediately if he has any concerning symptoms in the interval. The patient voices understanding of current disease status and treatment options and is in agreement with the current care plan.  All questions were answered. The patient knows to call the clinic with any problems, questions or concerns. We can certainly see the patient much sooner if necessary.  Disclaimer: This note was dictated with voice recognition software. Similar sounding words can inadvertently be transcribed and may not be corrected upon review.

## 2015-11-03 ENCOUNTER — Telehealth: Payer: Self-pay | Admitting: *Deleted

## 2015-11-03 NOTE — Telephone Encounter (Signed)
Mother wants to cancel any treatment until they can talk with Dr Julien Nordmann and make sure pneumonia has resolved. Will call back on Monday.

## 2015-11-05 ENCOUNTER — Ambulatory Visit: Payer: Self-pay

## 2015-11-10 ENCOUNTER — Telehealth: Payer: Self-pay | Admitting: General Practice

## 2015-11-10 NOTE — Telephone Encounter (Signed)
Left message regarding November 2017 appt.

## 2015-11-18 ENCOUNTER — Other Ambulatory Visit: Payer: Self-pay

## 2015-11-19 ENCOUNTER — Ambulatory Visit (HOSPITAL_COMMUNITY): Admission: RE | Admit: 2015-11-19 | Payer: Self-pay | Source: Ambulatory Visit

## 2015-11-22 ENCOUNTER — Ambulatory Visit: Payer: Self-pay | Admitting: Internal Medicine

## 2016-04-25 IMAGING — CT CT CHEST W/O CM
2 of 3 series · 15 of 36 positions shown, 18 images · non-contrast
Comparison: 01/19/2015

CLINICAL DATA: Follow-up community-acquired pneumonia.

EXAM:
CT CHEST WITHOUT CONTRAST
TECHNIQUE: Multidetector CT imaging of the chest was performed following the
standard protocol without IV contrast.

[Series 2: thorax · axial · 0.68mm/px · z∈[-162,+103]mm · 12 of 63 slices shown, 15 images]
[im 5/63  mediastinal]
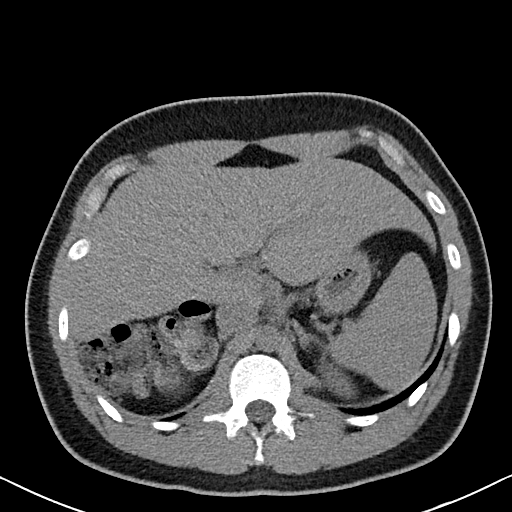
[im 5/63  lung]
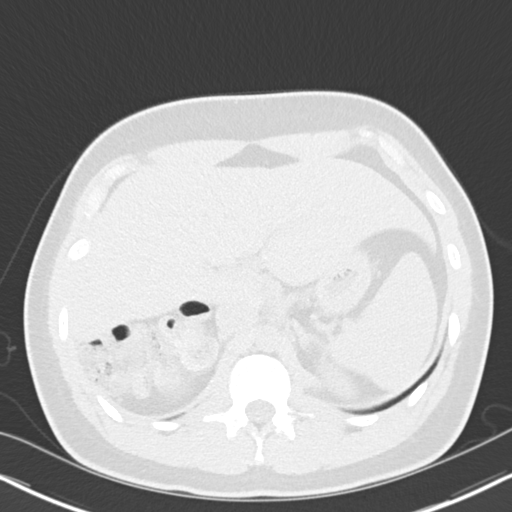
[im 10/63  lung]
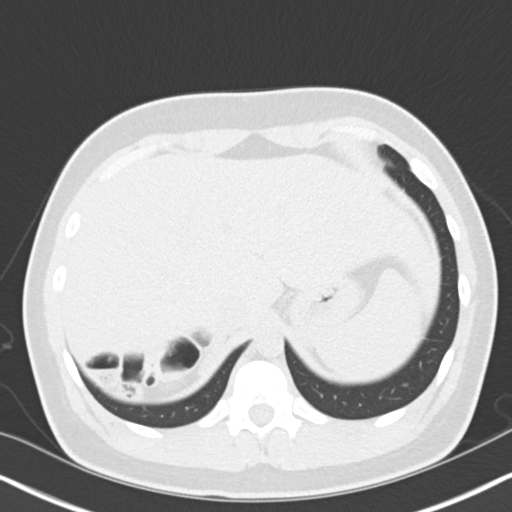
[im 14/63  lung]
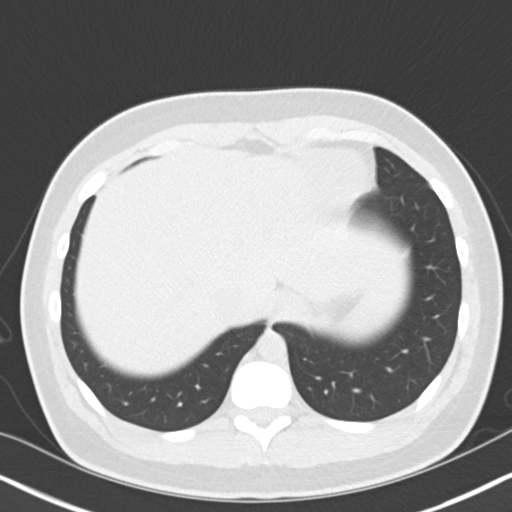
[im 19/63  lung]
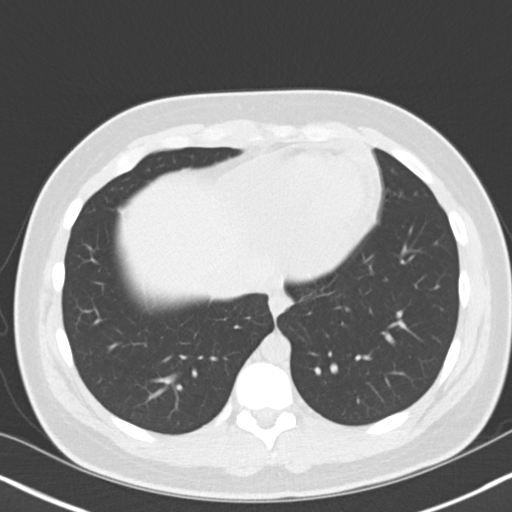
[im 23/63  mediastinal]
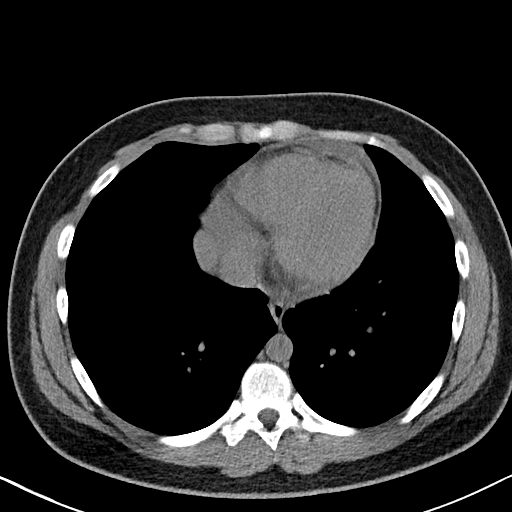
[im 23/63  lung]
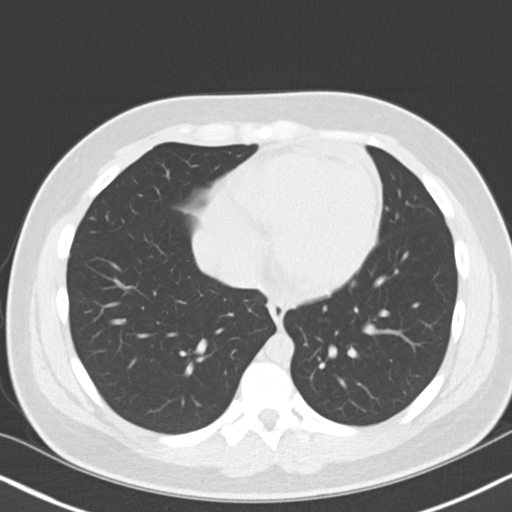
[im 28/63  lung]
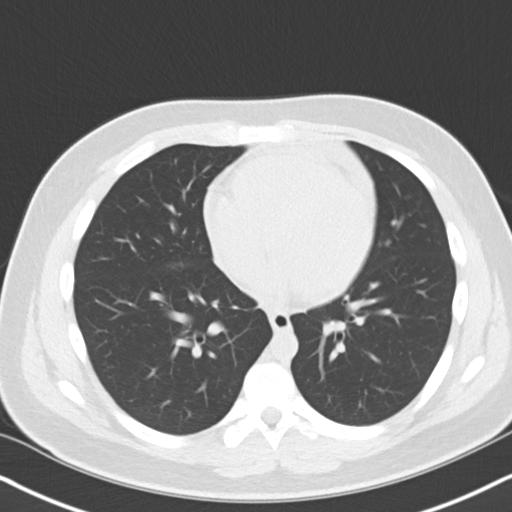
[im 35/63  lung]
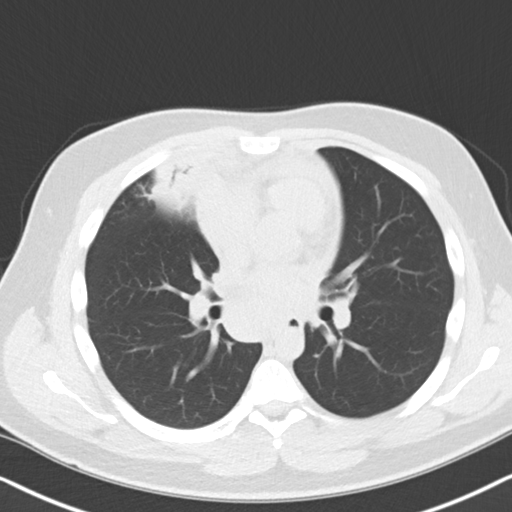
[im 40/63  lung]
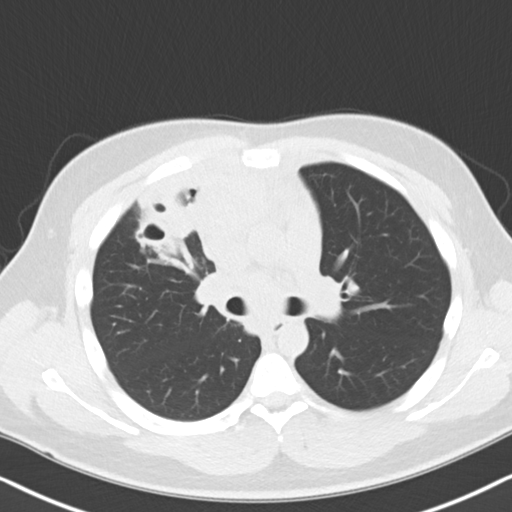
[im 44/63  mediastinal]
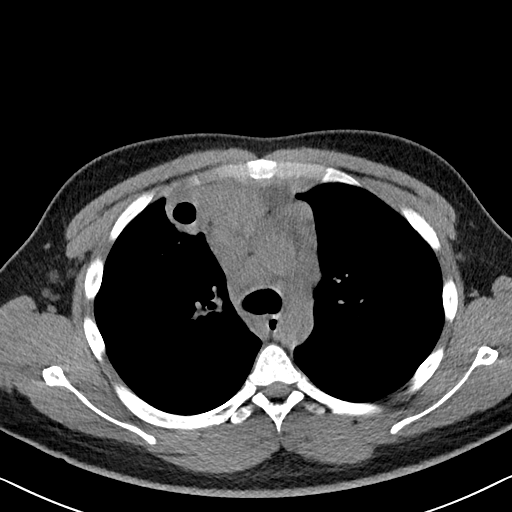
[im 44/63  lung]
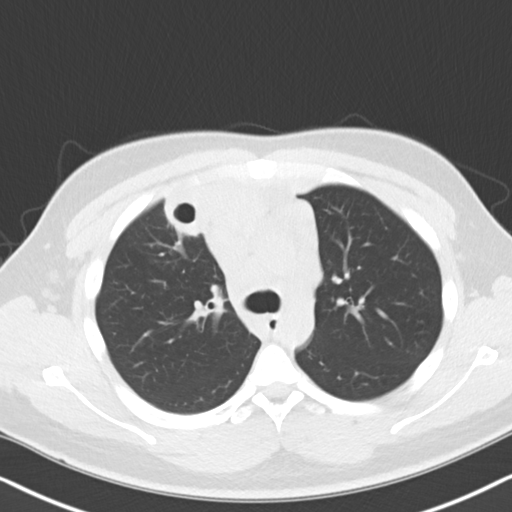
[im 49/63  lung]
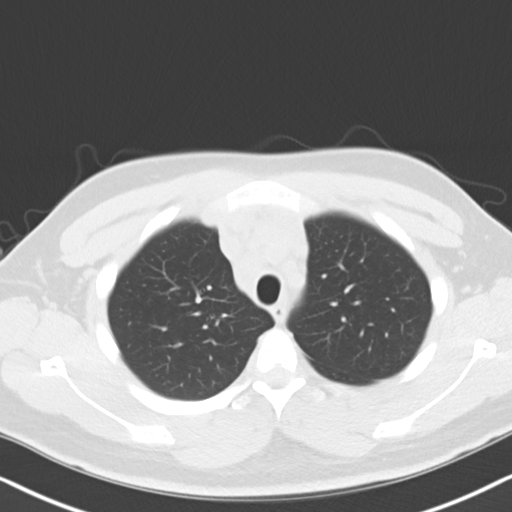
[im 53/63  lung]
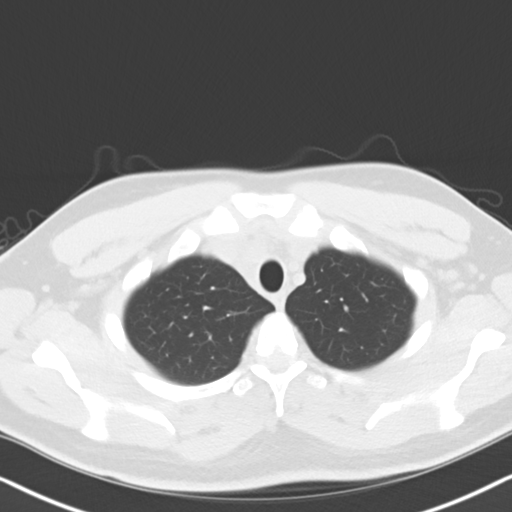
[im 58/63  lung]
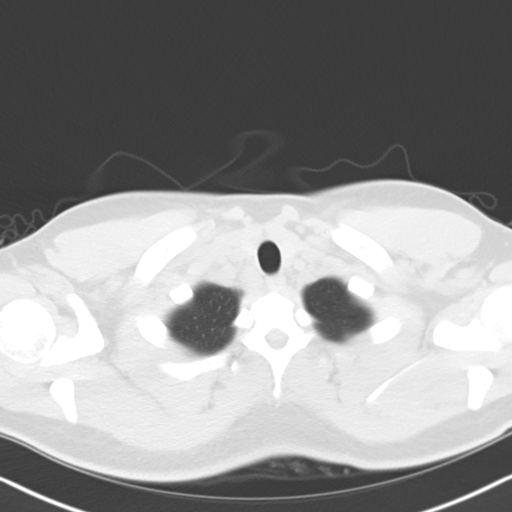

[Series 5: coronal · coronal · 0.61mm/px · 3 of 118 slices shown]
[im 24/118  lung]
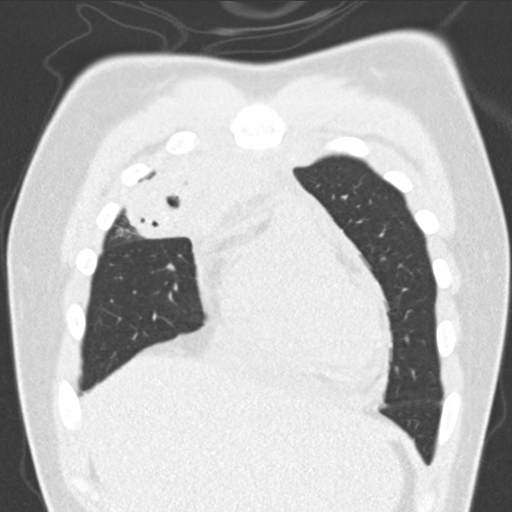
[im 47/118  lung]
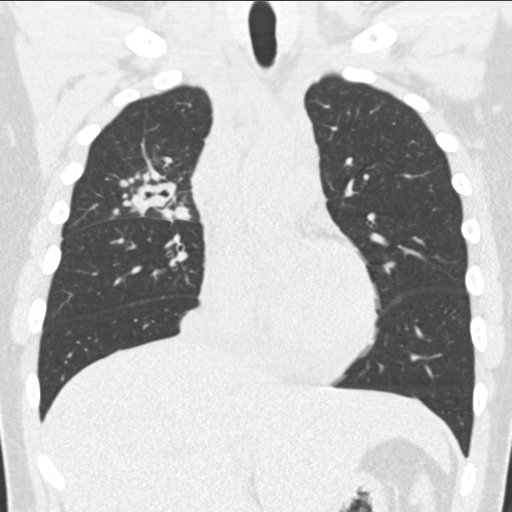
[im 71/118  lung]
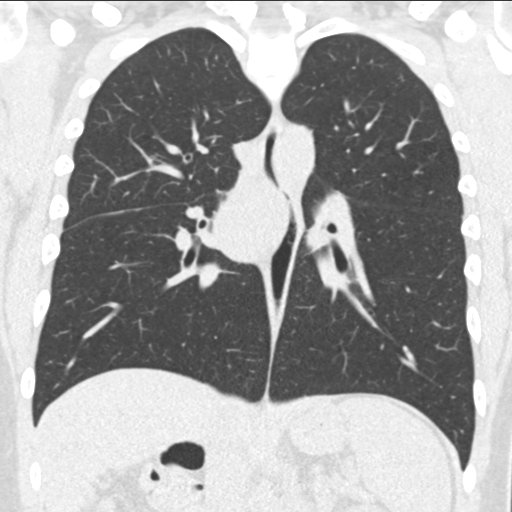

[15 of 36 positions shown; findings below may reference images not displayed]

FINDINGS: Mediastinum / Lymph Nodes: The large anteromedial right upper lobe
and mediastinal mass is again noted. The dominant lesion which
appears to invade anterior mediastinum measures 5.6 x 3.6 cm today
compared to 5.2 x 4.2 cm previously adjacent thick-walled cavitary
lesions in the medial right upper lobe persists. In the interval
since the prior study, the patient has developed substantial
mediastinal lymphadenopathy. 2.3 cm short axis prevascular lymph
node is new in the interval. 3.4 cm short axis subcarinal lymph node
was 1.2 cm previously. 1.9 cm right paratracheal lymph node was
cm previously.

Lungs / Pleura: Cavitary lung lesions with satellite nodules in the
anterior right upper lobe persists without substantial interval
change. These are quite thick walled with the wall in some of these
cavitary areas measuring up to 9-10 mm. Right lower lobe and left
lung are clear. No pleural effusion. Trace pericardial effusion is
new in the interval.

Upper Abdomen:  Unremarkable.

[HOSPITAL] / Soft Tissues: Bone windows reveal no worrisome lytic or
sclerotic osseous lesions.
IMPRESSION: No substantial interval change in the anteromedial right upper lobe
cavitary mass-like lesion which appears to involve the mediastinum
anteriorly. However, has been interval development of bulky
prevascular and subcarinal lymphadenopathy. The degree of lymph node
progression in 5 weeks and lack of any discernible
improvement/clearing is concerning. As such, neoplastic etiology,
such as lymphoma, becomes end of higher concern.

I discussed the results of this study with Dr. Dale, who was
covering for Dr. Bina, on the afternoon of 02/28/2014.

## 2016-05-04 ENCOUNTER — Telehealth: Payer: Self-pay | Admitting: Medical Oncology

## 2016-05-04 NOTE — Telephone Encounter (Signed)
Asking if he needs port flushed. It has been at least 6 months. He was  No show for PET and f/u in nov.

## 2016-05-04 NOTE — Telephone Encounter (Signed)
He needs follow up appointment first.

## 2016-05-09 ENCOUNTER — Telehealth: Payer: Self-pay | Admitting: Internal Medicine

## 2016-05-09 NOTE — Telephone Encounter (Signed)
Called both numbers - one number was not working and other number had a voicemail was full. Scheduled appt per sch message from [facility] 5/7 - sent reminder letter in the mail with appt date and time.

## 2016-05-25 ENCOUNTER — Ambulatory Visit: Payer: Medicaid Other | Admitting: Internal Medicine

## 2016-05-25 ENCOUNTER — Telehealth: Payer: Self-pay | Admitting: Internal Medicine

## 2016-05-25 ENCOUNTER — Telehealth: Payer: Self-pay | Admitting: *Deleted

## 2016-05-25 ENCOUNTER — Other Ambulatory Visit: Payer: Medicaid Other

## 2016-05-25 NOTE — Telephone Encounter (Signed)
Scheduled appt per sch message from [place] 5/24 - unable to reach patient sent letter in the mail.

## 2016-05-25 NOTE — Telephone Encounter (Signed)
"  Reschedule today's appointment to early tomorrow morning.  I woke up at 8:50 am, the earliest I could get there is 9:50 am.  Also place me on the add on list if a patient cancels.  Return number [phone]."  Message left for collaborative nurse.  Patient expecting return call with appointment plan.  Notified him first available at this time is 06-26-2016 at 9:15 am.  Will need lab at 8:30, flush at 8:45.

## 2016-05-26 ENCOUNTER — Ambulatory Visit (INDEPENDENT_AMBULATORY_CARE_PROVIDER_SITE_OTHER): Payer: Self-pay | Admitting: Interventional Cardiology

## 2016-05-26 ENCOUNTER — Encounter: Payer: Self-pay | Admitting: Interventional Cardiology

## 2016-05-26 VITALS — BP 124/84 | HR 85 | Ht 69.0 in | Wt 210.8 lb

## 2016-05-26 DIAGNOSIS — I313 Pericardial effusion (noninflammatory): Secondary | ICD-10-CM

## 2016-05-26 DIAGNOSIS — I3139 Other pericardial effusion (noninflammatory): Secondary | ICD-10-CM

## 2016-05-26 DIAGNOSIS — C8112 Nodular sclerosis classical Hodgkin lymphoma, intrathoracic lymph nodes: Secondary | ICD-10-CM

## 2016-05-26 NOTE — Patient Instructions (Signed)
Medication Instructions:  None  Labwork: None  Testing/Procedures: Your physician has requested that you have an echocardiogram. Echocardiography is a painless test that uses sound waves to create images of your heart. It provides your doctor with information about the size and shape of your heart and how well your heart's chambers and valves are working. This procedure takes approximately one hour. There are no restrictions for this procedure.   Follow-Up: Your physician recommends that you schedule a follow-up appointment as needed with Dr. Smith.   Any Other Special Instructions Will Be Listed Below (If Applicable).     If you need a refill on your cardiac medications before your next appointment, please call your pharmacy.   

## 2016-05-26 NOTE — Progress Notes (Signed)
Cardiology Office Note    Date:  05/26/2016   ID:  Antrone, [place] 19-Apr-1995, MRN [id number]  PCP:  [facility]  Cardiologist: Sinclair Grooms, MD   Chief Complaint  Patient presents with  . Follow-up    Pericardial effusion related to Hodgkin's    History of Present Illness:  Jeffery Crane is a 21 y.o. male with history of nodular sclerosing Hodgkin's lymphoma, with dramatic improvement on chemotherapy, initial presentation complicated by pericardial involvement with large effusion and tamponade, and history of ADHD.  When last seen, Jeffery Crane was starting chemotherapy. After looking at his records, it appears that he has undergone chemotherapy with a dramatic response. He feels that he may be disease free. He has not seen medical oncology within the past 4-6 months. He now questions whether or not his port can be discontinued.  He denies orthopnea, PND, palpitations, leg edema, and syncope. He is back at work. His appetite is improved. And he has gained weight back to his typical baseline. When last seen he was relatively cachectic appearing. Since I last saw him he has gained 26 pounds.  Past Medical History:  Diagnosis Date  . Adult ADHD   . Anemia   . Chemotherapy induced neutropenia ([place]) 06/04/2015  . Encounter for antineoplastic chemotherapy 05/04/2015  . GERD (gastroesophageal reflux disease)   . Headache   . 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 Pneumonia     Past Surgical History:  Procedure Laterality Date  . CARDIAC CATHETERIZATION N/A 04/01/2015   Procedure: Pericardiocentesis;  Surgeon: Sherren Mocha, MD;  Location: [facility];  Service: Cardiovascular;  Laterality: N/A;  . SCALENE NODE BIOPSY Right 03/15/2015   Procedure: BIOPSY SCALENE NODE;  Surgeon: [name], MD;  Location: [facility];  Service: Thoracic;  Laterality: Right;  . SUBXYPHOID PERICARDIAL WINDOW  04/29/2015    . SUBXYPHOID PERICARDIAL WINDOW N/A 04/29/2015   Procedure: SUBXYPHOID PERICARDIAL WINDOW;  Surgeon: [name], MD;  Location: [facility];  Service: Thoracic;  Laterality: N/A;  . SUPRACLAVICAL NODE BIOPSY Right 03/15/2015   Procedure: RIGHT SUPRACLAVICAL/CERVICAL LYMPH NODE BIOPSY;  Surgeon: [name], MD;  Location: [facility];  Service: Thoracic;  Laterality: Right;  . TEE WITHOUT CARDIOVERSION N/A 04/29/2015   Procedure: TRANSESOPHAGEAL ECHOCARDIOGRAM (TEE);  Surgeon: [name], MD;  Location: [facility];  Service: Thoracic;  Laterality: N/A;  . VIDEO BRONCHOSCOPY Bilateral 01/22/2015   Procedure: VIDEO BRONCHOSCOPY WITH FLUORO;  Surgeon: Marshell Garfinkel, MD;  Location: [facility];  Service: Cardiopulmonary;  Laterality: Bilateral;    Current Medications: Outpatient Medications Prior to Visit  Medication Sig Dispense Refill  . allopurinol (ZYLOPRIM) 100 MG tablet Take 1 tablet (100 mg total) by mouth 2 (two) times daily. (Patient not taking: Reported on 10/26/2015) 60 tablet 3  . gentamicin (GARAMYCIN) 0.3 % ophthalmic solution Place 2 drops into the right eye 4 (four) times daily. (Patient not taking: Reported on 05/26/2016) 5 mL 0  . lidocaine-prilocaine (EMLA) cream Apply 1 application topically as needed. (Patient not taking: Reported on 10/26/2015) 30 g 0  . ondansetron (ZOFRAN) 8 MG tablet Take 1 tablet (8 mg total) by mouth every 8 (eight) hours as needed for nausea or vomiting (Start on day 4 after chemotherapy and take only as needed if compazine not effecctive). (Patient not taking: Reported on 10/26/2015) 20 tablet 0  . oxyCODONE (OXY IR/ROXICODONE) 5 MG immediate release tablet Take  1-2 tablets (5-10 mg total) by mouth every 4 (four) hours as needed for severe pain. (Patient not taking: Reported on 10/26/2015) 30 tablet 0   Facility-Administered Medications Prior to Visit  Medication Dose Route Frequency Provider Last Rate Last Dose  . sodium chloride flush (NS)  0.9 % injection 10 mL  10 mL Intracatheter PRN Curt Bears, MD   10 mL at 09/28/15 1654     Allergies:   Amoxicillin-pot clavulanate and Bactrim   Social History   Social History  . Marital status: Single    Spouse name: N/A  . Number of children: N/A  . Years of education: N/A   Social History Main Topics  . Smoking status: Former Smoker    Quit date: 09/16/2015  . Smokeless tobacco: Never Used  . Alcohol use Yes     Comment: occasionally  . Drug use: No  . Sexual activity: Yes   Other Topics Concern  . None   Social History Narrative  . None     Family History:  The patient's family history includes Heart disease in his maternal grandfather; Lung disease in his maternal grandfather; Narcolepsy in his mother.   ROS:   Please see the history of present illness.    He voices no complaints other than irritation at the device site.  All other systems reviewed and are negative.   PHYSICAL EXAM:   VS:  BP 124/84 (BP Location: Left Arm)   Pulse 85   Ht 5\' 9"  (1.753 m)   Wt 210 lb 12.8 oz (95.6 kg)   BMI 31.13 kg/m    GEN: Well nourished, well developed, in no acute distress  HEENT: normal  Neck: no JVD, carotid bruits, or masses Cardiac: RRR; no murmurs, rubs, or gallops,no edema  Respiratory:  clear to auscultation bilaterally, normal work of breathing GI: soft, nontender, nondistended, + BS MS: no deformity or atrophy  Skin: warm and dry, no rash Neuro:  Alert and Oriented x 3, Strength and sensation are intact Psych: euthymic mood, full affect  Wt Readings from Last 3 Encounters:  05/26/16 210 lb 12.8 oz (95.6 kg)  10/26/15 190 lb 9.6 oz (86.5 kg)  10/16/15 184 lb (83.5 kg)      Studies/Labs Reviewed:   EKG:  EKG  Normal sinus rhythm, vertical axis, otherwise normal. When compared to prior tracings.  Recent Labs: 10/26/2015: ALT 17; BUN 8.5; Creatinine 0.8; HGB 13.2; Platelets 375; Potassium 3.9; Sodium 142   Lipid Panel No results found for:  CHOL, TRIG, HDL, CHOLHDL, VLDL, LDLCALC, LDLDIRECT  Additional studies/ records that were reviewed today include:  No new data    ASSESSMENT:    1. 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 Pericardial effusion      PLAN:  In order of problems listed above:  1. Apparent dramatic/curative response to chemotherapy. He will need to discuss removal of his port with ocular monomer,. 2. There is no clinical evidence of recurrent pericardial disease/effusion. He underwent a pericardiectomy by Dr. Roxan Hockey in 2017.  A 2-D Doppler echocardiogram will be performed to document LV size and function after chemotherapy and reassess the pericardial space. Anticipate normal findings. He will have cardiology follow up when necessary less surprises are noted on echocardiography.  Medication Adjustments/Labs and Tests Ordered: Current medicines are reviewed at length with the patient today.  Concerns regarding medicines are outlined above.  Medication changes, Labs and Tests ordered today are listed in the Patient Instructions below.  Patient Instructions  Medication Instructions:  None  Labwork: None  Testing/Procedures: Your physician has requested that you have an echocardiogram. Echocardiography is a painless test that uses sound waves to create images of your heart. It provides your doctor with information about the size and shape of your heart and how well your heart's chambers and valves are working. This procedure takes approximately one hour. There are no restrictions for this procedure.   Follow-Up: Your physician recommends that you schedule a follow-up appointment as needed with Dr. Tamala Julian.    Any Other Special Instructions Will Be Listed Below (If Applicable).     If you need a refill on your cardiac medications before your next appointment, please call your pharmacy.      Signed, Sinclair Grooms, MD  05/26/2016 10:54 AM    [facility] [address], [place], [place]  [address] Phone: [phone]; Fax: [phone]

## 2016-06-06 ENCOUNTER — Ambulatory Visit (HOSPITAL_COMMUNITY): Payer: Self-pay | Attending: Cardiovascular Disease

## 2016-06-06 ENCOUNTER — Other Ambulatory Visit: Payer: Self-pay

## 2016-06-06 DIAGNOSIS — I313 Pericardial effusion (noninflammatory): Secondary | ICD-10-CM | POA: Insufficient documentation

## 2016-06-06 DIAGNOSIS — I517 Cardiomegaly: Secondary | ICD-10-CM | POA: Insufficient documentation

## 2016-06-06 DIAGNOSIS — I3139 Other pericardial effusion (noninflammatory): Secondary | ICD-10-CM

## 2016-06-15 ENCOUNTER — Ambulatory Visit (HOSPITAL_BASED_OUTPATIENT_CLINIC_OR_DEPARTMENT_OTHER): Payer: Self-pay

## 2016-06-15 ENCOUNTER — Telehealth: Payer: Self-pay | Admitting: Internal Medicine

## 2016-06-15 ENCOUNTER — Other Ambulatory Visit (HOSPITAL_BASED_OUTPATIENT_CLINIC_OR_DEPARTMENT_OTHER): Payer: Self-pay

## 2016-06-15 ENCOUNTER — Encounter: Payer: Self-pay | Admitting: Internal Medicine

## 2016-06-15 ENCOUNTER — Ambulatory Visit (HOSPITAL_BASED_OUTPATIENT_CLINIC_OR_DEPARTMENT_OTHER): Payer: Self-pay | Admitting: Internal Medicine

## 2016-06-15 VITALS — BP 125/93 | HR 70 | Temp 98.2°F | Resp 18 | Ht 69.0 in | Wt 210.0 lb

## 2016-06-15 DIAGNOSIS — Z95828 Presence of other vascular implants and grafts: Secondary | ICD-10-CM

## 2016-06-15 DIAGNOSIS — C8118 Nodular sclerosis classical Hodgkin lymphoma, lymph nodes of multiple sites: Secondary | ICD-10-CM

## 2016-06-15 DIAGNOSIS — C8112 Nodular sclerosis classical Hodgkin lymphoma, intrathoracic lymph nodes: Secondary | ICD-10-CM

## 2016-06-15 DIAGNOSIS — T451X5A Adverse effect of antineoplastic and immunosuppressive drugs, initial encounter: Secondary | ICD-10-CM

## 2016-06-15 DIAGNOSIS — D701 Agranulocytosis secondary to cancer chemotherapy: Secondary | ICD-10-CM

## 2016-06-15 LAB — COMPREHENSIVE METABOLIC PANEL
ALT: 17 U/L (ref 0–55)
ANION GAP: 10 meq/L (ref 3–11)
AST: 18 U/L (ref 5–34)
Albumin: 3.8 g/dL (ref 3.5–5.0)
Alkaline Phosphatase: 106 U/L (ref 40–150)
BILIRUBIN TOTAL: 0.63 mg/dL (ref 0.20–1.20)
BUN: 8.9 mg/dL (ref 7.0–26.0)
CALCIUM: 9.4 mg/dL (ref 8.4–10.4)
CHLORIDE: 107 meq/L (ref 98–109)
CO2: 26 mEq/L (ref 22–29)
CREATININE: 0.9 mg/dL (ref 0.7–1.3)
Glucose: 95 mg/dl (ref 70–140)
Potassium: 4.1 mEq/L (ref 3.5–5.1)
Sodium: 143 mEq/L (ref 136–145)
TOTAL PROTEIN: 7.3 g/dL (ref 6.4–8.3)

## 2016-06-15 LAB — CBC WITH DIFFERENTIAL/PLATELET
BASO%: 0.7 % (ref 0.0–2.0)
Basophils Absolute: 0 10*3/uL (ref 0.0–0.1)
EOS ABS: 0.1 10*3/uL (ref 0.0–0.5)
EOS%: 1.3 % (ref 0.0–7.0)
HEMATOCRIT: 44.1 % (ref 38.4–49.9)
HGB: 15.2 g/dL (ref 13.0–17.1)
LYMPH#: 2.3 10*3/uL (ref 0.9–3.3)
LYMPH%: 51.2 % — ABNORMAL HIGH (ref 14.0–49.0)
MCH: 30.2 pg (ref 27.2–33.4)
MCHC: 34.5 g/dL (ref 32.0–36.0)
MCV: 87.7 fL (ref 79.3–98.0)
MONO#: 0.4 10*3/uL (ref 0.1–0.9)
MONO%: 9.2 % (ref 0.0–14.0)
NEUT#: 1.7 10*3/uL (ref 1.5–6.5)
NEUT%: 37.6 % — AB (ref 39.0–75.0)
PLATELETS: 243 10*3/uL (ref 140–400)
RBC: 5.03 10*6/uL (ref 4.20–5.82)
RDW: 12.8 % (ref 11.0–14.6)
WBC: 4.5 10*3/uL (ref 4.0–10.3)

## 2016-06-15 MED ORDER — SODIUM CHLORIDE 0.9% FLUSH
10.0000 mL | Freq: Once | INTRAVENOUS | Status: AC
  Administered 2016-06-15: 10 mL
  Filled 2016-06-15: qty 10

## 2016-06-15 MED ORDER — HEPARIN SOD (PORK) LOCK FLUSH 100 UNIT/ML IV SOLN
500.0000 [IU] | Freq: Once | INTRAVENOUS | Status: AC
  Administered 2016-06-15: 500 [IU]
  Filled 2016-06-15: qty 5

## 2016-06-15 NOTE — Progress Notes (Signed)
[place] [phone]   Fax:(336) [place], [facility] [place] [address]  DIAGNOSIS: Stage III nodular sclerosing Hodgkin lymphoma presented with cavitary masses in the right lung in addition to mediastinal, supraclavicular, axillary as well as splenic involvement diagnosed in March 2017  PRIOR THERAPY:  1) Status post pericardiocentesis with drainage of pericardial fluid secondary to Hodgkin's lymphoma. Cytology was negative for lymphoma. 2) status post subxiphoid pericardial window under the care of Dr. Roxan Hockey on 04/29/2015  CURRENT THERAPY: Systemic chemotherapy with ABVD. First dose 04/20/2015. Status post 6 cycles.   INTERVAL HISTORY: Jeffery Crane 21 y.o. male returns to the clinic today for follow-up visit. The patient was last seen in October 2017. He was lost to follow-up since that time. He did not have any restaging scans after his last dose of the chemotherapy. He mentioned that he was busy taking care of other issues. He did not even have Port-A-Cath flush for the last 8 months. He is feeling fine today. He gained a lot of weight. He denied having any chest pain, shortness of breath, cough or hemoptysis. He has no nausea, vomiting, diarrhea or constipation. He is here today for evaluation and repeat blood work. He also would like the Port-A-Cath to be removed.  MEDICAL HISTORY: Past Medical History:  Diagnosis Date  . Adult ADHD   . Anemia   . Chemotherapy induced neutropenia ([place]) 06/04/2015  . Encounter for antineoplastic chemotherapy 05/04/2015  . GERD (gastroesophageal reflux disease)   . Headache   . 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 Pneumonia     ALLERGIES:  is allergic to amoxicillin-pot clavulanate and bactrim.  MEDICATIONS:  Current Outpatient Prescriptions  Medication Sig Dispense Refill  .  lidocaine-prilocaine (EMLA) cream Apply 1 application topically as needed. (Patient not taking: Reported on 10/26/2015) 30 g 0   No current facility-administered medications for this visit.    Facility-Administered Medications Ordered in Other Visits  Medication Dose Route Frequency Provider Last Rate Last Dose  . sodium chloride flush (NS) 0.9 % injection 10 mL  10 mL Intracatheter PRN Curt Bears, MD   10 mL at 09/28/15 1654    SURGICAL HISTORY:  Past Surgical History:  Procedure Laterality Date  . CARDIAC CATHETERIZATION N/A 04/01/2015   Procedure: Pericardiocentesis;  Surgeon: Sherren Mocha, MD;  Location: [facility];  Service: Cardiovascular;  Laterality: N/A;  . SCALENE NODE BIOPSY Right 03/15/2015   Procedure: BIOPSY SCALENE NODE;  Surgeon: [name], MD;  Location: [facility];  Service: Thoracic;  Laterality: Right;  . SUBXYPHOID PERICARDIAL WINDOW  04/29/2015  . SUBXYPHOID PERICARDIAL WINDOW N/A 04/29/2015   Procedure: SUBXYPHOID PERICARDIAL WINDOW;  Surgeon: [name], MD;  Location: [facility];  Service: Thoracic;  Laterality: N/A;  . SUPRACLAVICAL NODE BIOPSY Right 03/15/2015   Procedure: RIGHT SUPRACLAVICAL/CERVICAL LYMPH NODE BIOPSY;  Surgeon: [name], MD;  Location: [facility];  Service: Thoracic;  Laterality: Right;  . TEE WITHOUT CARDIOVERSION N/A 04/29/2015   Procedure: TRANSESOPHAGEAL ECHOCARDIOGRAM (TEE);  Surgeon: [name], MD;  Location: [facility];  Service: Thoracic;  Laterality: N/A;  . VIDEO BRONCHOSCOPY Bilateral 01/22/2015   Procedure: VIDEO BRONCHOSCOPY WITH FLUORO;  Surgeon: Marshell Garfinkel, MD;  Location: [facility];  Service: Cardiopulmonary;  Laterality: Bilateral;    REVIEW OF SYSTEMS:  A comprehensive review of systems was negative.  PHYSICAL EXAMINATION: General appearance: alert, cooperative and no distress Head: Normocephalic, without obvious abnormality, atraumatic Neck: marked anterior cervical adenopathy and  thyroid not enlarged, symmetric, no tenderness/mass/nodules Lymph nodes: Cervical, supraclavicular, and axillary nodes normal. Resp: clear to auscultation bilaterally Back: symmetric, no curvature. ROM normal. No CVA tenderness. Cardio: regular rate and rhythm, S1, S2 normal, no murmur, click, rub or gallop GI: soft, non-tender; bowel sounds normal; no masses,  no organomegaly Extremities: extremities normal, atraumatic, no cyanosis or edema   ECOG PERFORMANCE STATUS: 1 - Symptomatic but completely ambulatory  Blood pressure (!) 125/93, pulse 70, temperature 98.2 F (36.8 C), temperature source Oral, resp. rate 18, height 5\' 9"  (1.753 m), weight 210 lb (95.3 kg), SpO2 99 %.  LABORATORY DATA: Lab Results  Component Value Date   WBC 4.5 06/15/2016   HGB 15.2 06/15/2016   HCT 44.1 06/15/2016   MCV 87.7 06/15/2016   PLT 243 06/15/2016      Chemistry      Component Value Date/Time   NA 142 10/26/2015 1159   K 3.9 10/26/2015 1159   CL 103 10/16/2015 2006   CL 105 12/18/2013 0848   CO2 26 10/26/2015 1159   BUN 8.5 10/26/2015 1159   CREATININE 0.8 10/26/2015 1159      Component Value Date/Time   CALCIUM 8.8 10/26/2015 1159   ALKPHOS 112 10/26/2015 1159   AST 20 10/26/2015 1159   ALT 17 10/26/2015 1159   BILITOT <0.22 10/26/2015 1159       RADIOGRAPHIC STUDIES: No results found.  ASSESSMENT AND PLAN:  This is a very pleasant 21 years old African-American male with a stage III nodular sclerosing Hodgkin lymphoma with bulky lymphadenopathy in the right lower cervical, right supraclavicular, right axillary as well as mediastinal lymph nodes in addition to cavitary lung masses and splenic involvement diagnosed in March 2017. He status post 6 cycles of systemic chemotherapy with ABVD. The patient was lost to follow-up after the last cycle of his treatment. I had a lengthy discussion with him today about his condition and the need for follow-up visit and evaluation. I recommended  for the patient to have repeat PET scan for the evaluation of his disease. If the PET scan showed no evidence for residual disease, I would consider removing the Port-A-Cath based on his request. I will see him back for follow-up visit in 6 months for reevaluation with repeat blood work. He was advised to call immediately if he has any concerning symptoms in the interval. The patient voices understanding of current disease status and treatment options and is in agreement with the current care plan.  All questions were answered. The patient knows to call the clinic with any problems, questions or concerns. We can certainly see the patient much sooner if necessary.  Disclaimer: This note was dictated with voice recognition software. Similar sounding words can inadvertently be transcribed and may not be corrected upon review.

## 2016-06-15 NOTE — Telephone Encounter (Signed)
Appointment scheduled per 06/15/16 los.  Patient was given a copy of the AVS report and appointment schedule, per 06/15/16 los.

## 2016-07-03 ENCOUNTER — Ambulatory Visit (HOSPITAL_COMMUNITY): Payer: Self-pay

## 2016-07-18 ENCOUNTER — Ambulatory Visit (HOSPITAL_COMMUNITY)
Admission: RE | Admit: 2016-07-18 | Discharge: 2016-07-18 | Disposition: A | Discharge status: Home / Self Care | Payer: Self-pay | Source: Ambulatory Visit | Attending: Internal Medicine | Admitting: Internal Medicine

## 2016-07-18 DIAGNOSIS — C8112 Nodular sclerosis classical Hodgkin lymphoma, intrathoracic lymph nodes: Secondary | ICD-10-CM | POA: Insufficient documentation

## 2016-07-18 LAB — GLUCOSE, CAPILLARY: Glucose-Capillary: 87 mg/dL (ref 65–99)

## 2016-07-18 MED ORDER — FLUDEOXYGLUCOSE F - 18 (FDG) INJECTION
11.7000 | Freq: Once | INTRAVENOUS | Status: AC | PRN
  Administered 2016-07-18: 11.7 via INTRAVENOUS

## 2016-08-15 ENCOUNTER — Ambulatory Visit (HOSPITAL_BASED_OUTPATIENT_CLINIC_OR_DEPARTMENT_OTHER): Payer: Self-pay

## 2016-08-15 ENCOUNTER — Telehealth: Payer: Self-pay | Admitting: Internal Medicine

## 2016-08-15 DIAGNOSIS — D701 Agranulocytosis secondary to cancer chemotherapy: Secondary | ICD-10-CM

## 2016-08-15 DIAGNOSIS — Z95828 Presence of other vascular implants and grafts: Secondary | ICD-10-CM

## 2016-08-15 DIAGNOSIS — T451X5A Adverse effect of antineoplastic and immunosuppressive drugs, initial encounter: Secondary | ICD-10-CM

## 2016-08-15 MED ORDER — HEPARIN SOD (PORK) LOCK FLUSH 100 UNIT/ML IV SOLN
500.0000 [IU] | Freq: Once | INTRAVENOUS | Status: AC
Start: 2016-08-15 — End: 2016-08-15
  Administered 2016-08-15: 500 [IU]
  Filled 2016-08-15: qty 5

## 2016-08-15 MED ORDER — SODIUM CHLORIDE 0.9% FLUSH
10.0000 mL | Freq: Once | INTRAVENOUS | Status: AC
  Administered 2016-08-15: 10 mL
  Filled 2016-08-15: qty 10

## 2016-08-15 NOTE — Telephone Encounter (Signed)
Spoke with patient regarding his appt on 8/30.

## 2016-08-31 ENCOUNTER — Telehealth: Payer: Self-pay | Admitting: Internal Medicine

## 2016-08-31 ENCOUNTER — Encounter: Payer: Self-pay | Admitting: Internal Medicine

## 2016-08-31 ENCOUNTER — Encounter: Payer: Self-pay | Admitting: Radiation Oncology

## 2016-08-31 ENCOUNTER — Ambulatory Visit (HOSPITAL_BASED_OUTPATIENT_CLINIC_OR_DEPARTMENT_OTHER): Payer: Self-pay | Admitting: Internal Medicine

## 2016-08-31 VITALS — BP 133/91 | HR 70 | Temp 98.4°F | Resp 18 | Wt 211.0 lb

## 2016-08-31 DIAGNOSIS — C8118 Nodular sclerosis classical Hodgkin lymphoma, lymph nodes of multiple sites: Secondary | ICD-10-CM

## 2016-08-31 DIAGNOSIS — C8112 Nodular sclerosis classical Hodgkin lymphoma, intrathoracic lymph nodes: Secondary | ICD-10-CM

## 2016-08-31 NOTE — Telephone Encounter (Signed)
Scheduled patients appointments. Did not want avs or calendar.

## 2016-08-31 NOTE — Progress Notes (Signed)
[facility] Crane Telephone:([phone]   Fax:(336) [place], [facility] [place] [address]  DIAGNOSIS: Stage III nodular sclerosing Hodgkin lymphoma presented with cavitary masses in the right lung in addition to mediastinal, supraclavicular, axillary as well as splenic involvement diagnosed in March 2017  PRIOR THERAPY:  1) Status post pericardiocentesis with drainage of pericardial fluid secondary to Hodgkin's lymphoma. Cytology was negative for lymphoma. 2) status post subxiphoid pericardial window under the care of Dr. Roxan Hockey on 04/29/2015. 3) Systemic chemotherapy with ABVD. First dose 04/20/2015. Status post 6 cycles.   CURRENT THERAPY: Observation.  INTERVAL HISTORY: Jeffery Crane 21 y.o. male returns to the clinic today for follow-up visit. The patient is feeling fine today with no specific complaints. He denied having any chest pain, shortness breath, cough or hemoptysis. He denied having any recent weight loss or night sweats. He has no nausea, vomiting, diarrhea or constipation. He has no fever or chills. He denied having any palpable lymphadenopathy or bleeding issues. He had a PET scan performed last months and he is here today for evaluation and discussion of his scan results and recommendation regarding treatment of his condition.   MEDICAL HISTORY: Past Medical History:  Diagnosis Date  . Adult ADHD   . Anemia   . Chemotherapy induced neutropenia ([place]) 06/04/2015  . Encounter for antineoplastic chemotherapy 05/04/2015  . GERD (gastroesophageal reflux disease)   . Headache   . 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 Pneumonia     ALLERGIES:  is allergic to amoxicillin-pot clavulanate and bactrim.  MEDICATIONS:  No current outpatient prescriptions on file.   No current facility-administered medications for this visit.     Facility-Administered Medications Ordered in Other Visits  Medication Dose Route Frequency Provider Last Rate Last Dose  . sodium chloride flush (NS) 0.9 % injection 10 mL  10 mL Intracatheter PRN Curt Bears, MD   10 mL at 09/28/15 1654    SURGICAL HISTORY:  Past Surgical History:  Procedure Laterality Date  . CARDIAC CATHETERIZATION N/A 04/01/2015   Procedure: Pericardiocentesis;  Surgeon: Sherren Mocha, MD;  Location: [facility];  Service: Cardiovascular;  Laterality: N/A;  . SCALENE NODE BIOPSY Right 03/15/2015   Procedure: BIOPSY SCALENE NODE;  Surgeon: [name], MD;  Location: [place] [facility];  Service: Thoracic;  Laterality: Right;  . SUBXYPHOID PERICARDIAL WINDOW  04/29/2015  . SUBXYPHOID PERICARDIAL WINDOW N/A 04/29/2015   Procedure: SUBXYPHOID PERICARDIAL WINDOW;  Surgeon: [name], MD;  Location: [place];  Service: Thoracic;  Laterality: N/A;  . SUPRACLAVICAL NODE BIOPSY Right 03/15/2015   Procedure: RIGHT SUPRACLAVICAL/CERVICAL LYMPH NODE BIOPSY;  Surgeon: [name], MD;  Location: [facility];  Service: Thoracic;  Laterality: Right;  . TEE WITHOUT CARDIOVERSION N/A 04/29/2015   Procedure: TRANSESOPHAGEAL ECHOCARDIOGRAM (TEE);  Surgeon: [name], MD;  Location: [facility];  Service: Thoracic;  Laterality: N/A;  . VIDEO BRONCHOSCOPY Bilateral 01/22/2015   Procedure: VIDEO BRONCHOSCOPY WITH FLUORO;  Surgeon: Marshell Garfinkel, MD;  Location: [facility];  Service: Cardiopulmonary;  Laterality: Bilateral;    REVIEW OF SYSTEMS:  Constitutional: negative Eyes: negative Ears, nose, mouth, throat, and face: negative Respiratory: negative Cardiovascular: negative Gastrointestinal: negative Genitourinary:negative Integument/breast: negative Hematologic/lymphatic: negative Musculoskeletal:negative Neurological: negative Behavioral/Psych: negative Endocrine: negative Allergic/Immunologic: negative   PHYSICAL EXAMINATION: General appearance:  alert, cooperative and no distress Head: Normocephalic, without obvious abnormality, atraumatic  Neck: marked anterior cervical adenopathy and thyroid not enlarged, symmetric, no tenderness/mass/nodules Lymph nodes: Cervical, supraclavicular, and axillary nodes normal. Resp: clear to auscultation bilaterally Back: symmetric, no curvature. ROM normal. No CVA tenderness. Cardio: regular rate and rhythm, S1, S2 normal, no murmur, click, rub or gallop GI: soft, non-tender; bowel sounds normal; no masses,  no organomegaly Extremities: extremities normal, atraumatic, no cyanosis or edema Neurologic: Alert and oriented X 3, normal strength and tone. Normal symmetric reflexes. Normal coordination and gait   ECOG PERFORMANCE STATUS: 1 - Symptomatic but completely ambulatory  Blood pressure (!) 133/91, pulse 70, temperature 98.4 F (36.9 C), temperature source Oral, resp. rate 18, weight 211 lb (95.7 kg), SpO2 100 %.  LABORATORY DATA: Lab Results  Component Value Date   WBC 4.5 06/15/2016   HGB 15.2 06/15/2016   HCT 44.1 06/15/2016   MCV 87.7 06/15/2016   PLT 243 06/15/2016      Chemistry      Component Value Date/Time   NA 143 06/15/2016 1124   K 4.1 06/15/2016 1124   CL 103 10/16/2015 2006   CL 105 12/18/2013 0848   CO2 26 06/15/2016 1124   BUN 8.9 06/15/2016 1124   CREATININE 0.9 06/15/2016 1124      Component Value Date/Time   CALCIUM 9.4 06/15/2016 1124   ALKPHOS 106 06/15/2016 1124   AST 18 06/15/2016 1124   ALT 17 06/15/2016 1124   BILITOT 0.63 06/15/2016 1124       RADIOGRAPHIC STUDIES: Nm Pet Image Restag (ps) Skull Base To Thigh  Result Date: 07/19/2016 CLINICAL DATA:  Subsequent treatment strategy for Hodgkin's lymphoma post chemotherapy. EXAM: NUCLEAR MEDICINE PET SKULL BASE TO THIGH TECHNIQUE: 11.7 mCi F-18 FDG was injected intravenously. Full-ring PET imaging was performed from the skull base to thigh after the radiotracer. CT data was obtained and used for  attenuation correction and anatomic localization. FASTING BLOOD GLUCOSE:  Value: 87 mg/dl COMPARISON:  PET-CT 03/31/2015 and 07/20/2015. FINDINGS: NECK No hypermetabolic cervical lymph nodes are identified.There are no lesions of the pharyngeal mucosal space. Symmetric metabolic activity within the lymphoid tissue of Waldeyer's ring is similar to the previous study. CHEST There is a new hypermetabolic left superior paratracheal lymph node, measuring 18 x 12 mm on image 52 (SUV max 8.0). The right anterior mediastinal/ prevascular mass is similar in size, measuring 4.0 x 3.2 cm on image 68 (previously 3.9 x 3.2 cm). However, it demonstrates increased metabolic activity with an SUV max of 8.0 (previously 4.2). There are no other hypermetabolic mediastinal, hilar or axillary lymph nodes. Brown fat activity noted previously has improved. There is no suspicious pulmonary activity. Small right middle lobe nodules are stable. Right IJ Port-A-Cath remains in place. ABDOMEN/PELVIS There is no hypermetabolic activity within the liver, adrenal glands, spleen or pancreas. There is no hypermetabolic nodal activity. SKELETON There is no hypermetabolic activity to suggest osseous metastatic disease. IMPRESSION: 1. Interval increased metabolic activity within the pre- vascular soft tissue mass which is unchanged in size. There is also new hypermetabolic activity within a left superior paratracheal lymph node. [place] 5. 2. No extra thoracic involvement identified. Electronically Signed   By: Richardean Sale M.D.   On: 07/19/2016 08:32   ASSESSMENT AND PLAN:  This is a very pleasant 21 years old African-American male with a stage III nodular sclerosing Hodgkin lymphoma with bulky lymphadenopathy in the right lower cervical, right supraclavicular, right axillary as well as mediastinal lymph nodes in addition to cavitary lung masses and splenic  involvement diagnosed in March 2017. He is status post 6 cycles of systemic  chemotherapy with ABVD. The patient has been on observation since her last dose of his treatment. He is a little bit noncompliant with his follow-up visit and evaluation. He had repeat PET scan performed last month but he missed the follow-up visit. He is here today for evaluation and discussion of his PET scan results and recommendation regarding his condition. I personally and independently reviewed the PET scan images and discuss the results and showed the images to the patient today. Unfortunately there are some evidence of disease recurrence involving the vascular soft tissue mass as well as new hypermetabolic left superior paratracheal lymph node. I recommended for the patient to see radiation oncology for consideration of curative radiotherapy to this disease recurrence. I will arrange for him to have repeat PET scan in 5 months for restaging of his disease after the radiotherapy. He was advised to call immediately if he has any concerning symptoms in the interval. The patient voices understanding of current disease status and treatment options and is in agreement with the current care plan.  All questions were answered. The patient knows to call the clinic with any problems, questions or concerns. We can certainly see the patient much sooner if necessary.  Disclaimer: This note was dictated with voice recognition software. Similar sounding words can inadvertently be transcribed and may not be corrected upon review.

## 2016-09-05 ENCOUNTER — Encounter: Payer: Self-pay | Admitting: Radiation Oncology

## 2016-09-05 NOTE — Progress Notes (Signed)
Thoracic Location of Tumor / Histology: Stage III nodular sclerosing Hodgkin lymphoma presented with cavitary masses in the right lung in addition to mediastinal, supraclavicular, axillary as well as splenic involvement diagnosed in March 2017  Patient had a PET on 07/18/16 which revealed some evidence of disease recurrence involving the vascular soft tissue mass as well as new hypermetabolic left superior paratracheal lymph node.    Tobacco/Marijuana/Snuff/ETOH use: former smoker/vaped/e cigarettes  Past/Anticipated interventions by cardiothoracic surgery, if any: pericardiocentesis  Past/Anticipated interventions by medical oncology, if any: PRIOR THERAPY:  1) Status post pericardiocentesis with drainage of pericardial fluid secondary to Hodgkin's lymphoma. Cytology was negative for lymphoma. 2) status post subxiphoid pericardial window under the care of Dr. Roxan Hockey on 04/29/2015. 3) Systemic chemotherapy with ABVD. First dose 04/20/2015. Status post 6 cycles.   CURRENT THERAPY: Observation.  Signs/Symptoms  Weight changes, if any: Denies  Respiratory complaints, if any: Denies  Hemoptysis, if any: Denies  Pain issues, if any:  Denies  SAFETY ISSUES:  Prior radiation? No  Pacemaker/ICD? no   Possible current pregnancy?no  Is the patient on methotrexate? no  Current Complaints / other details:  21 year old male. BP elevated. Reports his "Pops" (grandfather) recently died. He lives with his grandparents.

## 2016-09-07 ENCOUNTER — Encounter: Payer: Self-pay | Admitting: Radiation Oncology

## 2016-09-07 ENCOUNTER — Ambulatory Visit
Admission: RE | Admit: 2016-09-07 | Discharge: 2016-09-07 | Disposition: A | Discharge status: Home / Self Care | Payer: Self-pay | Source: Ambulatory Visit | Attending: Radiation Oncology | Admitting: Radiation Oncology

## 2016-09-07 VITALS — BP 145/104 | HR 62 | Temp 98.0°F | Resp 16 | Ht 70.0 in | Wt 214.0 lb

## 2016-09-07 DIAGNOSIS — F909 Attention-deficit hyperactivity disorder, unspecified type: Secondary | ICD-10-CM | POA: Insufficient documentation

## 2016-09-07 DIAGNOSIS — Z510 Encounter for antineoplastic radiation therapy: Secondary | ICD-10-CM | POA: Insufficient documentation

## 2016-09-07 DIAGNOSIS — Z87891 Personal history of nicotine dependence: Secondary | ICD-10-CM | POA: Insufficient documentation

## 2016-09-07 DIAGNOSIS — C8112 Nodular sclerosis classical Hodgkin lymphoma, intrathoracic lymph nodes: Secondary | ICD-10-CM

## 2016-09-07 DIAGNOSIS — K219 Gastro-esophageal reflux disease without esophagitis: Secondary | ICD-10-CM | POA: Insufficient documentation

## 2016-09-07 DIAGNOSIS — Z9221 Personal history of antineoplastic chemotherapy: Secondary | ICD-10-CM | POA: Insufficient documentation

## 2016-09-07 DIAGNOSIS — C8118 Nodular sclerosis classical Hodgkin lymphoma, lymph nodes of multiple sites: Secondary | ICD-10-CM | POA: Insufficient documentation

## 2016-09-07 DIAGNOSIS — C8110 Nodular sclerosis classical Hodgkin lymphoma, unspecified site: Secondary | ICD-10-CM

## 2016-09-07 DIAGNOSIS — Z820 Family history of epilepsy and other diseases of the nervous system: Secondary | ICD-10-CM | POA: Insufficient documentation

## 2016-09-07 HISTORY — DX: Nodular sclerosis Hodgkin lymphoma, unspecified site: C81.10

## 2016-09-07 NOTE — Progress Notes (Signed)
See progress note under physician encounter. 

## 2016-09-07 NOTE — Progress Notes (Signed)
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 male with a history of Stage III nodular sclerosing Hodgkin lymphoma seen at the request of Dr. Julien Nordmann. The patient presented with a cough and swollen lymph node in January 2017. PET scan on 03/31/15 showed hypermetabolic supraclavicular, right axillary, and mediastinal adenopathy consistent with lymphoma, hypermetabolic right upper lobe cavitary mass, hypermetabolic but normal size spleen, and intense metabolic activity throughout the entire marrow space consistent with lymphoma involvement of the skeleton. Pathology revealed Stage III nodular sclerosing Hodgkin lymphoma.  The patient is status post pericardiocentesis with drainage of pericardial fluid secondary to Hodgkin's lymphoma. Cytology was negative for lymphoma. He is also status post subxiphoid pericardial window under the care of Dr. Roxan Hockey on 04/28/16. He began systemic chemotherapy with ABVD on 04/20/15 and completed 6 of 6 cycles in 10/2015. He was then lost to follow up until 06/15/16.  He underwent PET scan on 07/19/16. This revealed interval increased metabolic activity (SUV max 8.0 as compared to 4.2 previously) within the right anterior mediastinal/pre-vascular soft tissue mass, measuring 4.0 x 3.2 cm, which is unchanged in size. There is also new hypermetabolic activity within a left superior paratracheal lymph node measuring 18 x 12 mm . There  are no other hypermetabolic mediastinal, hilar or axillary lymph nodes, no suspicious pulmonary activity and no extra thoracic involvement identified.  He presents today for consideration of curative radiotherapy to the disease recurrence involving the right anterior mediastinal/prevascular soft tissue mass as well as new hypermetabolic left superior paratracheal lymph node.  PREVIOUS RADIATION THERAPY: No  PAST MEDICAL HISTORY:  Past Medical History:  Diagnosis Date  . Adult ADHD   . Anemia   . Chemotherapy induced neutropenia ([place]) 06/04/2015  . Encounter for antineoplastic chemotherapy 05/04/2015  . GERD (gastroesophageal reflux disease)   . Headache   . Nodular sclerosing Hodgkin's lymphoma ([place])   . 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 Pneumonia       PAST SURGICAL HISTORY: Past Surgical History:  Procedure Laterality Date  . CARDIAC CATHETERIZATION N/A 04/01/2015   Procedure: Pericardiocentesis;  Surgeon: Sherren Mocha, MD;  Location: [facility];  Service: Cardiovascular;  Laterality: N/A;  . SCALENE NODE BIOPSY Right 03/15/2015   Procedure: BIOPSY SCALENE NODE;  Surgeon: [name], MD;  Location: [facility];  Service: Thoracic;  Laterality: Right;  . SUBXYPHOID PERICARDIAL WINDOW  04/29/2015  . SUBXYPHOID PERICARDIAL WINDOW N/A 04/29/2015   Procedure: SUBXYPHOID PERICARDIAL WINDOW;  Surgeon: [name], MD;  Location: [facility];  Service: Thoracic;  Laterality: N/A;  . SUPRACLAVICAL NODE BIOPSY Right 03/15/2015   Procedure: RIGHT SUPRACLAVICAL/CERVICAL LYMPH NODE BIOPSY;  Surgeon: [name], MD;  Location: [facility];  Service: Thoracic;  Laterality: Right;  . TEE WITHOUT CARDIOVERSION N/A 04/29/2015   Procedure: TRANSESOPHAGEAL ECHOCARDIOGRAM (TEE);  Surgeon: [name], MD;  Location: [facility];  Service: Thoracic;  Laterality: N/A;  .  VIDEO BRONCHOSCOPY Bilateral 01/22/2015   Procedure: VIDEO BRONCHOSCOPY  WITH FLUORO;  Surgeon: Marshell Garfinkel, MD;  Location: [facility];  Service: Cardiopulmonary;  Laterality: Bilateral;    FAMILY HISTORY:  Family History  Problem Relation Age of Onset  . Narcolepsy Mother   . Heart disease Maternal Grandfather   . Lung disease Maternal Grandfather        From smoking  . Cancer Neg Hx     SOCIAL HISTORY:  Social History   Social History  . Marital status: Single    Spouse name: N/A  . Number of children: N/A  . Years of education: N/A   Occupational History  . Not on file.   Social History Main Topics  . Smoking status: Former Smoker    Quit date: 09/16/2015  . Smokeless tobacco: Never Used     Comment: E cigarettes/Vaped.  . Alcohol use Yes     Comment: occasionally  . Drug use: No  . Sexual activity: Yes   Other Topics Concern  . Not on file   Social History Narrative  . No narrative on file    ALLERGIES: Amoxicillin-pot clavulanate and Bactrim  MEDICATIONS:  No current outpatient prescriptions on file.   No current facility-administered medications for this encounter.    Facility-Administered Medications Ordered in Other Encounters  Medication Dose Route Frequency Provider Last Rate Last Dose  . sodium chloride flush (NS) 0.9 % injection 10 mL  10 mL Intracatheter PRN Curt Bears, MD   10 mL at 09/28/15 1654    REVIEW OF SYSTEMS:  On review of systems, the patient reports that he is doing well overall. He denies any chest pain, shortness of breath, cough, fevers, chills, night sweats, unintended weight changes. He denies any bowel or bladder disturbances, and denies abdominal pain, nausea or vomiting. He denies any new musculoskeletal or joint aches or pains. He has not noticed any change in his energy level and continues to work FT.  A complete review of systems is obtained and is otherwise negative.  PHYSICAL EXAM:  Wt Readings from Last 3 Encounters:  09/07/16 214 lb (97.1 kg)  08/31/16 211 lb (95.7 kg)  06/15/16  210 lb (95.3 kg)   Temp Readings from Last 3 Encounters:  09/07/16 98 F (36.7 C) (Oral)  08/31/16 98.4 F (36.9 C) (Oral)  06/15/16 98.2 F (36.8 C) (Oral)   BP Readings from Last 3 Encounters:  09/07/16 (!) 145/104  08/31/16 (!) 133/91  06/15/16 (!) 125/93   Pulse Readings from Last 3 Encounters:  09/07/16 62  08/31/16 70  06/15/16 70   Pain Assessment Pain Score: 0-No pain/10  In general this is a well appearing african american gentleman in no acute distress. He is alert and oriented x4 and appropriate throughout the examination. HEENT reveals that the patient is normocephalic, atraumatic. EOMs are intact. PERRLA. Skin is intact without any evidence of gross lesions. Cardiovascular exam reveals a regular rate and rhythm, no clicks rubs or murmurs are auscultated. Chest is clear to auscultation bilaterally. Lymphatic assessment is performed and does not reveal any adenopathy in the cervical, supraclavicular, axillary, or inguinal chains. Abdomen has active bowel sounds in all quadrants and is intact. The abdomen is soft, non tender, non distended. Lower extremities are negative for pretibial pitting edema, deep calf tenderness, cyanosis or clubbing.   KPS = 100  100 - Normal; no complaints; no evidence of disease. 90   - Able to carry on normal activity; minor signs or symptoms  of disease. 80   - Normal activity with effort; some signs or symptoms of disease. 43   - Cares for self; unable to carry on normal activity or to do active work. 60   - Requires occasional assistance, but is able to care for most of his personal needs. 50   - Requires considerable assistance and frequent medical care. 2   - Disabled; requires special care and assistance. 17   - Severely disabled; hospital admission is indicated although death not imminent. 51   - Very sick; hospital admission necessary; active supportive treatment necessary. 10   - Moribund; fatal processes progressing rapidly. 0      - Dead  Karnofsky DA, Abelmann Keene, Craver LS and [place] [facility] [phone]) The use of the nitrogen mustards in the palliative treatment of carcinoma: with particular reference to bronchogenic carcinoma Cancer 1 [id number]  LABORATORY DATA:  Lab Results  Component Value Date   WBC 4.5 06/15/2016   HGB 15.2 06/15/2016   HCT 44.1 06/15/2016   MCV 87.7 06/15/2016   PLT 243 06/15/2016   Lab Results  Component Value Date   NA 143 06/15/2016   K 4.1 06/15/2016   CL 103 10/16/2015   CO2 26 06/15/2016   Lab Results  Component Value Date   ALT 17 06/15/2016   AST 18 06/15/2016   ALKPHOS 106 06/15/2016   BILITOT 0.63 06/15/2016     RADIOGRAPHY: No results found.    IMPRESSION/PLAN: 1. 21 y.o. with a history of Stage III nodular sclerosing Hodgkin lymphoma, now with disease recurrence involving the right anterior mediastinal/prevascular soft tissue mass as well as a new hypermetabolic left superior paratracheal lymph node. Today, we talked to the patient and family about the findings and workup thus far. We discussed the natural history of Hodgkin lymphoma and general treatment, highlighting the role of radiotherapy in the management. We discussed the available radiation techniques, and focused on the details of logistics and delivery. The recommendation is for a course of 3 weeks of daily radiotherapy treatments.  We stressed the importance of continuity of treatment once we begin. We reviewed the anticipated acute and late sequelae associated with radiation in this setting. The patient was encouraged to ask questions that were answered to his satisfaction.  At the end of our conversation, he appears to have a good understanding of his disease and treatment options and elects to proceed with radiotherapy. We will share our findings with Dr. Julien Nordmann. The patient is scheduled for CT SIM at 3pm on Monday, 09/11/16.   Nicholos Johns, PA-C    Tyler Pita, MD  [facility] Direct Dial: [phone]  Fax: [phone] [email or web address]  Skype  [email or web address]    Page Me      This document serves as a record of services personally performed by Tyler Pita, MD and Freeman Caldron, PA-C. It was created on their behalf by Bethann Humble, a trained medical scribe. The creation of this record is based on the scribe's personal observations and the provider's statements to them. This document has been checked and approved by the attending provider.

## 2016-09-11 ENCOUNTER — Ambulatory Visit
Admission: RE | Admit: 2016-09-11 | Discharge: 2016-09-11 | Disposition: A | Discharge status: Home / Self Care | Payer: Self-pay | Source: Ambulatory Visit | Attending: Radiation Oncology | Admitting: Radiation Oncology

## 2016-09-11 DIAGNOSIS — C8112 Nodular sclerosis classical Hodgkin lymphoma, intrathoracic lymph nodes: Secondary | ICD-10-CM

## 2016-09-13 NOTE — Progress Notes (Signed)
  Radiation Oncology         [phone] ________________________________  Name: PATRICE MOATES MRN: [id number]  Date: 09/11/2016  DOB: 07-Mar-1995  SIMULATION AND TREATMENT PLANNING NOTE    ICD-10-CM   1. 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  Identity was confirmed.  All relevant records and images related to the planned course of therapy were reviewed.  The patient freely provided informed written consent to proceed with treatment after reviewing the details related to the planned course of therapy. The consent form was witnessed and verified by the simulation staff.  Then, the patient was set-up in a stable reproducible  supine position for radiation therapy.  CT images were obtained.  Surface markings were placed.  The CT images were loaded into the planning software.  Then the target and avoidance structures were contoured.  Treatment planning then occurred.  The radiation prescription was entered and confirmed.  Then, I designed and supervised the construction of a total of 3 medically necessary complex treatment devices including mask for postioning and 2 MLCs to shield critical structures.  I have requested : 3D Simulation  I have requested a DVH of the following structures: spinal cord, parotids, heart, lungs and targets.  PLAN:  The patient will receive up to 45 Gy in 25 fractions with a field reduction after 30.6 Gy.  ________________________________  Sheral Apley. Tammi Klippel, M.D.

## 2016-09-18 ENCOUNTER — Ambulatory Visit
Admission: RE | Admit: 2016-09-18 | Discharge: 2016-09-18 | Disposition: A | Discharge status: Home / Self Care | Payer: Self-pay | Source: Ambulatory Visit | Attending: Radiation Oncology | Admitting: Radiation Oncology

## 2016-09-19 ENCOUNTER — Ambulatory Visit
Admission: RE | Admit: 2016-09-19 | Discharge: 2016-09-19 | Disposition: A | Discharge status: Home / Self Care | Payer: Self-pay | Source: Ambulatory Visit | Attending: Radiation Oncology | Admitting: Radiation Oncology

## 2016-09-20 ENCOUNTER — Ambulatory Visit
Admission: RE | Admit: 2016-09-20 | Discharge: 2016-09-20 | Disposition: A | Discharge status: Home / Self Care | Payer: Self-pay | Source: Ambulatory Visit | Attending: Radiation Oncology | Admitting: Radiation Oncology

## 2016-09-20 ENCOUNTER — Encounter: Payer: Self-pay | Admitting: Radiation Oncology

## 2016-09-20 NOTE — Progress Notes (Signed)
  Radiation Oncology         [phone] ________________________________  Name: Jeffery Crane MRN: [id number]  Date: 09/20/2016  DOB: 1995/09/03  [name] was seen by me today in the cancer center.  Please excuse his tardiness/absence.  Your consideration is appreciated.   Sheral Apley Tammi Klippel, M.D.

## 2016-09-21 ENCOUNTER — Ambulatory Visit
Admission: RE | Admit: 2016-09-21 | Discharge: 2016-09-21 | Disposition: A | Discharge status: Home / Self Care | Payer: Self-pay | Source: Ambulatory Visit | Attending: Radiation Oncology | Admitting: Radiation Oncology

## 2016-09-22 ENCOUNTER — Ambulatory Visit
Admission: RE | Admit: 2016-09-22 | Discharge: 2016-09-22 | Disposition: A | Discharge status: Home / Self Care | Payer: Self-pay | Source: Ambulatory Visit | Attending: Radiation Oncology | Admitting: Radiation Oncology

## 2016-09-25 ENCOUNTER — Ambulatory Visit
Admission: RE | Admit: 2016-09-25 | Discharge: 2016-09-25 | Disposition: A | Discharge status: Home / Self Care | Payer: Self-pay | Source: Ambulatory Visit | Attending: Radiation Oncology | Admitting: Radiation Oncology

## 2016-09-26 ENCOUNTER — Ambulatory Visit
Admission: RE | Admit: 2016-09-26 | Discharge: 2016-09-26 | Disposition: A | Discharge status: Home / Self Care | Payer: Self-pay | Source: Ambulatory Visit | Attending: Radiation Oncology | Admitting: Radiation Oncology

## 2016-09-27 ENCOUNTER — Ambulatory Visit
Admission: RE | Admit: 2016-09-27 | Discharge: 2016-09-27 | Disposition: A | Discharge status: Home / Self Care | Payer: Self-pay | Source: Ambulatory Visit | Attending: Radiation Oncology | Admitting: Radiation Oncology

## 2016-09-28 ENCOUNTER — Ambulatory Visit
Admission: RE | Admit: 2016-09-28 | Discharge: 2016-09-28 | Disposition: A | Discharge status: Home / Self Care | Payer: Self-pay | Source: Ambulatory Visit | Attending: Radiation Oncology | Admitting: Radiation Oncology

## 2016-09-29 ENCOUNTER — Ambulatory Visit
Admission: RE | Admit: 2016-09-29 | Discharge: 2016-09-29 | Disposition: A | Discharge status: Home / Self Care | Payer: Self-pay | Source: Ambulatory Visit | Attending: Radiation Oncology | Admitting: Radiation Oncology

## 2016-10-02 ENCOUNTER — Ambulatory Visit: Payer: Self-pay

## 2016-10-03 ENCOUNTER — Ambulatory Visit: Payer: Self-pay

## 2016-10-03 ENCOUNTER — Other Ambulatory Visit: Payer: Self-pay | Admitting: Urology

## 2016-10-03 ENCOUNTER — Ambulatory Visit
Admission: RE | Admit: 2016-10-03 | Discharge: 2016-10-03 | Disposition: A | Discharge status: Home / Self Care | Payer: Self-pay | Source: Ambulatory Visit | Attending: Radiation Oncology | Admitting: Radiation Oncology

## 2016-10-03 DIAGNOSIS — K209 Esophagitis, unspecified without bleeding: Secondary | ICD-10-CM

## 2016-10-03 MED ORDER — HYDROCODONE-ACETAMINOPHEN 7.5-325 MG/15ML PO SOLN: 10.0000 mL | Freq: Four times a day (QID) | ORAL | 0 refills | Status: DC | PRN

## 2016-10-03 MED ORDER — SUCRALFATE 1 G PO TABS: 1.0000 g | ORAL_TABLET | Freq: Three times a day (TID) | ORAL | 1 refills | Status: DC

## 2016-10-03 NOTE — Progress Notes (Signed)
Patient presented to the nursing clinic requesting to be seen for pain associated with swallowing. Patient reports this pain has been present for 3-4 days. Reports even though he chews his food up well before swallowing it feels like he is swallowing it whole. Two pound weight gain noted since 9/28. Reports non productive cough with increase in frequency. Denies SOB. Advised patient to avoid tomato based and citrus fruits. Encouraged he consume foods and drink at room temperature. In addition, encouraged small frequent meals throughout the day. Advised to take his time eating and chew his food well since he is at increased risk for choking. Patient verbalized understanding of all reviewed.   BP (!) 129/93 (BP Location: Left Arm, Patient Position: Standing, Cuff Size: Normal)   Pulse 97   Temp 98.6 F (37 C) (Oral)   Resp 18   Wt 215 lb 3.2 oz (97.6 kg)   SpO2 98%   BMI 30.88 kg/m  Wt Readings from Last 3 Encounters:  10/03/16 215 lb 3.2 oz (97.6 kg)  09/07/16 214 lb (97.1 kg)  08/31/16 211 lb (95.7 kg)

## 2016-10-03 NOTE — Progress Notes (Signed)
Patient seen today with complaints of new onset esophagitis- feeling like food is sticking in his throat and pain with swallowing ongoing for the past 3-4 days.  He denies fever, chills, chest pain, SOB, hemoptysis or productive cough. Rx provided for Hycet to use prn pain as well as Carafate to use with meals.  Encouraged to drink fluids at room temperature- avoid hot or very cold beverages or foods.  Also encouraged to avoid spicy and acidic foods that will irritate the throat. He knows to call with any changes in his sxs.    Nicholos Johns, PA-C

## 2016-10-04 ENCOUNTER — Ambulatory Visit
Admission: RE | Admit: 2016-10-04 | Discharge: 2016-10-04 | Disposition: A | Discharge status: Home / Self Care | Payer: Self-pay | Source: Ambulatory Visit | Attending: Radiation Oncology | Admitting: Radiation Oncology

## 2016-10-05 ENCOUNTER — Ambulatory Visit
Admission: RE | Admit: 2016-10-05 | Discharge: 2016-10-05 | Disposition: A | Discharge status: Home / Self Care | Payer: Self-pay | Source: Ambulatory Visit | Attending: Radiation Oncology | Admitting: Radiation Oncology

## 2016-10-06 ENCOUNTER — Other Ambulatory Visit: Payer: Self-pay | Admitting: Radiation Oncology

## 2016-10-06 ENCOUNTER — Ambulatory Visit: Payer: Self-pay

## 2016-10-06 ENCOUNTER — Ambulatory Visit
Admission: RE | Admit: 2016-10-06 | Discharge: 2016-10-06 | Disposition: A | Discharge status: Home / Self Care | Payer: Self-pay | Source: Ambulatory Visit | Attending: Radiation Oncology | Admitting: Radiation Oncology

## 2016-10-09 ENCOUNTER — Ambulatory Visit
Admission: RE | Admit: 2016-10-09 | Discharge: 2016-10-09 | Disposition: A | Discharge status: Home / Self Care | Payer: Self-pay | Source: Ambulatory Visit | Attending: Radiation Oncology | Admitting: Radiation Oncology

## 2016-10-09 ENCOUNTER — Ambulatory Visit: Payer: Self-pay

## 2016-10-09 ENCOUNTER — Encounter: Payer: Self-pay | Admitting: Radiation Oncology

## 2016-10-10 ENCOUNTER — Ambulatory Visit: Payer: Self-pay

## 2016-10-11 ENCOUNTER — Ambulatory Visit: Payer: Self-pay

## 2016-10-11 NOTE — Progress Notes (Signed)
  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 The chest was treated to 20 Gy in 10 fractions of 2 Gy. 2. The chest was boosted to 10 Gy in 5 fractions of 2 Gy.  Beams/energy:    1. AP/PA // 10X, 6X 2. AP/PA  // 10X, 6X  Narrative: The patient tolerated radiation treatment relatively well.   The patient experienced increased throat pain despite carafate and 10 mL hydrocodone-acetaminophen x4 per day. Reporting the throat pain made it difficult for him to sleep at night. He reports an episode of dyspnea but denies persistent shortness of breath. Reports frequency and intensity of cough is less. Reports cough is productive with white sputum. Denies hemoptysis. No skin changes within the treatment field.   Plan: The patient has completed radiation treatment. Continue taking carafate and hydrocodone. Double hydrocodone to 20 mL x4 per day. Also recommended switching to high-calorie, softer foods to aid swallowing. The patient will return to radiation oncology clinic for routine followup in one month. I advised him to call or return sooner if he has any questions or concerns related to his recovery or treatment. ________________________________  Sheral Apley. Tammi Klippel, M.D.   This document serves as a record of services personally performed by Tyler Pita, MD. It was created on his behalf by Arlyce Harman, a trained medical scribe. The creation of this record is based on the scribe's personal observations and the provider's statements to them. This document has been checked and approved by the attending provider.

## 2016-10-12 ENCOUNTER — Ambulatory Visit: Payer: Self-pay

## 2016-10-13 ENCOUNTER — Ambulatory Visit: Payer: Self-pay

## 2016-10-16 ENCOUNTER — Ambulatory Visit: Payer: Self-pay

## 2016-10-17 ENCOUNTER — Ambulatory Visit: Payer: Self-pay

## 2016-10-18 ENCOUNTER — Ambulatory Visit: Payer: Self-pay

## 2016-10-19 ENCOUNTER — Ambulatory Visit: Payer: Self-pay

## 2016-10-20 ENCOUNTER — Ambulatory Visit: Payer: Self-pay

## 2016-11-10 ENCOUNTER — Ambulatory Visit: Payer: Self-pay | Admitting: Urology

## 2016-12-08 ENCOUNTER — Ambulatory Visit
Admission: RE | Admit: 2016-12-08 | Discharge: 2016-12-08 | Disposition: A | Discharge status: Home / Self Care | Payer: Self-pay | Source: Ambulatory Visit | Attending: Radiation Oncology | Admitting: Radiation Oncology

## 2016-12-08 ENCOUNTER — Other Ambulatory Visit: Payer: Self-pay

## 2016-12-08 ENCOUNTER — Encounter: Payer: Self-pay | Admitting: Urology

## 2016-12-08 VITALS — BP 129/80 | HR 72 | Temp 97.8°F | Ht 70.0 in | Wt 215.8 lb

## 2016-12-08 DIAGNOSIS — C8112 Nodular sclerosis classical Hodgkin lymphoma, intrathoracic lymph nodes: Secondary | ICD-10-CM

## 2016-12-08 DIAGNOSIS — Z87891 Personal history of nicotine dependence: Secondary | ICD-10-CM | POA: Insufficient documentation

## 2016-12-08 DIAGNOSIS — J984 Other disorders of lung: Secondary | ICD-10-CM

## 2016-12-08 DIAGNOSIS — Z820 Family history of epilepsy and other diseases of the nervous system: Secondary | ICD-10-CM | POA: Insufficient documentation

## 2016-12-08 DIAGNOSIS — Z9221 Personal history of antineoplastic chemotherapy: Secondary | ICD-10-CM | POA: Insufficient documentation

## 2016-12-08 DIAGNOSIS — K219 Gastro-esophageal reflux disease without esophagitis: Secondary | ICD-10-CM | POA: Insufficient documentation

## 2016-12-08 DIAGNOSIS — C8118 Nodular sclerosis classical Hodgkin lymphoma, lymph nodes of multiple sites: Secondary | ICD-10-CM | POA: Insufficient documentation

## 2016-12-08 DIAGNOSIS — F909 Attention-deficit hyperactivity disorder, unspecified type: Secondary | ICD-10-CM | POA: Insufficient documentation

## 2016-12-08 DIAGNOSIS — Z510 Encounter for antineoplastic radiation therapy: Secondary | ICD-10-CM | POA: Insufficient documentation

## 2016-12-08 NOTE — Progress Notes (Signed)
Jeffery Crane is here for follow up.  He denies having pain.  He reports having a dry cough and some shortness of breath from having a cold.  He said the pain in his throat resolved about 3 days after his treatments ended.  He is not taking any pain medication or carafate.  He denies having fatigue or skin irritation.  BP 129/80 (BP Location: Right Arm, Patient Position: Sitting)   Pulse 72   Temp 97.8 F (36.6 C) (Oral)   Ht 5\' 10"  (1.778 m)   Wt 215 lb 12.8 oz (97.9 kg)   SpO2 99%   BMI 30.96 kg/m    Wt Readings from Last 3 Encounters:  12/08/16 215 lb 12.8 oz (97.9 kg)  10/03/16 215 lb 3.2 oz (97.6 kg)  09/07/16 214 lb (97.1 kg)

## 2016-12-08 NOTE — Progress Notes (Signed)
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  Interval Since Last Radiation:  8 weeks  09/18/2016 to 10/09/2016:   1. The chest was treated to 20 Gy in 10 fractions of 2 Gy. 2. The chest was boosted to 10 Gy in 5 fractions of 2 Gy.  Narrative:  The patient returns today for routine follow-up.  He tolerated radiation treatment relatively well.   Het experienced increased throat pain despite carafate and 10 mL hydrocodone-acetaminophen x4 per day. Near the end of treatment, he reported the throat pain made it difficult for him to sleep at night. He had an episode of dyspnea but denies persistent shortness of breath and reported frequency and intensity of cough is less. The cough was productive with white sputum but he denied hemoptysis. He did not experience any skin changes within the treatment field.                             On review of systems, the patient states that he is doing very well in general.  The dysphagia and throat pain completely resolved within 3-4 days after completing radiotherapy.  The productive cough has resolved as well and he denies recent fever, chills, night sweats, chest pain or shortness of breath.  He has a good appetite and is able to eat and drink as he pleases, maintaining his weight.  He denies abdominal pain, N/V or diarrhea.  ALLERGIES:  is allergic to amoxicillin-pot clavulanate and bactrim.  Meds: Current Outpatient Medications  Medication Sig Dispense Refill  . Multiple Vitamins-Minerals (MULTIVITAMIN WITH MINERALS) tablet Take 1 tablet by mouth daily.    Marland Kitchen HYDROcodone-acetaminophen (HYCET) 7.5-325 mg/15 ml solution Take 10 mLs by  mouth 4 (four) times daily as needed for moderate pain. (Patient not taking: Reported on 12/08/2016) 473 mL 0   No current facility-administered medications for this encounter.    Facility-Administered Medications Ordered in Other Encounters  Medication Dose Route Frequency Provider Last Rate Last Dose  . sodium chloride flush (NS) 0.9 % injection 10 mL  10 mL Intracatheter PRN Curt Bears, MD   10 mL at 09/28/15 1654    Physical Findings:  height is 5\' 10"  (1.778 m) and weight is 215 lb 12.8 oz (97.9 kg). His oral temperature is 97.8 F (36.6 C). His blood pressure is 129/80 and his pulse is 72. His oxygen saturation is 99%.  Pain Assessment Pain Score: 0-No pain/10 In general this is a well appearing african [place] male in no acute distress. He's alert and oriented x4 and appropriate throughout the examination. Cardiopulmonary assessment is negative for acute distress and he exhibits normal effort. There is no residual hyperpigmentation or erythema in the treatment field.  Lab Findings: Lab Results  Component Value Date   WBC 4.5 06/15/2016   HGB 15.2 06/15/2016   HCT 44.1 06/15/2016   MCV 87.7 06/15/2016   PLT 243 06/15/2016     Radiographic Findings: No results found.  Impression/Plan: 34. 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  He appears to have recovered well from the  effects of radiotherapy. He has a planned follow-up with Dr. Julien Nordmann on 12/13/2016 and anticipates a follow-up PET scan for disease restaging in January 2019.   We discussed that while we are happy to continue to participate in his care if clinically indicated, at this point, we will plan to see him back on an as-needed basis. He will continue his routine follow-up for disease surveillance and management under the care and direction of Dr. Julien Nordmann. He knows to call with any questions or concerns related to his previous  radiotherapy. He is comfortable with and is in agreement with this plan.      Nicholos Johns, PA-C

## 2016-12-13 ENCOUNTER — Ambulatory Visit: Payer: Self-pay | Admitting: Oncology

## 2016-12-13 ENCOUNTER — Other Ambulatory Visit: Payer: Self-pay

## 2017-01-26 ENCOUNTER — Ambulatory Visit (HOSPITAL_COMMUNITY)
Admission: RE | Admit: 2017-01-26 | Discharge: 2017-01-26 | Disposition: A | Discharge status: Home / Self Care | Payer: Self-pay | Source: Ambulatory Visit | Attending: Internal Medicine | Admitting: Internal Medicine

## 2017-01-26 ENCOUNTER — Inpatient Hospital Stay: Payer: Medicaid Other | Attending: Internal Medicine

## 2017-01-26 DIAGNOSIS — Z9221 Personal history of antineoplastic chemotherapy: Secondary | ICD-10-CM | POA: Insufficient documentation

## 2017-01-26 DIAGNOSIS — C8112 Nodular sclerosis classical Hodgkin lymphoma, intrathoracic lymph nodes: Secondary | ICD-10-CM

## 2017-01-26 DIAGNOSIS — J984 Other disorders of lung: Secondary | ICD-10-CM | POA: Insufficient documentation

## 2017-01-26 DIAGNOSIS — Z923 Personal history of irradiation: Secondary | ICD-10-CM | POA: Insufficient documentation

## 2017-01-26 DIAGNOSIS — J341 Cyst and mucocele of nose and nasal sinus: Secondary | ICD-10-CM | POA: Insufficient documentation

## 2017-01-26 DIAGNOSIS — C8118 Nodular sclerosis classical Hodgkin lymphoma, lymph nodes of multiple sites: Secondary | ICD-10-CM | POA: Insufficient documentation

## 2017-01-26 LAB — COMPREHENSIVE METABOLIC PANEL
ALT: 17 U/L (ref 0–55)
AST: 21 U/L (ref 5–34)
Albumin: 4.1 g/dL (ref 3.5–5.0)
Alkaline Phosphatase: 85 U/L (ref 40–150)
Anion gap: 7 (ref 3–11)
BUN: 8 mg/dL (ref 7–26)
CHLORIDE: 105 mmol/L (ref 98–109)
CO2: 28 mmol/L (ref 22–29)
CREATININE: 0.88 mg/dL (ref 0.70–1.30)
Calcium: 9.4 mg/dL (ref 8.4–10.4)
GFR calc Af Amer: >60 mL/min (ref >60)
Glucose, Bld: 98 mg/dL (ref 70–140)
Potassium: 4.5 mmol/L (ref 3.5–5.1)
Sodium: 140 mmol/L (ref 136–145)
Total Bilirubin: 0.7 mg/dL (ref 0.2–1.2)
Total Protein: 7.4 g/dL (ref 6.4–8.3)

## 2017-01-26 LAB — LACTATE DEHYDROGENASE: LDH: 170 U/L (ref 125–245)

## 2017-01-26 LAB — CBC WITH DIFFERENTIAL/PLATELET
Basophils Absolute: 0 10*3/uL (ref 0.0–0.1)
Basophils Relative: 1 %
EOS ABS: 0.1 10*3/uL (ref 0.0–0.5)
EOS PCT: 1 %
HCT: 44.4 % (ref 38.4–49.9)
Hemoglobin: 15 g/dL (ref 13.0–17.1)
LYMPHS ABS: 1.4 10*3/uL (ref 0.9–3.3)
Lymphocytes Relative: 36 %
MCH: 30.1 pg (ref 27.2–33.4)
MCHC: 33.8 g/dL (ref 32.0–36.0)
MCV: 89.3 fL (ref 79.3–98.0)
MONO ABS: 0.4 10*3/uL (ref 0.1–0.9)
MONOS PCT: 10 %
Neutro Abs: 2 10*3/uL (ref 1.5–6.5)
Neutrophils Relative %: 52 %
PLATELETS: 228 10*3/uL (ref 140–400)
RBC: 4.97 MIL/uL (ref 4.20–5.82)
RDW: 13.5 % (ref 11.0–15.6)
WBC: 4 10*3/uL (ref 4.0–10.3)

## 2017-01-26 LAB — GLUCOSE, CAPILLARY: GLUCOSE-CAPILLARY: 75 mg/dL (ref 65–99)

## 2017-01-26 MED ORDER — FLUDEOXYGLUCOSE F - 18 (FDG) INJECTION: 12.0000 | Freq: Once | INTRAVENOUS | Status: DC | PRN

## 2017-01-29 ENCOUNTER — Telehealth: Payer: Self-pay | Admitting: Internal Medicine

## 2017-01-29 ENCOUNTER — Inpatient Hospital Stay (HOSPITAL_BASED_OUTPATIENT_CLINIC_OR_DEPARTMENT_OTHER): Payer: Self-pay | Admitting: Internal Medicine

## 2017-01-29 ENCOUNTER — Encounter: Payer: Self-pay | Admitting: Internal Medicine

## 2017-01-29 VITALS — BP 122/85 | HR 87 | Temp 98.5°F | Resp 18 | Ht 70.0 in | Wt 223.2 lb

## 2017-01-29 DIAGNOSIS — Z923 Personal history of irradiation: Secondary | ICD-10-CM

## 2017-01-29 DIAGNOSIS — C8118 Nodular sclerosis classical Hodgkin lymphoma, lymph nodes of multiple sites: Secondary | ICD-10-CM

## 2017-01-29 DIAGNOSIS — Z9221 Personal history of antineoplastic chemotherapy: Secondary | ICD-10-CM

## 2017-01-29 DIAGNOSIS — C8112 Nodular sclerosis classical Hodgkin lymphoma, intrathoracic lymph nodes: Secondary | ICD-10-CM

## 2017-01-29 NOTE — Progress Notes (Signed)
[place] [phone]   Fax:(336) [place], [facility] [place] [address]  DIAGNOSIS: Stage III nodular sclerosing Hodgkin lymphoma presented with cavitary masses in the right lung in addition to mediastinal, supraclavicular, axillary as well as splenic involvement diagnosed in March 2017  PRIOR THERAPY:  1) Status post pericardiocentesis with drainage of pericardial fluid secondary to Hodgkin's lymphoma. Cytology was negative for lymphoma. 2) status post subxiphoid pericardial window under the care of Dr. Roxan Hockey on 04/29/2015. 3) Systemic chemotherapy with ABVD. First dose 04/20/2015. Status post 6 cycles.  4) curative radiotherapy to the recurrent mediastinal adenopathy under the care of Dr. Tammi Klippel completed October 09, 2016.  CURRENT THERAPY: Observation.  INTERVAL HISTORY: Jeffery Crane 22 y.o. male returns to the clinic today for follow-up visit.  The patient tolerated the previous course of curative radiotherapy to the recurrent mediastinal lymphadenopathy fairly well.  He had sore throat and some difficulty swallowing at that time.  This is completely resolved at this point.  The patient denied having any chest pain, shortness of breath, cough or hemoptysis.  He denied having any weight loss or night sweats.  He has no nausea, vomiting, diarrhea or constipation.  He denied having any palpable lymphadenopathy.  He has no fever or chills.  He had repeat PET scan performed recently and is here for evaluation and discussion of his discuss results and treatment options.   MEDICAL HISTORY: Past Medical History:  Diagnosis Date  . Adult ADHD   . Anemia   . Chemotherapy induced neutropenia ([place]) 06/04/2015  . Encounter for antineoplastic chemotherapy 05/04/2015  . GERD (gastroesophageal reflux disease)   . Headache   . Nodular sclerosing Hodgkin's lymphoma ([place])   . 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 Pneumonia     ALLERGIES:  is allergic to amoxicillin-pot clavulanate and bactrim.  MEDICATIONS:  Current Outpatient Medications  Medication Sig Dispense Refill  . HYDROcodone-acetaminophen (HYCET) 7.5-325 mg/15 ml solution Take 10 mLs by mouth 4 (four) times daily as needed for moderate pain. (Patient not taking: Reported on 12/08/2016) 473 mL 0  . Multiple Vitamins-Minerals (MULTIVITAMIN WITH MINERALS) tablet Take 1 tablet by mouth daily.     No current facility-administered medications for this visit.    Facility-Administered Medications Ordered in Other Visits  Medication Dose Route Frequency Provider Last Rate Last Dose  . fludeoxyglucose F - 18 (FDG) injection 12 millicurie  12 millicurie Intravenous Once PRN Rolm Baptise, MD      . sodium chloride flush (NS) 0.9 % injection 10 mL  10 mL Intracatheter PRN Curt Bears, MD   10 mL at 09/28/15 1654    SURGICAL HISTORY:  Past Surgical History:  Procedure Laterality Date  . CARDIAC CATHETERIZATION N/A 04/01/2015   Procedure: Pericardiocentesis;  Surgeon: Sherren Mocha, MD;  Location: [facility];  Service: Cardiovascular;  Laterality: N/A;  . SCALENE NODE BIOPSY Right 03/15/2015   Procedure: BIOPSY SCALENE NODE;  Surgeon: [name], MD;  Location: [facility];  Service: Thoracic;  Laterality: Right;  . SUBXYPHOID PERICARDIAL WINDOW  04/29/2015  . SUBXYPHOID PERICARDIAL WINDOW N/A 04/29/2015   Procedure: SUBXYPHOID PERICARDIAL WINDOW;  Surgeon: [name], MD;  Location: [facility];  Service: Thoracic;  Laterality: N/A;  . SUPRACLAVICAL NODE BIOPSY Right 03/15/2015   Procedure: RIGHT SUPRACLAVICAL/CERVICAL LYMPH NODE BIOPSY;  Surgeon: [name], MD;  [facility];  Service: Thoracic;  Laterality: Right;  . TEE WITHOUT CARDIOVERSION N/A 04/29/2015   Procedure: TRANSESOPHAGEAL ECHOCARDIOGRAM (TEE);  Surgeon: [name], MD;   Location: [facility];  Service: Thoracic;  Laterality: N/A;  . VIDEO BRONCHOSCOPY Bilateral 01/22/2015   Procedure: VIDEO BRONCHOSCOPY WITH FLUORO;  Surgeon: Marshell Garfinkel, MD;  Location: [facility];  Service: Cardiopulmonary;  Laterality: Bilateral;    REVIEW OF SYSTEMS:  Constitutional: negative Eyes: negative Ears, nose, mouth, throat, and face: negative Respiratory: negative Cardiovascular: negative Gastrointestinal: negative Genitourinary:negative Integument/breast: negative Hematologic/lymphatic: negative Musculoskeletal:negative Neurological: negative Behavioral/Psych: negative Endocrine: negative Allergic/Immunologic: negative   PHYSICAL EXAMINATION: General appearance: alert, cooperative and no distress Head: Normocephalic, without obvious abnormality, atraumatic Neck: marked anterior cervical adenopathy and thyroid not enlarged, symmetric, no tenderness/mass/nodules Lymph nodes: Cervical, supraclavicular, and axillary nodes normal. Resp: clear to auscultation bilaterally Back: symmetric, no curvature. ROM normal. No CVA tenderness. Cardio: regular rate and rhythm, S1, S2 normal, no murmur, click, rub or gallop GI: soft, non-tender; bowel sounds normal; no masses,  no organomegaly Extremities: extremities normal, atraumatic, no cyanosis or edema Neurologic: Alert and oriented X 3, normal strength and tone. Normal symmetric reflexes. Normal coordination and gait   ECOG PERFORMANCE STATUS: 0 - Asymptomatic  Blood pressure 122/85, pulse 87, temperature 98.5 F (36.9 C), temperature source Oral, resp. rate 18, height 5\' 10"  (1.778 m), weight 223 lb 3.2 oz (101.2 kg), SpO2 98 %.  LABORATORY DATA: Lab Results  Component Value Date   WBC 4.0 01/26/2017   HGB 15.0 01/26/2017   HCT 44.4 01/26/2017   MCV 89.3 01/26/2017   PLT 228 01/26/2017      Chemistry      Component Value Date/Time   NA 140 01/26/2017 1248   NA 143 06/15/2016 1124   K 4.5 01/26/2017 1248   K 4.1  06/15/2016 1124   CL 105 01/26/2017 1248   CL 105 12/18/2013 0848   CO2 28 01/26/2017 1248   CO2 26 06/15/2016 1124   BUN 8 01/26/2017 1248   BUN 8.9 06/15/2016 1124   CREATININE 0.88 01/26/2017 1248   CREATININE 0.9 06/15/2016 1124      Component Value Date/Time   CALCIUM 9.4 01/26/2017 1248   CALCIUM 9.4 06/15/2016 1124   ALKPHOS 85 01/26/2017 1248   ALKPHOS 106 06/15/2016 1124   AST 21 01/26/2017 1248   AST 18 06/15/2016 1124   ALT 17 01/26/2017 1248   ALT 17 06/15/2016 1124   BILITOT 0.7 01/26/2017 1248   BILITOT 0.63 06/15/2016 1124       RADIOGRAPHIC STUDIES: Nm Pet Image Restag (ps) Skull Base To Thigh  Result Date: 01/26/2017 CLINICAL DATA:  Subsequent treatment strategy for nodular sclerosis Hodgkin's lymphoma. EXAM: NUCLEAR MEDICINE PET SKULL BASE TO THIGH TECHNIQUE: 12.0 mCi F-18 FDG was injected intravenously. Full-ring PET imaging was performed from the skull base to thigh after the radiotracer. CT data was obtained and used for attenuation correction and anatomic localization. FASTING BLOOD GLUCOSE:  Value: 75 mg/dl COMPARISON:  07/18/2016 FINDINGS: Reference values: Background mediastinal blood pool activity: 2.1 Background hepatic activity: 3.6 NECK No hypermetabolic lymph nodes in the neck. Relatively symmetric activity in the tonsillar pillars similar to the prior exam and likely physiologic. Symmetric tongue base activity. Small mucous retention cyst in the right maxillary sinus. CHEST 2.7 by 1.6 cm prevascular soft tissue density eccentric to the right with internal calcification, previous measurement 4.2 by 3.1 cm. This lesion has a maximum standard uptake value of 2.3 (  [place] 3), formerly 8.0. The left upper paratracheal lymph node has completely resolved ([place]auville 1). This previously measured 1.2 cm and previously had a maximum SUV of 8.0. No new adenopathy or new thoracic mass identified. No hypermetabolic lung nodule on the CT data. There is some mild scarring  medially in the right upper lobe. Right Port-A-Cath tip: Cavoatrial junction. ABDOMEN/PELVIS No abnormal hypermetabolic activity within the liver, pancreas, adrenal glands, or spleen. No hypermetabolic lymph nodes in the abdomen or pelvis. Normal splenic volume. SKELETON No focal hypermetabolic activity to suggest skeletal metastasis. IMPRESSION: 1. Anterior mediastinal/prevascular soft tissue mass notably reduced in volume, and currently has a maximum standard uptake value of 2.3 ([place]ville 3), reduced from prior SUV of 8.0. 2. The left upper paratracheal lymph node has completely resolved (Deauville 1). 3. No new abnormal hypermetabolic activity. 4. Other imaging findings of potential clinical significance: Mucous retention cyst in the right maxillary sinus. Mild scarring medially in the right upper lobe. Electronically Signed   By: Van Clines M.D.   On: 01/26/2017 16:32   ASSESSMENT AND PLAN:  This is a very pleasant 22 years old African-American male with a stage III nodular sclerosing Hodgkin lymphoma with bulky lymphadenopathy in the right lower cervical, right supraclavicular, right axillary as well as mediastinal lymph nodes in addition to cavitary lung masses and splenic involvement diagnosed in March 2017. He is status post 6 cycles of systemic chemotherapy with ABVD. He was in observation for several months but was noncompliant with his follow-up visit.  Previous imaging studies showed evidence for disease recurrence involving left superior paratracheal lymph node as well as soft tissue mass. The patient underwent a curative radiotherapy under the care of Dr. Tammi Klippel completed in October 2018. He had repeat PET scan performed recently. I personally and independently reviewed the PET scan images and discussed the results with the patient today. His PET scan showed significant improvement of his disease with reduction in the volume as well as the SUV as well as resolution of the left upper  paratracheal lymph node. I recommended for the patient to continue on observation for now with repeat CT scan of the neck and chest in 6 months. The patient was advised to call immediately if he has any concerning symptoms in the interval. The patient voices understanding of current disease status and treatment options and is in agreement with the current care plan.  All questions were answered. The patient knows to call the clinic with any problems, questions or concerns. We can certainly see the patient much sooner if necessary.  Disclaimer: This note was dictated with voice recognition software. Similar sounding words can inadvertently be transcribed and may not be corrected upon review.

## 2017-01-29 NOTE — Telephone Encounter (Signed)
Gave avs and calendar for july °

## 2017-07-30 ENCOUNTER — Ambulatory Visit (HOSPITAL_COMMUNITY)
Admission: RE | Admit: 2017-07-30 | Discharge: 2017-07-30 | Disposition: A | Discharge status: Home / Self Care | Payer: Self-pay | Source: Ambulatory Visit | Attending: Internal Medicine | Admitting: Internal Medicine

## 2017-07-30 ENCOUNTER — Encounter (HOSPITAL_COMMUNITY): Payer: Self-pay

## 2017-07-30 ENCOUNTER — Inpatient Hospital Stay: Payer: Self-pay | Attending: Internal Medicine

## 2017-07-30 DIAGNOSIS — R740 Nonspecific elevation of levels of transaminase and lactic acid dehydrogenase [LDH]: Secondary | ICD-10-CM | POA: Insufficient documentation

## 2017-07-30 DIAGNOSIS — C8112 Nodular sclerosis classical Hodgkin lymphoma, intrathoracic lymph nodes: Secondary | ICD-10-CM

## 2017-07-30 DIAGNOSIS — R748 Abnormal levels of other serum enzymes: Secondary | ICD-10-CM | POA: Insufficient documentation

## 2017-07-30 DIAGNOSIS — Z7289 Other problems related to lifestyle: Secondary | ICD-10-CM | POA: Insufficient documentation

## 2017-07-30 DIAGNOSIS — C8118 Nodular sclerosis classical Hodgkin lymphoma, lymph nodes of multiple sites: Secondary | ICD-10-CM | POA: Insufficient documentation

## 2017-07-30 DIAGNOSIS — Z9114 Patient's other noncompliance with medication regimen: Secondary | ICD-10-CM | POA: Insufficient documentation

## 2017-07-30 DIAGNOSIS — R911 Solitary pulmonary nodule: Secondary | ICD-10-CM | POA: Insufficient documentation

## 2017-07-30 LAB — CBC WITH DIFFERENTIAL (CANCER CENTER ONLY)
Basophils Absolute: 0 10*3/uL (ref 0.0–0.1)
Basophils Relative: 1 %
Eosinophils Absolute: 0.1 10*3/uL (ref 0.0–0.5)
Eosinophils Relative: 2 %
HCT: 42.5 % (ref 38.4–49.9)
HEMOGLOBIN: 15 g/dL (ref 13.0–17.1)
LYMPHS ABS: 1.6 10*3/uL (ref 0.9–3.3)
Lymphocytes Relative: 42 %
MCH: 31.3 pg (ref 27.2–33.4)
MCHC: 35.3 g/dL (ref 32.0–36.0)
MCV: 88.5 fL (ref 79.3–98.0)
MONOS PCT: 8 %
Monocytes Absolute: 0.3 10*3/uL (ref 0.1–0.9)
NEUTROS ABS: 1.9 10*3/uL (ref 1.5–6.5)
NEUTROS PCT: 47 %
Platelet Count: 182 10*3/uL (ref 140–400)
RBC: 4.8 MIL/uL (ref 4.20–5.82)
RDW: 12.7 % (ref 11.0–14.6)
WBC Count: 3.9 10*3/uL — ABNORMAL LOW (ref 4.0–10.3)

## 2017-07-30 LAB — CMP (CANCER CENTER ONLY)
ALT: 74 U/L — ABNORMAL HIGH (ref 0–44)
AST: 166 U/L — ABNORMAL HIGH (ref 15–41)
Albumin: 4.3 g/dL (ref 3.5–5.0)
Alkaline Phosphatase: 85 U/L (ref 38–126)
Anion gap: 6 (ref 5–15)
BUN: 9 mg/dL (ref 6–20)
CO2: 27 mmol/L (ref 22–32)
Calcium: 9.4 mg/dL (ref 8.9–10.3)
Chloride: 107 mmol/L (ref 98–111)
Creatinine: 0.83 mg/dL (ref 0.61–1.24)
GFR, Est AFR Am: >60 mL/min (ref >60)
GFR, Estimated: >60 mL/min (ref >60)
Glucose, Bld: 108 mg/dL — ABNORMAL HIGH (ref 70–99)
Potassium: 4.2 mmol/L (ref 3.5–5.1)
Sodium: 140 mmol/L (ref 135–145)
Total Bilirubin: 0.5 mg/dL (ref 0.3–1.2)
Total Protein: 7.4 g/dL (ref 6.5–8.1)

## 2017-07-30 LAB — LACTATE DEHYDROGENASE: LDH: 567 U/L — AB (ref 98–192)

## 2017-07-30 MED ORDER — IOPAMIDOL (ISOVUE-300) INJECTION 61%
100.0000 mL | Freq: Once | INTRAVENOUS | Status: AC | PRN
  Administered 2017-07-30: 100 mL via INTRAVENOUS

## 2017-07-30 MED ORDER — IOPAMIDOL (ISOVUE-300) INJECTION 61%
INTRAVENOUS | Status: AC
  Filled 2017-07-30: qty 100

## 2017-08-01 ENCOUNTER — Telehealth: Payer: Self-pay | Admitting: Internal Medicine

## 2017-08-01 ENCOUNTER — Inpatient Hospital Stay (HOSPITAL_BASED_OUTPATIENT_CLINIC_OR_DEPARTMENT_OTHER): Payer: Self-pay | Admitting: Internal Medicine

## 2017-08-01 ENCOUNTER — Encounter: Payer: Self-pay | Admitting: Internal Medicine

## 2017-08-01 VITALS — BP 139/100 | HR 75 | Temp 97.7°F | Resp 18 | Ht 70.0 in | Wt 219.2 lb

## 2017-08-01 DIAGNOSIS — C8112 Nodular sclerosis classical Hodgkin lymphoma, intrathoracic lymph nodes: Secondary | ICD-10-CM

## 2017-08-01 DIAGNOSIS — Z7289 Other problems related to lifestyle: Secondary | ICD-10-CM

## 2017-08-01 DIAGNOSIS — R748 Abnormal levels of other serum enzymes: Secondary | ICD-10-CM

## 2017-08-01 DIAGNOSIS — R740 Nonspecific elevation of levels of transaminase and lactic acid dehydrogenase [LDH]: Secondary | ICD-10-CM

## 2017-08-01 DIAGNOSIS — K7689 Other specified diseases of liver: Secondary | ICD-10-CM | POA: Insufficient documentation

## 2017-08-01 DIAGNOSIS — C8118 Nodular sclerosis classical Hodgkin lymphoma, lymph nodes of multiple sites: Secondary | ICD-10-CM

## 2017-08-01 HISTORY — DX: Other specified diseases of liver: K76.89

## 2017-08-01 NOTE — Progress Notes (Signed)
[place] [phone]   Fax:(336) [place], [facility] [place] [address]  DIAGNOSIS: Stage III nodular sclerosing Hodgkin lymphoma presented with cavitary masses in the right lung in addition to mediastinal, supraclavicular, axillary as well as splenic involvement diagnosed in March 2017  PRIOR THERAPY:  1) Status post pericardiocentesis with drainage of pericardial fluid secondary to Hodgkin's lymphoma. Cytology was negative for lymphoma. 2) status post subxiphoid pericardial window under the care of Dr. Roxan Hockey on 04/29/2015. 3) Systemic chemotherapy with ABVD. First dose 04/20/2015. Status post 6 cycles.  4) curative radiotherapy to the recurrent mediastinal adenopathy under the care of Dr. Tammi Klippel completed October 09, 2016.  CURRENT THERAPY: Observation.  INTERVAL HISTORY: Jeffery Crane 22 y.o. male returns to the clinic today for six-month follow-up visit.  The patient is feeling fine today with no specific complaints.  He exercises at regular basis.  He is using CBD oil and he was drinking a lot of alcohol recently.  He denied having any chest pain, shortness of breath, cough or hemoptysis.  He denied having any fever or chills.  He has no nausea, vomiting, diarrhea or constipation.  He denied having any headache or visual changes.  He had a repeat CT scan of the neck and chest performed recently and he is here for evaluation and discussion of his discuss results.    MEDICAL HISTORY: Past Medical History:  Diagnosis Date  . Adult ADHD   . Anemia   . Chemotherapy induced neutropenia ([facility]) 06/04/2015  . Encounter for antineoplastic chemotherapy 05/04/2015  . GERD (gastroesophageal reflux disease)   . Headache   . Nodular sclerosing Hodgkin's lymphoma ([facility])   . 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 Pneumonia     ALLERGIES:   is allergic to amoxicillin-pot clavulanate and bactrim.  MEDICATIONS:  Current Outpatient Medications  Medication Sig Dispense Refill  . HYDROcodone-acetaminophen (HYCET) 7.5-325 mg/15 ml solution Take 10 mLs by mouth 4 (four) times daily as needed for moderate pain. (Patient not taking: Reported on 12/08/2016) 473 mL 0  . Multiple Vitamins-Minerals (MULTIVITAMIN WITH MINERALS) tablet Take 1 tablet by mouth daily.     No current facility-administered medications for this visit.    Facility-Administered Medications Ordered in Other Visits  Medication Dose Route Frequency Provider Last Rate Last Dose  . sodium chloride flush (NS) 0.9 % injection 10 mL  10 mL Intracatheter PRN Curt Bears, MD   10 mL at 09/28/15 1654    SURGICAL HISTORY:  Past Surgical History:  Procedure Laterality Date  . CARDIAC CATHETERIZATION N/A 04/01/2015   Procedure: Pericardiocentesis;  Surgeon: Sherren Mocha, MD;  Location: [facility];  Service: Cardiovascular;  Laterality: N/A;  . SCALENE NODE BIOPSY Right 03/15/2015   Procedure: BIOPSY SCALENE NODE;  Surgeon: [name], MD;  Location: [facility];  Service: Thoracic;  Laterality: Right;  . SUBXYPHOID PERICARDIAL WINDOW  04/29/2015  . SUBXYPHOID PERICARDIAL WINDOW N/A 04/29/2015   Procedure: SUBXYPHOID PERICARDIAL WINDOW;  Surgeon: [name], MD;  Location: [facility];  Service: Thoracic;  Laterality: N/A;  . SUPRACLAVICAL NODE BIOPSY Right 03/15/2015   Procedure: RIGHT SUPRACLAVICAL/CERVICAL LYMPH NODE BIOPSY;  Surgeon: [name], MD;  Location: [facility];  Service: Thoracic;  Laterality: Right;  . TEE WITHOUT CARDIOVERSION N/A 04/29/2015   Procedure: TRANSESOPHAGEAL ECHOCARDIOGRAM (TEE);  Surgeon: [name], MD;  [facility];  Service: Thoracic;  Laterality: N/A;  . VIDEO BRONCHOSCOPY Bilateral 01/22/2015   Procedure: VIDEO BRONCHOSCOPY WITH FLUORO;  Surgeon: Marshell Garfinkel, MD;  Location: [facility];  Service:  Cardiopulmonary;  Laterality: Bilateral;    REVIEW OF SYSTEMS:  A comprehensive review of systems was negative.   PHYSICAL EXAMINATION: General appearance: alert, cooperative and no distress Head: Normocephalic, without obvious abnormality, atraumatic Neck: marked anterior cervical adenopathy and thyroid not enlarged, symmetric, no tenderness/mass/nodules Lymph nodes: Cervical, supraclavicular, and axillary nodes normal. Resp: clear to auscultation bilaterally Back: symmetric, no curvature. ROM normal. No CVA tenderness. Cardio: regular rate and rhythm, S1, S2 normal, no murmur, click, rub or gallop GI: soft, non-tender; bowel sounds normal; no masses,  no organomegaly Extremities: extremities normal, atraumatic, no cyanosis or edema   ECOG PERFORMANCE STATUS: 0 - Asymptomatic  Blood pressure (!) 139/100, pulse 75, temperature 97.7 F (36.5 C), temperature source Oral, resp. rate 18, height 5\' 10"  (1.778 m), weight 219 lb 3.2 oz (99.4 kg), SpO2 100 %.  LABORATORY DATA: Lab Results  Component Value Date   WBC 3.9 (L) 07/30/2017   HGB 15.0 07/30/2017   HCT 42.5 07/30/2017   MCV 88.5 07/30/2017   PLT 182 07/30/2017      Chemistry      Component Value Date/Time   NA 140 07/30/2017 1131   NA 143 06/15/2016 1124   K 4.2 07/30/2017 1131   K 4.1 06/15/2016 1124   CL 107 07/30/2017 1131   CL 105 12/18/2013 0848   CO2 27 07/30/2017 1131   CO2 26 06/15/2016 1124   BUN 9 07/30/2017 1131   BUN 8.9 06/15/2016 1124   CREATININE 0.83 07/30/2017 1131   CREATININE 0.9 06/15/2016 1124      Component Value Date/Time   CALCIUM 9.4 07/30/2017 1131   CALCIUM 9.4 06/15/2016 1124   ALKPHOS 85 07/30/2017 1131   ALKPHOS 106 06/15/2016 1124   AST 166 (H) 07/30/2017 1131   AST 18 06/15/2016 1124   ALT 74 (H) 07/30/2017 1131   ALT 17 06/15/2016 1124   BILITOT 0.5 07/30/2017 1131   BILITOT 0.63 06/15/2016 1124       RADIOGRAPHIC STUDIES: Ct Soft Tissue Neck W Contrast  Result Date:  07/30/2017 CLINICAL DATA:  Hodgkin's lymphoma. EXAM: CT NECK WITH CONTRAST TECHNIQUE: Multidetector CT imaging of the neck was performed using the standard protocol following the bolus administration of intravenous contrast. CONTRAST:  127mL ISOVUE-300 IOPAMIDOL (ISOVUE-300) INJECTION 61% COMPARISON:  None. FINDINGS: PHARYNX AND LARYNX: --Nasopharynx: Fossae of Rosenmuller are clear. Normal adenoid tonsils for age. --Oral cavity and oropharynx: The palatine and lingual tonsils are normal. The visible oral cavity and floor of mouth are normal. --Hypopharynx: Normal vallecula and pyriform sinuses. --Larynx: Normal epiglottis and pre-epiglottic space. Normal aryepiglottic and vocal folds. --Retropharyngeal space: No abscess, effusion or lymphadenopathy. SALIVARY GLANDS: --Parotid: No mass lesion or inflammation. No sialolithiasis or ductal dilatation. --Submandibular: Symmetric without inflammation. No sialolithiasis or ductal dilatation. --Sublingual: Normal. No ranula or other visible lesion of the base of tongue and floor of mouth. THYROID: Normal. LYMPH NODES: No enlarged or abnormal density lymph nodes. VASCULAR: Major cervical vessels are patent. LIMITED INTRACRANIAL: Normal. VISUALIZED ORBITS: Normal. MASTOIDS AND VISUALIZED PARANASAL SINUSES: No fluid levels or advanced mucosal thickening. No mastoid effusion. SKELETON: No bony spinal canal stenosis. No lytic or blastic lesions. UPPER CHEST: Please see report for dedicated chest CT. OTHER: None. IMPRESSION: No cervical lymphadenopathy.  Unremarkable CT of the neck. Electronically Signed  By: Ulyses Jarred M.D.   On: 07/30/2017 14:41   Ct Chest W Contrast  Result Date: 07/30/2017 CLINICAL DATA:  Restaging Hodgkin's lymphoma. EXAM: CT CHEST WITH CONTRAST TECHNIQUE: Multidetector CT imaging of the chest was performed during intravenous contrast administration. CONTRAST:  145mL ISOVUE-300 IOPAMIDOL (ISOVUE-300) INJECTION 61% COMPARISON:  PET-CT 01/26/2017 and  07/18/2016.  Chest CT 10/16/2015 FINDINGS: Cardiovascular: The heart is normal in size. No pericardial effusion. The aorta is normal in caliber. No dissection. Branch vessels are patent. No coronary artery calcifications. Mediastinum/Nodes: The calcified lymph node in the anterior mediastinum on the right side is slightly smaller measuring 19.5 x 13 mm and previously measuring 26.5 x 15.5 mm. No other measurable lymph nodes are identified. The esophagus is grossly normal. Lungs/Pleura: The lungs are clear. 7.5 mm right upper no nodule medially on image number 65 is stable. No new pulmonary lesions or acute pulmonary findings. No pleural or pericardial effusion. Upper Abdomen: No significant upper abdominal findings. Musculoskeletal: No significant bony findings. No discrete bone lesions. IMPRESSION: 1. Interval decrease in size of the calcified right anterior mediastinal lymph node. No new or progressive findings. 2. Stable 7.5 mm right upper lobe pulmonary nodule. No new or progressive findings. 3. No upper abdominal lymphadenopathy or liver or spleen lesions. Electronically Signed   By: Marijo Sanes M.D.   On: 07/30/2017 16:02   ASSESSMENT AND PLAN:  This is a very pleasant 22 years old African-American male with a stage III nodular sclerosing Hodgkin lymphoma with bulky lymphadenopathy in the right lower cervical, right supraclavicular, right axillary as well as mediastinal lymph nodes in addition to cavitary lung masses and splenic involvement diagnosed in March 2017. He is status post 6 cycles of systemic chemotherapy with ABVD. He was in observation for several months but was noncompliant with his follow-up visit.  Previous imaging studies showed evidence for disease recurrence involving left superior paratracheal lymph node as well as soft tissue mass. The patient underwent a curative radiotherapy under the care of Dr. Tammi Klippel completed in October 2018. The patient is currently on observation and he is  feeling fine.  Repeat CT scan of the neck and chest showed no concerning findings for disease recurrence. His lab work showed elevated liver enzymes as well as LDH of unclear etiology.  This could be secondary to his alcohol drinking and CBD oil.  I recommended for the patient to have repeat blood work in less than 2 weeks for reevaluation and I advised him to completely quit alcohol and using CBD oil for now. If he has normalization of his liver enzyme and LDH, I will see him back for follow-up visit in 6 months with repeat blood work otherwise I may consider him for further investigation and imaging studies of the abdomen. He was advised to call immediately if he has any concerning symptoms in the interval. The patient voices understanding of current disease status and treatment options and is in agreement with the current care plan.  All questions were answered. The patient knows to call the clinic with any problems, questions or concerns. We can certainly see the patient much sooner if necessary.  Disclaimer: This note was dictated with voice recognition software. Similar sounding words can inadvertently be transcribed and may not be corrected upon review.

## 2017-08-01 NOTE — Telephone Encounter (Signed)
Scheduled appt per 7/31 los - gave patient AVS and calender per los.  

## 2017-08-14 ENCOUNTER — Inpatient Hospital Stay: Payer: Self-pay | Attending: Internal Medicine

## 2017-08-14 DIAGNOSIS — C8118 Nodular sclerosis classical Hodgkin lymphoma, lymph nodes of multiple sites: Secondary | ICD-10-CM | POA: Insufficient documentation

## 2017-08-14 DIAGNOSIS — C8112 Nodular sclerosis classical Hodgkin lymphoma, intrathoracic lymph nodes: Secondary | ICD-10-CM

## 2017-08-14 LAB — CBC WITH DIFFERENTIAL (CANCER CENTER ONLY)
BASOS PCT: 1 %
Basophils Absolute: 0 10*3/uL (ref 0.0–0.1)
Eosinophils Absolute: 0 10*3/uL (ref 0.0–0.5)
Eosinophils Relative: 1 %
HCT: 42 % (ref 38.4–49.9)
Hemoglobin: 14.3 g/dL (ref 13.0–17.1)
LYMPHS ABS: 1.5 10*3/uL (ref 0.9–3.3)
Lymphocytes Relative: 42 %
MCH: 30.3 pg (ref 27.2–33.4)
MCHC: 34 g/dL (ref 32.0–36.0)
MCV: 89.2 fL (ref 79.3–98.0)
MONO ABS: 0.3 10*3/uL (ref 0.1–0.9)
MONOS PCT: 10 %
NEUTROS ABS: 1.7 10*3/uL (ref 1.5–6.5)
Neutrophils Relative %: 46 %
Platelet Count: 230 10*3/uL (ref 140–400)
RBC: 4.71 MIL/uL (ref 4.20–5.82)
RDW: 13.2 % (ref 11.0–14.6)
WBC Count: 3.6 10*3/uL — ABNORMAL LOW (ref 4.0–10.3)

## 2017-08-14 LAB — CMP (CANCER CENTER ONLY)
ALBUMIN: 4.2 g/dL (ref 3.5–5.0)
ALT: 22 U/L (ref 0–44)
AST: 22 U/L (ref 15–41)
Alkaline Phosphatase: 80 U/L (ref 38–126)
Anion gap: 10 (ref 5–15)
BUN: 9 mg/dL (ref 6–20)
CALCIUM: 9.4 mg/dL (ref 8.9–10.3)
CHLORIDE: 104 mmol/L (ref 98–111)
CO2: 24 mmol/L (ref 22–32)
CREATININE: 0.86 mg/dL (ref 0.61–1.24)
GFR, Estimated: >60 mL/min (ref >60)
GLUCOSE: 86 mg/dL (ref 70–99)
Potassium: 3.9 mmol/L (ref 3.5–5.1)
SODIUM: 138 mmol/L (ref 135–145)
Total Bilirubin: 0.7 mg/dL (ref 0.3–1.2)
Total Protein: 7.2 g/dL (ref 6.5–8.1)

## 2017-08-14 LAB — LACTATE DEHYDROGENASE: LDH: 176 U/L (ref 98–192)

## 2018-01-15 ENCOUNTER — Telehealth: Payer: Self-pay | Admitting: Internal Medicine

## 2018-01-15 ENCOUNTER — Encounter: Payer: Self-pay | Admitting: Internal Medicine

## 2018-01-15 ENCOUNTER — Inpatient Hospital Stay: Payer: Self-pay | Attending: Internal Medicine | Admitting: Internal Medicine

## 2018-01-15 ENCOUNTER — Inpatient Hospital Stay: Payer: Medicaid Other

## 2018-01-15 VITALS — BP 151/97 | HR 73 | Temp 97.5°F | Resp 18 | Ht 70.0 in | Wt 204.7 lb

## 2018-01-15 DIAGNOSIS — C8112 Nodular sclerosis classical Hodgkin lymphoma, intrathoracic lymph nodes: Secondary | ICD-10-CM

## 2018-01-15 DIAGNOSIS — C8118 Nodular sclerosis classical Hodgkin lymphoma, lymph nodes of multiple sites: Secondary | ICD-10-CM | POA: Insufficient documentation

## 2018-01-15 DIAGNOSIS — I10 Essential (primary) hypertension: Secondary | ICD-10-CM | POA: Insufficient documentation

## 2018-01-15 LAB — CMP (CANCER CENTER ONLY)
ALT: 18 U/L (ref 0–44)
AST: 24 U/L (ref 15–41)
Albumin: 4.2 g/dL (ref 3.5–5.0)
Alkaline Phosphatase: 71 U/L (ref 38–126)
Anion gap: 8 (ref 5–15)
BUN: 10 mg/dL (ref 6–20)
CHLORIDE: 108 mmol/L (ref 98–111)
CO2: 26 mmol/L (ref 22–32)
Calcium: 9.1 mg/dL (ref 8.9–10.3)
Creatinine: 0.91 mg/dL (ref 0.61–1.24)
Glucose, Bld: 93 mg/dL (ref 70–99)
POTASSIUM: 4.2 mmol/L (ref 3.5–5.1)
Sodium: 142 mmol/L (ref 135–145)
Total Bilirubin: 0.7 mg/dL (ref 0.3–1.2)
Total Protein: 7.1 g/dL (ref 6.5–8.1)

## 2018-01-15 LAB — CBC WITH DIFFERENTIAL (CANCER CENTER ONLY)
ABS IMMATURE GRANULOCYTES: 0.01 10*3/uL (ref 0.00–0.07)
BASOS PCT: 1 %
Basophils Absolute: 0 10*3/uL (ref 0.0–0.1)
EOS ABS: 0.1 10*3/uL (ref 0.0–0.5)
Eosinophils Relative: 1 %
HCT: 45.5 % (ref 39.0–52.0)
Hemoglobin: 15.8 g/dL (ref 13.0–17.0)
IMMATURE GRANULOCYTES: 0 %
Lymphocytes Relative: 47 %
Lymphs Abs: 1.8 10*3/uL (ref 0.7–4.0)
MCH: 30.4 pg (ref 26.0–34.0)
MCHC: 34.7 g/dL (ref 30.0–36.0)
MCV: 87.5 fL (ref 80.0–100.0)
MONO ABS: 0.4 10*3/uL (ref 0.1–1.0)
Monocytes Relative: 10 %
NEUTROS ABS: 1.5 10*3/uL — AB (ref 1.7–7.7)
NEUTROS PCT: 41 %
Platelet Count: 228 10*3/uL (ref 150–400)
RBC: 5.2 MIL/uL (ref 4.22–5.81)
RDW: 12.2 % (ref 11.5–15.5)
WBC: 3.8 10*3/uL — AB (ref 4.0–10.5)
nRBC: 0 % (ref 0.0–0.2)

## 2018-01-15 LAB — LACTATE DEHYDROGENASE: LDH: 187 U/L (ref 98–192)

## 2018-01-15 NOTE — Telephone Encounter (Signed)
Printed calendar and avs. °

## 2018-01-15 NOTE — Progress Notes (Signed)
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 Cytology was negative for lymphoma. 2) status post subxiphoid pericardial window under the care of Dr. Roxan Hockey on 04/29/2015. 3) Systemic chemotherapy with ABVD. First dose 04/20/2015. Status post 6 cycles.  4) curative radiotherapy to the recurrent mediastinal adenopathy under the care of Dr. Tammi Klippel completed October 09, 2016.  CURRENT THERAPY: Observation.  INTERVAL HISTORY: Jeffery Crane 23 y.o. male returns to the clinic today for follow-up visit.  The patient is feeling fine today with no concerning complaints except for being anxious about his condition and his lab.  He denied having any chest pain, shortness breath, cough or hemoptysis.  He has no recent weight loss or night sweats.  He has no nausea, vomiting, diarrhea or constipation.  He denied having any headache or visual changes.  The patient is here today for evaluation and repeat blood work.  MEDICAL HISTORY: Past Medical History:  Diagnosis Date  . Adult ADHD   . Anemia   . Chemotherapy induced neutropenia ([place]) 06/04/2015  . Encounter for antineoplastic chemotherapy 05/04/2015  . GERD (gastroesophageal reflux disease)   . Headache   . Nodular sclerosing Hodgkin's lymphoma ([place])   . 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 Pneumonia     ALLERGIES:  is allergic to amoxicillin-pot clavulanate and bactrim.  MEDICATIONS:  Current Outpatient Medications  Medication  Sig Dispense Refill  . HYDROcodone-acetaminophen (HYCET) 7.5-325 mg/15 ml solution Take 10 mLs by mouth 4 (four) times daily as needed for moderate pain. (Patient not taking: Reported on 12/08/2016) 473 mL 0  . Multiple Vitamins-Minerals (MULTIVITAMIN WITH MINERALS) tablet Take 1 tablet by mouth daily.     No current facility-administered medications for this visit.    Facility-Administered Medications Ordered in Other Visits  Medication Dose Route Frequency Provider Last Rate Last Dose  . sodium chloride flush (NS) 0.9 % injection 10 mL  10 mL Intracatheter PRN Curt Bears, MD   10 mL at 09/28/15 1654    SURGICAL HISTORY:  Past Surgical History:  Procedure Laterality Date  . CARDIAC CATHETERIZATION N/A 04/01/2015   Procedure: Pericardiocentesis;  Surgeon: Sherren Mocha, MD;  Location: [place] [facility];  Service: Cardiovascular;  Laterality: N/A;  . SCALENE NODE BIOPSY Right 03/15/2015   Procedure: BIOPSY SCALENE NODE;  Surgeon: [name], MD;  Location: [place];  Service: Thoracic;  Laterality: Right;  . SUBXYPHOID PERICARDIAL WINDOW  04/29/2015  . SUBXYPHOID PERICARDIAL WINDOW N/A 04/29/2015   Procedure: SUBXYPHOID PERICARDIAL WINDOW;  Surgeon: [name], MD;  Location: [place];  Service: Thoracic;  Laterality: N/A;  . SUPRACLAVICAL NODE BIOPSY Right 03/15/2015   Procedure: RIGHT SUPRACLAVICAL/CERVICAL LYMPH NODE BIOPSY;  Surgeon: [name], MD;  Location: [place];  Service: Thoracic;  Laterality: Right;  . TEE WITHOUT CARDIOVERSION N/A 04/29/2015   Procedure: TRANSESOPHAGEAL ECHOCARDIOGRAM (TEE);  Surgeon: [name], MD;  Location: [place];  Service: Thoracic;  Laterality: N/A;  . VIDEO BRONCHOSCOPY Bilateral 01/22/2015   Procedure: VIDEO BRONCHOSCOPY WITH FLUORO;  Surgeon: Marshell Garfinkel, MD;  [facility];  Service: Cardiopulmonary;  Laterality: Bilateral;    REVIEW OF SYSTEMS:  A comprehensive review of systems was negative.    PHYSICAL EXAMINATION: General appearance: alert, cooperative and no distress Head: Normocephalic, without obvious abnormality, atraumatic Neck: marked anterior cervical adenopathy and thyroid not enlarged, symmetric, no tenderness/mass/nodules Lymph nodes: Cervical, supraclavicular, and axillary nodes normal. Resp: clear to auscultation bilaterally Back: symmetric, no curvature. ROM normal. No CVA tenderness. Cardio: regular rate and rhythm, S1, S2 normal, no murmur, click, rub or gallop GI: soft, non-tender; bowel sounds normal; no masses,  no organomegaly Extremities: extremities normal, atraumatic, no cyanosis or edema   ECOG PERFORMANCE STATUS: 0 - Asymptomatic  Blood pressure (!) 151/97, pulse 73, temperature (!) 97.5 F (36.4 C), temperature source Oral, resp. rate 18, height 5\' 10"  (1.778 m), weight 204 lb 11.2 oz (92.9 kg), SpO2 98 %.  LABORATORY DATA: Lab Results  Component Value Date   WBC 3.8 (L) 01/15/2018   HGB 15.8 01/15/2018   HCT 45.5 01/15/2018   MCV 87.5 01/15/2018   PLT 228 01/15/2018      Chemistry      Component Value Date/Time   NA 138 08/14/2017 1305   NA 143 06/15/2016 1124   K 3.9 08/14/2017 1305   K 4.1 06/15/2016 1124   CL 104 08/14/2017 1305   CL 105 12/18/2013 0848   CO2 24 08/14/2017 1305   CO2 26 06/15/2016 1124   BUN 9 08/14/2017 1305   BUN 8.9 06/15/2016 1124   CREATININE 0.86 08/14/2017 1305   CREATININE 0.9 06/15/2016 1124      Component Value Date/Time   CALCIUM 9.4 08/14/2017 1305   CALCIUM 9.4 06/15/2016 1124   ALKPHOS 80 08/14/2017 1305   ALKPHOS 106 06/15/2016 1124   AST 22 08/14/2017 1305   AST 18 06/15/2016 1124   ALT 22 08/14/2017 1305   ALT 17 06/15/2016 1124   BILITOT 0.7 08/14/2017 1305   BILITOT 0.63 06/15/2016 1124       RADIOGRAPHIC STUDIES: No results found. ASSESSMENT AND PLAN:  This is a very pleasant 23 years old African-American male with a stage III nodular sclerosing Hodgkin lymphoma with bulky  lymphadenopathy in the right lower cervical, right supraclavicular, right axillary as well as mediastinal lymph nodes in addition to cavitary lung masses and splenic involvement diagnosed in March 2017. He is status post 6 cycles of systemic chemotherapy with ABVD. He was in observation for several months but was noncompliant with his follow-up visit.  Previous imaging studies showed evidence for disease recurrence involving left superior paratracheal lymph node as well as soft tissue mass. The patient underwent a curative radiotherapy under the care of Dr. Tammi Klippel completed in October 2018. He is currently on observation and feeling fine. CBC performed earlier today showed mild leukocytopenia but otherwise normal.  Comprehensive metabolic panel and LDH are still pending. I recommended for the patient to continue on observation with repeat CT scan of the neck and chest in 6 months. For hypertension he was advised to monitor his blood pressure closely at home.  The patient mentioned that he was very anxious today and that is why his blood pressure is high. He was advised to call immediately if he has any concerning symptoms in the interval. The patient voices understanding of current disease status and treatment options and is in agreement with the current care plan.  All questions were answered. The patient knows to call the clinic with any problems, questions or concerns. We  can certainly see the patient much sooner if necessary.  Disclaimer: This note was dictated with voice recognition software. Similar sounding words can inadvertently be transcribed and may not be corrected upon review.

## 2018-03-15 ENCOUNTER — Telehealth: Payer: Self-pay | Admitting: *Deleted

## 2018-03-15 NOTE — Telephone Encounter (Signed)
"  Jeffery Crane [phone]).  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   "Documentation must be submitted before classes begin in two weeks to qualify.  I'm working towards Architect process.  Additional health information is needed.Call me to pickup form."  Medical Examiner Request reads "Information of nature and severity of medical condition needed not to endanger health and safety of driver or the public." Form to provider folder for review.

## 2018-03-16 NOTE — Telephone Encounter (Signed)
Managed care to complete and I will be happy to sign. Thank you.

## 2018-03-18 ENCOUNTER — Telehealth: Payer: Self-pay | Admitting: Medical Oncology

## 2018-03-18 NOTE — Telephone Encounter (Signed)
Told pt to pick up form at front desk.

## 2018-03-20 ENCOUNTER — Encounter: Payer: Self-pay | Admitting: Medical Oncology

## 2018-03-20 ENCOUNTER — Telehealth: Payer: Self-pay | Admitting: Medical Oncology

## 2018-03-20 NOTE — Telephone Encounter (Signed)
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 Pt requests to transfer to another oncologist. Schedule message sent. I recommended pt contact [facility] for DOT Physicials.

## 2018-03-20 NOTE — Telephone Encounter (Signed)
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

## 2018-03-29 ENCOUNTER — Telehealth: Payer: Self-pay | Admitting: Hematology

## 2018-03-29 NOTE — Telephone Encounter (Signed)
Per staff msg from [place], change the pt's provider scheduled in July to Dr. Irene Limbo.

## 2018-04-03 ENCOUNTER — Encounter (HOSPITAL_COMMUNITY): Payer: Self-pay

## 2018-04-03 ENCOUNTER — Ambulatory Visit (HOSPITAL_COMMUNITY)
Admission: EM | Admit: 2018-04-03 | Discharge: 2018-04-03 | Disposition: A | Discharge status: Home / Self Care | Payer: Self-pay | Attending: Family Medicine | Admitting: Family Medicine

## 2018-04-03 ENCOUNTER — Other Ambulatory Visit: Payer: Self-pay

## 2018-04-03 DIAGNOSIS — B9789 Other viral agents as the cause of diseases classified elsewhere: Secondary | ICD-10-CM

## 2018-04-03 DIAGNOSIS — J069 Acute upper respiratory infection, unspecified: Secondary | ICD-10-CM

## 2018-04-03 NOTE — Discharge Instructions (Signed)
This is most likely a viral upper respiratory infection I am not concerned today for coronavirus I would like for you to take 3 days off work and if your symptoms are improved he can return to work after the third day Mucinex over-the-counter as needed for cough and congestion Tylenol or ibuprofen for fevers and body aches Follow up as needed for continued or worsening symptoms

## 2018-04-03 NOTE — ED Triage Notes (Signed)
Patient presents to Urgent Care with complaints of cough and fever since 2 days ago. Patient states highest recorded temp at home was 103.0, afebrile during triage. Pt states he took aspirin around 1100 in hopes of reducing fever.

## 2018-04-03 NOTE — ED Notes (Signed)
Patient verbalizes understanding of discharge instructions. Opportunity for questioning and answers were provided. Patient discharged from [facility] by provider.  

## 2018-04-03 NOTE — ED Provider Notes (Signed)
[place]    CSN: [id number] Arrival date & time: 04/03/18  1407     History   Chief Complaint Chief Complaint  Patient presents with  . Fever  . Cough    HPI Jeffery Crane is a 23 y.o. male.   Patient is a 23 year old male with past medical history of anemia, nodular sclerosing Hodgkin's lymphoma, community-acquired pneumonia, GERD, chemotherapy.  He presents today with approximately 2 days of cough, congestion, fever.  Reporting fever at home of 103.  He has been taking aspirin.  He is afebrile here today in clinic.  Denies any chest pain, shortness of breath.  He had a friend he has been around and has been sick.  He was tested for coronavirus approximate 3 weeks ago which test results were negative.  Denies any recent traveling.  He does not smoke.  ROS per HPI    Fever  Associated symptoms: cough   Cough  Associated symptoms: fever     Past Medical History:  Diagnosis Date  . Adult ADHD   . Anemia   . Chemotherapy induced neutropenia ([place]) 06/04/2015  . Encounter for antineoplastic chemotherapy 05/04/2015  . GERD (gastroesophageal reflux disease)   . Headache   . Nodular sclerosing Hodgkin's lymphoma ([place])   . 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 Pneumonia     Patient Active Problem List   Diagnosis Date Noted  . Liver dysfunction 08/01/2017  . Port catheter in place 06/15/2016  . Pneumonitis 10/17/2015  . Chemotherapy induced neutropenia ([place]) 06/04/2015  . 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 Encounter for antineoplastic chemotherapy 05/04/2015  . Pericardial effusion 03/29/2015  . 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 CAP (community acquired pneumonia)   . Cavitary lung disease   . Cavitary lesion of lung 01/19/2015  . Leukocytosis 01/19/2015  . Thrombocytosis ([place]) 01/19/2015  . Anemia 01/19/2015    Past Surgical History:  Procedure  Laterality Date  . CARDIAC CATHETERIZATION N/A 04/01/2015   Procedure: Pericardiocentesis;  Surgeon: Sherren Mocha, MD;  Location: [facility] [facility];  Service: Cardiovascular;  Laterality: N/A;  . SCALENE NODE BIOPSY Right 03/15/2015   Procedure: BIOPSY SCALENE NODE;  Surgeon: [name], MD;  Location: [facility];  Service: Thoracic;  Laterality: Right;  . SUBXYPHOID PERICARDIAL WINDOW  04/29/2015  . SUBXYPHOID PERICARDIAL WINDOW N/A 04/29/2015   Procedure: SUBXYPHOID PERICARDIAL WINDOW;  Surgeon: [name], MD;  Location: [facility] [place];  Service: Thoracic;  Laterality: N/A;  . SUPRACLAVICAL NODE BIOPSY Right 03/15/2015   Procedure: RIGHT SUPRACLAVICAL/CERVICAL LYMPH NODE BIOPSY;  Surgeon: [name], MD;  Location: [place];  Service: Thoracic;  Laterality: Right;  . TEE WITHOUT CARDIOVERSION N/A 04/29/2015   Procedure: TRANSESOPHAGEAL ECHOCARDIOGRAM (TEE);  Surgeon: [name], MD;  Location: [facility];  Service: Thoracic;  Laterality: N/A;  . VIDEO BRONCHOSCOPY Bilateral 01/22/2015   Procedure: VIDEO BRONCHOSCOPY WITH FLUORO;  Surgeon: Marshell Garfinkel, MD;  Location: [place];  Service: Cardiopulmonary;  Laterality: Bilateral;       Home Medications    Prior to Admission medications   Not on File    Family History Family History  Problem Relation Age of Onset  . Narcolepsy Mother   . Heart disease Maternal Grandfather   . Lung disease Maternal Grandfather        From smoking  . Cancer Neg Hx     Social History Social History  Tobacco Use  . Smoking status: Former Smoker    Last attempt to quit: 09/16/2015    Years since quitting: 2.5  . Smokeless tobacco: Never Used  . Tobacco comment: E cigarettes/Vaped.  Substance Use Topics  . Alcohol use: Yes    Comment: occasionally  . Drug use: Yes    Types: Marijuana     Allergies   Amoxicillin-pot clavulanate and Bactrim   Review of Systems Review of Systems  Constitutional: Positive for fever.   Respiratory: Positive for cough.      Physical Exam Triage Vital Signs ED Triage Vitals  Enc Vitals Group     BP 04/03/18 1430 123/83     Pulse Rate 04/03/18 1430 81     Resp 04/03/18 1430 17     Temp 04/03/18 1430 98.5 F (36.9 C)     Temp Source 04/03/18 1430 Oral     SpO2 04/03/18 1430 100 %     Weight --      Height --      Head Circumference --      Peak Flow --      Pain Score 04/03/18 1431 0     Pain Loc --      Pain Edu? --      Excl. in [place]? --    No data found.  Updated Vital Signs BP 123/83 (BP Location: Left Arm)   Pulse 81   Temp 98.5 F (36.9 C) (Oral)   Resp 17   SpO2 100%   Visual Acuity Right Eye Distance:   Left Eye Distance:   Bilateral Distance:    Right Eye Near:   Left Eye Near:    Bilateral Near:     Physical Exam Vitals signs and nursing note reviewed.  Constitutional:      General: He is not in acute distress.    Appearance: Normal appearance. He is well-developed. He is not ill-appearing, toxic-appearing or diaphoretic.  HENT:     Head: Normocephalic and atraumatic.     Right Ear: Tympanic membrane and ear canal normal.     Left Ear: Tympanic membrane and ear canal normal.     Mouth/Throat:     Mouth: Mucous membranes are moist.     Pharynx: Oropharynx is clear.  Eyes:     Conjunctiva/sclera: Conjunctivae normal.  Neck:     Musculoskeletal: Normal range of motion and neck supple.  Cardiovascular:     Rate and Rhythm: Normal rate and regular rhythm.     Heart sounds: No murmur.  Pulmonary:     Effort: Pulmonary effort is normal. No respiratory distress.     Breath sounds: Normal breath sounds.  Musculoskeletal: Normal range of motion.  Lymphadenopathy:     Cervical: No cervical adenopathy.  Skin:    General: Skin is warm and dry.  Neurological:     Mental Status: He is alert.  Psychiatric:        Mood and Affect: Mood normal.      UC Treatments / Results  Labs (all labs ordered are listed, but only abnormal  results are displayed) Labs Reviewed - No data to display  EKG None  Radiology No results found.  Procedures Procedures (including critical care time)  Medications Ordered in UC Medications - No data to display  Initial Impression / Assessment and Plan / UC Course  I have reviewed the triage vital signs and the nursing notes.  Pertinent labs & imaging results that were available during my care of  the patient were reviewed by me and considered in my medical decision making (see chart for details).      This is most likely a viral illness No concern for coronavirus at this time He can take over-the-counter medication as needed for symptoms Work note given for 3 days Instructed to call on day 3 if he still having symptoms and needs an extension otherwise he can go back to work Follow up as needed for continued or worsening symptoms  Final Clinical Impressions(s) / UC Diagnoses   Final diagnoses:  Viral URI with cough     Discharge Instructions     This is most likely a viral upper respiratory infection I am not concerned today for coronavirus I would like for you to take 3 days off work and if your symptoms are improved he can return to work after the third day Mucinex over-the-counter as needed for cough and congestion Tylenol or ibuprofen for fevers and body aches Follow up as needed for continued or worsening symptoms     ED Prescriptions    None     Controlled Substance Prescriptions [place] Controlled Substance Registry consulted? Not Applicable   Orvan July, NP 04/03/18 1530

## 2018-04-09 ENCOUNTER — Telehealth: Payer: Self-pay | Admitting: Internal Medicine

## 2018-04-09 NOTE — Telephone Encounter (Signed)
Pt cld to change appt in July back to Dr. Julien Nordmann from Dr. Irene Limbo. Ok per staff msg from Dr. Julien Nordmann as long as the pt hasn't seen another provider. Pt has been cld and made aware that his appt in July will be with Dr. Julien Nordmann instead of Dr. Irene Limbo.

## 2018-06-26 ENCOUNTER — Telehealth: Payer: Self-pay | Admitting: Medical Oncology

## 2018-06-26 NOTE — Telephone Encounter (Signed)
He rescheduled his appointment to August 3rd.

## 2018-06-26 NOTE — Telephone Encounter (Signed)
wants to change his appts for CT scan and f/u. Schedule message sent and cc radiology scheduler.

## 2018-06-27 ENCOUNTER — Telehealth: Payer: Self-pay | Admitting: Internal Medicine

## 2018-06-27 NOTE — Telephone Encounter (Signed)
R/s appt per 6/24 sch message - pt is aware of new appt date and time

## 2018-07-12 ENCOUNTER — Other Ambulatory Visit: Payer: Medicaid Other

## 2018-07-12 ENCOUNTER — Ambulatory Visit (HOSPITAL_COMMUNITY): Payer: Self-pay

## 2018-07-16 ENCOUNTER — Ambulatory Visit: Payer: Self-pay | Admitting: Internal Medicine

## 2018-07-19 ENCOUNTER — Telehealth: Payer: Self-pay | Admitting: Internal Medicine

## 2018-07-19 NOTE — Telephone Encounter (Signed)
Patient walked in to push his CT and f/u earlier due to a schedule change. Called and rescheduled Ct and moved MD appt to 2 weeks earlier.

## 2018-07-24 ENCOUNTER — Other Ambulatory Visit: Payer: Self-pay

## 2018-07-24 ENCOUNTER — Ambulatory Visit (HOSPITAL_COMMUNITY)
Admission: RE | Admit: 2018-07-24 | Discharge: 2018-07-24 | Disposition: A | Discharge status: Home / Self Care | Payer: Self-pay | Source: Ambulatory Visit | Attending: Internal Medicine | Admitting: Internal Medicine

## 2018-07-24 ENCOUNTER — Inpatient Hospital Stay: Payer: Medicaid Other | Attending: Internal Medicine

## 2018-07-24 DIAGNOSIS — C8118 Nodular sclerosis classical Hodgkin lymphoma, lymph nodes of multiple sites: Secondary | ICD-10-CM | POA: Insufficient documentation

## 2018-07-24 DIAGNOSIS — C8112 Nodular sclerosis classical Hodgkin lymphoma, intrathoracic lymph nodes: Secondary | ICD-10-CM

## 2018-07-24 DIAGNOSIS — Z9119 Patient's noncompliance with other medical treatment and regimen: Secondary | ICD-10-CM | POA: Insufficient documentation

## 2018-07-24 LAB — CMP (CANCER CENTER ONLY)
ALT: 18 U/L (ref 0–44)
AST: 28 U/L (ref 15–41)
Albumin: 3.9 g/dL (ref 3.5–5.0)
Alkaline Phosphatase: 70 U/L (ref 38–126)
Anion gap: 10 (ref 5–15)
BUN: 9 mg/dL (ref 6–20)
CO2: 24 mmol/L (ref 22–32)
Calcium: 9.3 mg/dL (ref 8.9–10.3)
Chloride: 107 mmol/L (ref 98–111)
Creatinine: 0.92 mg/dL (ref 0.61–1.24)
GFR, Est AFR Am: >60 mL/min (ref >60)
GFR, Estimated: >60 mL/min (ref >60)
Glucose, Bld: 105 mg/dL — ABNORMAL HIGH (ref 70–99)
Potassium: 4.2 mmol/L (ref 3.5–5.1)
Sodium: 141 mmol/L (ref 135–145)
Total Bilirubin: 0.7 mg/dL (ref 0.3–1.2)
Total Protein: 7.1 g/dL (ref 6.5–8.1)

## 2018-07-24 LAB — CBC WITH DIFFERENTIAL (CANCER CENTER ONLY)
Abs Immature Granulocytes: 0.01 10*3/uL (ref 0.00–0.07)
Basophils Absolute: 0.1 10*3/uL (ref 0.0–0.1)
Basophils Relative: 1 %
Eosinophils Absolute: 0 10*3/uL (ref 0.0–0.5)
Eosinophils Relative: 1 %
HCT: 45.4 % (ref 39.0–52.0)
Hemoglobin: 15.5 g/dL (ref 13.0–17.0)
Immature Granulocytes: 0 %
Lymphocytes Relative: 49 %
Lymphs Abs: 1.7 10*3/uL (ref 0.7–4.0)
MCH: 30.5 pg (ref 26.0–34.0)
MCHC: 34.1 g/dL (ref 30.0–36.0)
MCV: 89.2 fL (ref 80.0–100.0)
Monocytes Absolute: 0.3 10*3/uL (ref 0.1–1.0)
Monocytes Relative: 9 %
Neutro Abs: 1.4 10*3/uL — ABNORMAL LOW (ref 1.7–7.7)
Neutrophils Relative %: 40 %
Platelet Count: 207 10*3/uL (ref 150–400)
RBC: 5.09 MIL/uL (ref 4.22–5.81)
RDW: 12.2 % (ref 11.5–15.5)
WBC Count: 3.5 10*3/uL — ABNORMAL LOW (ref 4.0–10.5)
nRBC: 0 % (ref 0.0–0.2)

## 2018-07-24 LAB — LACTATE DEHYDROGENASE: LDH: 144 U/L (ref 98–192)

## 2018-07-24 MED ORDER — IOHEXOL 300 MG/ML  SOLN
75.0000 mL | Freq: Once | INTRAMUSCULAR | Status: AC | PRN
  Administered 2018-07-24: 75 mL via INTRAVENOUS

## 2018-07-24 MED ORDER — SODIUM CHLORIDE (PF) 0.9 % IJ SOLN
INTRAMUSCULAR | Status: AC
  Filled 2018-07-24: qty 50

## 2018-07-25 ENCOUNTER — Encounter: Payer: Self-pay | Admitting: Physician Assistant

## 2018-07-25 ENCOUNTER — Other Ambulatory Visit: Payer: Self-pay

## 2018-07-25 ENCOUNTER — Telehealth: Payer: Self-pay | Admitting: Physician Assistant

## 2018-07-25 ENCOUNTER — Inpatient Hospital Stay (HOSPITAL_BASED_OUTPATIENT_CLINIC_OR_DEPARTMENT_OTHER): Payer: Self-pay | Admitting: Physician Assistant

## 2018-07-25 VITALS — BP 132/83 | HR 96 | Temp 98.9°F | Resp 16 | Ht 70.0 in | Wt 204.6 lb

## 2018-07-25 DIAGNOSIS — C8118 Nodular sclerosis classical Hodgkin lymphoma, lymph nodes of multiple sites: Secondary | ICD-10-CM

## 2018-07-25 DIAGNOSIS — Z95828 Presence of other vascular implants and grafts: Secondary | ICD-10-CM

## 2018-07-25 DIAGNOSIS — Z9119 Patient's noncompliance with other medical treatment and regimen: Secondary | ICD-10-CM

## 2018-07-25 DIAGNOSIS — C8112 Nodular sclerosis classical Hodgkin lymphoma, intrathoracic lymph nodes: Secondary | ICD-10-CM

## 2018-07-25 NOTE — Telephone Encounter (Signed)
Scheduled per los. Patient declined printout  

## 2018-07-25 NOTE — Progress Notes (Signed)
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 Cytology was negative for lymphoma. 2) status post subxiphoid pericardial window under the care of Dr. Roxan Hockey on 04/29/2015. 3) Systemic chemotherapy with ABVD. First dose 04/20/2015. Status post 6 cycles.  4) curative radiotherapy to the recurrent mediastinal adenopathy under the care of Dr. Tammi Klippel completed October 09, 2016.  CURRENT THERAPY: Observation  INTERVAL HISTORY: Jeffery Crane 23 y.o. male returns to the clinic for a follow up visit. The patient is feeling well today without any concerning complaints. He states he is trying to get in shape and lose weight. He started exercising recently. He states that he is concerned about having his port-a-cath when he exercises and inquired about getting it removed. Otherwise, the patient is feeling well. He denies any fevers, chills, night sweats, lymphadenopathy or weight loss. He denies any chest pain, shortness of breath, cough, or hemoptysis. He denies any nausea, vomiting, diarrhea, constipation or abdominal pain/fullness. He denies any bleeding or bruising. He denies any recent infections. He denies any headaches or visual changes. The patient recently had a restaging CT scan performed. He is here today for evaluation and to review his scan results.    MEDICAL HISTORY: Past Medical History:  Diagnosis Date  . Adult ADHD   . Anemia   . Chemotherapy induced neutropenia ([place]) 06/04/2015  . Encounter for antineoplastic chemotherapy 05/04/2015  . GERD (gastroesophageal reflux disease)   . Headache   . Nodular sclerosing  Hodgkin's lymphoma ([place])   . 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 Pneumonia     ALLERGIES:  is allergic to amoxicillin-pot clavulanate and bactrim.  MEDICATIONS:  No current outpatient medications on file.   No current facility-administered medications for this visit.    Facility-Administered Medications Ordered in Other Visits  Medication Dose Route Frequency Provider Last Rate Last Dose  . sodium chloride flush (NS) 0.9 % injection 10 mL  10 mL Intracatheter PRN Curt Bears, MD   10 mL at 09/28/15 1654    SURGICAL HISTORY:  Past Surgical History:  Procedure Laterality Date  . CARDIAC CATHETERIZATION N/A 04/01/2015   Procedure: Pericardiocentesis;  Surgeon: Sherren Mocha, MD;  Location: [facility];  Service: Cardiovascular;  Laterality: N/A;  . SCALENE NODE BIOPSY Right 03/15/2015   Procedure: BIOPSY SCALENE NODE;  Surgeon: [name], MD;  Location: [facility];  Service: Thoracic;  Laterality: Right;  . SUBXYPHOID PERICARDIAL WINDOW  04/29/2015  . SUBXYPHOID PERICARDIAL WINDOW N/A 04/29/2015   Procedure: SUBXYPHOID PERICARDIAL WINDOW;  Surgeon: [name], MD;  Location: [facility];  Service: Thoracic;  Laterality: N/A;  . SUPRACLAVICAL NODE BIOPSY Right 03/15/2015   Procedure: RIGHT SUPRACLAVICAL/CERVICAL LYMPH NODE BIOPSY;  Surgeon: [name], MD;  Location: [facility];  Service: Thoracic;  Laterality: Right;  . TEE WITHOUT CARDIOVERSION N/A 04/29/2015   Procedure: TRANSESOPHAGEAL ECHOCARDIOGRAM (TEE);  Surgeon: [name], MD;  Location: [facility];  Service: Thoracic;  Laterality: N/A;  . VIDEO BRONCHOSCOPY Bilateral 01/22/2015   Procedure: VIDEO BRONCHOSCOPY WITH FLUORO;  Surgeon: Marshell Garfinkel, MD;  Location: [facility];  Service: Cardiopulmonary;  Laterality: Bilateral;    REVIEW  OF SYSTEMS:   Review of Systems  Constitutional: Negative for appetite change, chills, fatigue, fever and unexpected  weight change.  HENT:   Negative for mouth sores, nosebleeds, sore throat and trouble swallowing.   Eyes: Negative for eye problems and icterus.  Respiratory: Negative for cough, hemoptysis, shortness of breath and wheezing.   Cardiovascular: Negative for chest pain and leg swelling.  Gastrointestinal: Negative for abdominal pain, constipation, diarrhea, nausea and vomiting.  Genitourinary: Negative for bladder incontinence, difficulty urinating, dysuria, frequency and hematuria.   Musculoskeletal: Negative for back pain, gait problem, neck pain and neck stiffness.  Skin: Negative for itching and rash.  Neurological: Negative for dizziness, extremity weakness, gait problem, headaches, light-headedness and seizures.  Hematological: Negative for adenopathy. Does not bruise/bleed easily.  Psychiatric/Behavioral: Negative for confusion, depression and sleep disturbance. The patient is not nervous/anxious.     PHYSICAL EXAMINATION:  Blood pressure 132/83, pulse 96, temperature 98.9 F (37.2 C), temperature source Temporal, resp. rate 16, height 5\' 10"  (1.778 m), weight 204 lb 9.6 oz (92.8 kg), SpO2 100 %.  ECOG PERFORMANCE STATUS: 0 - Asymptomatic  Physical Exam  Constitutional: Oriented to person, place, and time and well-developed, well-nourished, and in no distress.  HENT:  Head: Normocephalic and atraumatic.  Mouth/Throat: Oropharynx is clear and moist. No oropharyngeal exudate.  Eyes: Conjunctivae are normal. Right eye exhibits no discharge. Left eye exhibits no discharge. No scleral icterus.  Neck: Normal range of motion. Neck supple.  Cardiovascular: Normal rate, regular rhythm, normal heart sounds and intact distal pulses.   Pulmonary/Chest: Effort normal and breath sounds normal. No respiratory distress. No wheezes. No rales.  Abdominal: Soft. Bowel sounds are normal. Exhibits no distension and no mass. There is no tenderness.  Musculoskeletal: Normal range of motion. Exhibits no  edema.  Lymphadenopathy:    No cervical adenopathy.  Neurological: Alert and oriented to person, place, and time. Exhibits normal muscle tone. Gait normal. Coordination normal.  Skin: Skin is warm and dry. No rash noted. Not diaphoretic. No erythema. No pallor.  Psychiatric: Mood, memory and judgment normal.  Vitals reviewed.  LABORATORY DATA: Lab Results  Component Value Date   WBC 3.5 (L) 07/24/2018   HGB 15.5 07/24/2018   HCT 45.4 07/24/2018   MCV 89.2 07/24/2018   PLT 207 07/24/2018      Chemistry      Component Value Date/Time   NA 141 07/24/2018 0916   NA 143 06/15/2016 1124   K 4.2 07/24/2018 0916   K 4.1 06/15/2016 1124   CL 107 07/24/2018 0916   CL 105 12/18/2013 0848   CO2 24 07/24/2018 0916   CO2 26 06/15/2016 1124   BUN 9 07/24/2018 0916   BUN 8.9 06/15/2016 1124   CREATININE 0.92 07/24/2018 0916   CREATININE 0.9 06/15/2016 1124      Component Value Date/Time   CALCIUM 9.3 07/24/2018 0916   CALCIUM 9.4 06/15/2016 1124   ALKPHOS 70 07/24/2018 0916   ALKPHOS 106 06/15/2016 1124   AST 28 07/24/2018 0916   AST 18 06/15/2016 1124   ALT 18 07/24/2018 0916   ALT 17 06/15/2016 1124   BILITOT 0.7 07/24/2018 0916   BILITOT 0.63 06/15/2016 1124       RADIOGRAPHIC STUDIES:  Ct Soft Tissue Neck W Contrast  Result Date: 07/25/2018 CLINICAL DATA:  Lymphoma, Hodgkin's diagnosed in 2017 with completed chemo and radiotherapy EXAM: CT NECK WITH CONTRAST TECHNIQUE: Multidetector CT imaging of the neck was performed using the standard protocol following the  bolus administration of intravenous contrast. CONTRAST:  42mL OMNIPAQUE IOHEXOL 300 MG/ML  SOLN COMPARISON:  07/30/2017 FINDINGS: Pharynx and larynx: No asymmetric or new thickening of Waldeyer's ring. Salivary glands: No inflammation, mass, or stone. Thyroid: Normal. Lymph nodes: None enlarged or abnormal density. Vascular: Negative. Limited intracranial: Negative. Visualized orbits: Negative. Mastoids and visualized  paranasal sinuses: Small retention cyst in the right maxillary sinus, stable. Skeleton: No acute or aggressive process. Upper chest: Reported separately IMPRESSION: Negative neck CT.  No evidence of disease. Electronically Signed   By: Monte Fantasia M.D.   On: 07/25/2018 04:30   Ct Chest W Contrast  Result Date: 07/24/2018 CLINICAL DATA:  History of non-Hodgkin's lymphoma diagnosed in 2017. Chemotherapy and radiation therapy complete. No current complaints. EXAM: CT CHEST WITH CONTRAST TECHNIQUE: Multidetector CT imaging of the chest was performed during intravenous contrast administration. CONTRAST:  2mL OMNIPAQUE IOHEXOL 300 MG/ML  SOLN COMPARISON:  07/30/2017 FINDINGS: Cardiovascular: Right Port-A-Cath tip at superior caval/atrial junction. Normal caliber of the aorta and branch vessels. Normal heart size, without pericardial effusion. No central pulmonary embolism, on this non-dedicated study. Mediastinum/Nodes: No middle mediastinal or hilar adenopathy. Anterior mediastinal partially calcified lesion measures 1.7 x 1.2 cm on 25/4. Compare 2.0 x 1.3 cm on the prior. Lungs/Pleura: No pleural fluid. Minimal left lower lobe scarring or subsegmental atelectasis. The anteromedial right upper lobe 7 mm pulmonary nodule is similar on 69/7. Upper Abdomen: Normal imaged portions of the liver, spleen, stomach, pancreas, adrenal glands, gallbladder, kidneys. Musculoskeletal: No acute osseous abnormality. IMPRESSION: 1. Further decrease in size of a partially calcified anterior mediastinal lesion. 2. No new or progressive disease. 3. Similar 7 mm right upper lobe pulmonary nodule. Electronically Signed   By: Abigail Miyamoto M.D.   On: 07/24/2018 16:23     ASSESSMENT/PLAN:  This is a very pleasant 23 year old African-American male with stage IIIa nodular sclerosing Hodgkin's lymphoma with bulky lymphadenopathy in the right lower cervical, right supraclavicular, right axillary as well as mediastinal lymphadenopathy.  He also presented with a cavitary lung mass and splenic involvement. He was diagnosed in March of 2017. He is status post 6 cycles of systemic chemotherapy with ABVD.   He was on observation for several months. He was non-compliant with his follow up visits. Imaging studies showed evidence of disease recurrence involving the left superior paratracheal lymph nodes as well as a soft tissue mass. He then underwent curative radiotherapy under the care of Dr. Tammi Klippel which was completed in October of 2018.   He recently had a restaging CT scan performed. Dr. Julien Nordmann personally and independently reviewed the scan and discussed the results with the patient today. The scan did not show any evidence of disease recurrence.   I will arrange for the patient to obtain a restaging CT scan of the neck and chest in 1 year.   We will see the patient back for a follow up visit in 1 year for evaluation and to review his scan results.   The patient is requesting removal of his port-a-cath. We discussed the risks and benefits of removing his port-a-cath, should he need it in the future. The patient is interested in having it removed. I will send a referral to interventional radiology to remove his port-a-cath.   The patient was advised to call immediately if he has any concerning symptoms in the interval. The patient voices understanding of current disease status and treatment options and is in agreement with the current care plan. All questions were answered. The  patient knows to call the clinic with any problems, questions or concerns. We can certainly see the patient much sooner if necessary   Orders Placed This Encounter  Procedures  . IR Removal Tun Access W/ Port W/O FL    Standing Status:   Future    Standing Expiration Date:   09/25/2019    Order Specific Question:   Reason for exam:    Answer:   Removal of port-a-cath on the right side of the chest    Order Specific Question:   Preferred Imaging Location?     Answer:   [facility]  . CT Chest W Contrast    Standing Status:   Future    Standing Expiration Date:   07/25/2019    Order Specific Question:   ** REASON FOR EXAM (FREE TEXT)    Answer:   Restaging with history of lymphoma    Order Specific Question:   If indicated for the ordered procedure, I authorize the administration of contrast media per Radiology protocol    Answer:   Yes    Order Specific Question:   Preferred imaging location?    Answer:   [facility]    Order Specific Question:   Radiology Contrast Protocol - do NOT remove file path    Answer:   \\[email or web address]  . CT Soft Tissue Neck W Contrast    Standing Status:   Future    Standing Expiration Date:   07/25/2019    Order Specific Question:   ** REASON FOR EXAM (FREE TEXT)    Answer:   Restaging Lymphoma    Order Specific Question:   If indicated for the ordered procedure, I authorize the administration of contrast media per Radiology protocol    Answer:   Yes    Order Specific Question:   Preferred imaging location?    Answer:   [facility]    Order Specific Question:   Radiology Contrast Protocol - do NOT remove file path    Answer:   \\[email or web address]  . CMP ([place] only)    Standing Status:   Future    Standing Expiration Date:   07/25/2019  . CBC with Differential (Cancer Center Only)    Standing Status:   Future    Standing Expiration Date:   07/25/2019  . Lactate dehydrogenase (LDH)    Standing Status:   Future    Standing Expiration Date:   07/25/2019     Tobe Sos Heilingoetter, PA-C 07/25/18  ADDENDUM: Hematology/Oncology Attending: I had a face-to-face encounter with the patient today.  I recommended his care plan.  This is a very pleasant 23 years old African-American male with history of stage IIIa nodular sclerosing Hodgkin lymphoma diagnosed in March 2017 status post treatment with ABVD for 6 cycles.  He had  evidence for disease recurrence in the left superior paratracheal lymph node and the patient underwent curative radiotherapy. He is feeling fine today with no concerning complaints. He had repeat CT scan of the neck and chest performed recently.  I personally and independently reviewed the scans and discussed the results with the patient today. His scan showed no concerning findings for disease recurrence or progression. I recommended for him to continue on observation with repeat CT scan of the neck and chest in 1 year. He requested removal of the Port-A-Cath and we will send him to interventional urology for this procedure. The patient was advised to call immediately if he has any concerning symptoms in  the interval.  Disclaimer: This note was dictated with voice recognition software. Similar sounding words can inadvertently be transcribed and may be missed upon review. Eilleen Kempf, MD 07/25/18

## 2018-08-05 ENCOUNTER — Ambulatory Visit (HOSPITAL_COMMUNITY): Payer: Self-pay

## 2018-08-05 ENCOUNTER — Other Ambulatory Visit: Payer: Self-pay

## 2018-08-08 ENCOUNTER — Ambulatory Visit: Payer: Self-pay | Admitting: Internal Medicine

## 2018-08-21 ENCOUNTER — Other Ambulatory Visit: Payer: Self-pay | Admitting: Radiology

## 2018-08-22 ENCOUNTER — Other Ambulatory Visit: Payer: Self-pay

## 2018-08-22 ENCOUNTER — Ambulatory Visit (HOSPITAL_COMMUNITY)
Admission: RE | Admit: 2018-08-22 | Discharge: 2018-08-22 | Disposition: A | Discharge status: Home / Self Care | Payer: Medicaid Other | Source: Ambulatory Visit | Attending: Physician Assistant | Admitting: Physician Assistant

## 2018-08-22 ENCOUNTER — Encounter (HOSPITAL_COMMUNITY): Payer: Self-pay

## 2018-08-22 DIAGNOSIS — C8112 Nodular sclerosis classical Hodgkin lymphoma, intrathoracic lymph nodes: Secondary | ICD-10-CM

## 2018-08-22 DIAGNOSIS — Z9221 Personal history of antineoplastic chemotherapy: Secondary | ICD-10-CM | POA: Insufficient documentation

## 2018-08-22 DIAGNOSIS — F909 Attention-deficit hyperactivity disorder, unspecified type: Secondary | ICD-10-CM | POA: Insufficient documentation

## 2018-08-22 DIAGNOSIS — Z923 Personal history of irradiation: Secondary | ICD-10-CM | POA: Insufficient documentation

## 2018-08-22 DIAGNOSIS — Z87891 Personal history of nicotine dependence: Secondary | ICD-10-CM | POA: Insufficient documentation

## 2018-08-22 DIAGNOSIS — K219 Gastro-esophageal reflux disease without esophagitis: Secondary | ICD-10-CM | POA: Insufficient documentation

## 2018-08-22 HISTORY — PX: IR REMOVAL TUN ACCESS W/ PORT W/O FL MOD SED: IMG2290

## 2018-08-22 LAB — CBC WITH DIFFERENTIAL/PLATELET
Abs Immature Granulocytes: 0.01 10*3/uL (ref 0.00–0.07)
Basophils Absolute: 0 10*3/uL (ref 0.0–0.1)
Basophils Relative: 1 %
Eosinophils Absolute: 0.1 10*3/uL (ref 0.0–0.5)
Eosinophils Relative: 1 %
HCT: 46.4 % (ref 39.0–52.0)
Hemoglobin: 15.9 g/dL (ref 13.0–17.0)
Immature Granulocytes: 0 %
Lymphocytes Relative: 47 %
Lymphs Abs: 1.7 10*3/uL (ref 0.7–4.0)
MCH: 31.1 pg (ref 26.0–34.0)
MCHC: 34.3 g/dL (ref 30.0–36.0)
MCV: 90.6 fL (ref 80.0–100.0)
Monocytes Absolute: 0.3 10*3/uL (ref 0.1–1.0)
Monocytes Relative: 8 %
Neutro Abs: 1.6 10*3/uL — ABNORMAL LOW (ref 1.7–7.7)
Neutrophils Relative %: 43 %
Platelets: 214 10*3/uL (ref 150–400)
RBC: 5.12 MIL/uL (ref 4.22–5.81)
RDW: 12 % (ref 11.5–15.5)
WBC: 3.8 10*3/uL — ABNORMAL LOW (ref 4.0–10.5)
nRBC: 0 % (ref 0.0–0.2)

## 2018-08-22 LAB — PROTIME-INR
INR: 0.9 (ref 0.8–1.2)
Prothrombin Time: 12.3 seconds (ref 11.4–15.2)

## 2018-08-22 MED ORDER — LIDOCAINE HCL 1 % IJ SOLN
INTRAMUSCULAR | Status: AC
  Filled 2018-08-22: qty 20

## 2018-08-22 MED ORDER — CLINDAMYCIN PHOSPHATE 900 MG/50ML IV SOLN
900.0000 mg | Freq: Once | INTRAVENOUS | Status: AC
  Administered 2018-08-22: 900 mg via INTRAVENOUS

## 2018-08-22 MED ORDER — FENTANYL CITRATE (PF) 100 MCG/2ML IJ SOLN
INTRAMUSCULAR | Status: AC
  Filled 2018-08-22: qty 4

## 2018-08-22 MED ORDER — MIDAZOLAM HCL 2 MG/2ML IJ SOLN
INTRAMUSCULAR | Status: AC | PRN
  Administered 2018-08-22: 1 mg via INTRAVENOUS
  Administered 2018-08-22: 2 mg via INTRAVENOUS
  Administered 2018-08-22: 1 mg via INTRAVENOUS

## 2018-08-22 MED ORDER — FENTANYL CITRATE (PF) 100 MCG/2ML IJ SOLN
INTRAMUSCULAR | Status: AC | PRN
  Administered 2018-08-22 (×2): 50 ug via INTRAVENOUS

## 2018-08-22 MED ORDER — LIDOCAINE HCL (PF) 1 % IJ SOLN
INTRAMUSCULAR | Status: AC | PRN
  Administered 2018-08-22: 10 mL

## 2018-08-22 MED ORDER — CLINDAMYCIN PHOSPHATE 900 MG/50ML IV SOLN
INTRAVENOUS | Status: AC
  Administered 2018-08-22: 14:00:00 900 mg via INTRAVENOUS
  Filled 2018-08-22: qty 50

## 2018-08-22 MED ORDER — SODIUM CHLORIDE 0.9 % IV SOLN
INTRAVENOUS | Status: DC
  Administered 2018-08-22: 13:00:00 via INTRAVENOUS

## 2018-08-22 MED ORDER — MIDAZOLAM HCL 2 MG/2ML IJ SOLN
INTRAMUSCULAR | Status: AC
  Filled 2018-08-22: qty 4

## 2018-08-22 NOTE — Discharge Instructions (Signed)
Implanted Port Removal, Care After °This sheet gives you information about how to care for yourself after your procedure. Your health care provider may also give you more specific instructions. If you have problems or questions, contact your health care provider. °What can I expect after the procedure? °After the procedure, it is common to have: °· Soreness or pain near your incision. °· Some swelling or bruising near your incision. °Follow these instructions at home: °Medicines °· Take over-the-counter and prescription medicines only as told by your health care provider. °· If you were prescribed an antibiotic medicine, take it as told by your health care provider. Do not stop taking the antibiotic even if you start to feel better. °Bathing °· Do not take baths, swim, or use a hot tub until your health care provider approves. Ask your health care provider if you can take showers. You may only be allowed to take sponge baths. °Incision care ° °· Follow instructions from your health care provider about how to take care of your incision. Make sure you: °? Wash your hands with soap and water before you change your bandage (dressing). If soap and water are not available, use hand sanitizer. °? Change your dressing as told by your health care provider. °? Keep your dressing dry. °? Leave stitches (sutures), skin glue, or adhesive strips in place. These skin closures may need to stay in place for 2 weeks or longer. If adhesive strip edges start to loosen and curl up, you may trim the loose edges. Do not remove adhesive strips completely unless your health care provider tells you to do that. °· Check your incision area every day for signs of infection. Check for: °? More redness, swelling, or pain. °? More fluid or blood. °? Warmth. °? Pus or a bad smell. °Driving ° °· Do not drive for 24 hours if you were given a medicine to help you relax (sedative) during your procedure. °· If you did not receive a sedative, ask your  health care provider when it is safe to drive. °Activity °· Return to your normal activities as told by your health care provider. Ask your health care provider what activities are safe for you. °· Do not lift anything that is heavier than 10 lb (4.5 kg), or the limit that you are told, until your health care provider says that it is safe. °· Do not do activities that involve lifting your arms over your head. °General instructions °· Do not use any products that contain nicotine or tobacco, such as cigarettes and e-cigarettes. These can delay healing. If you need help quitting, ask your health care provider. °· Keep all follow-up visits as told by your health care provider. This is important. °Contact a health care provider if: °· You have more redness, swelling, or pain around your incision. °· You have more fluid or blood coming from your incision. °· Your incision feels warm to the touch. °· You have pus or a bad smell coming from your incision. °· You have pain that is not relieved by your pain medicine. °Get help right away if you have: °· A fever or chills. °· Chest pain. °· Difficulty breathing. °Summary °· After the procedure, it is common to have pain, soreness, swelling, or bruising near your incision. °· If you were prescribed an antibiotic medicine, take it as told by your health care provider. Do not stop taking the antibiotic even if you start to feel better. °· Do not drive for 24 hours   if you were given a sedative during your procedure. °· Return to your normal activities as told by your health care provider. Ask your health care provider what activities are safe for you. °This information is not intended to replace advice given to you by your health care provider. Make sure you discuss any questions you have with your health care provider. °Document Released: 11/30/2014 Document Revised: 02/01/2017 Document Reviewed: 02/01/2017 °Elsevier Patient Education © 2020 [employer] ° ° ° °Moderate  Conscious Sedation, Adult, Care After °These instructions provide you with information about caring for yourself after your procedure. Your health care provider may also give you more specific instructions. Your treatment has been planned according to current medical practices, but problems sometimes occur. Call your health care provider if you have any problems or questions after your procedure. °What can I expect after the procedure? °After your procedure, it is common: °· To feel sleepy for several hours. °· To feel clumsy and have poor balance for several hours. °· To have poor judgment for several hours. °· To vomit if you eat too soon. °Follow these instructions at home: °For at least 24 hours after the procedure: ° °· Do not: °? Participate in activities where you could fall or become injured. °? Drive. °? Use heavy machinery. °? Drink alcohol. °? Take sleeping pills or medicines that cause drowsiness. °? Make important decisions or sign legal documents. °? Take care of children on your own. °· Rest. °Eating and drinking °· Follow the diet recommended by your health care provider. °· If you vomit: °? Drink water, juice, or soup when you can drink without vomiting. °? Make sure you have little or no nausea before eating solid foods. °General instructions °· Have a responsible adult stay with you until you are awake and alert. °· Take over-the-counter and prescription medicines only as told by your health care provider. °· If you smoke, do not smoke without supervision. °· Keep all follow-up visits as told by your health care provider. This is important. °Contact a health care provider if: °· You keep feeling nauseous or you keep vomiting. °· You feel light-headed. °· You develop a rash. °· You have a fever. °Get help right away if: °· You have trouble breathing. °This information is not intended to replace advice given to you by your health care provider. Make sure you discuss any questions you have with your  health care provider. °Document Released: 10/09/2012 Document Revised: 12/01/2016 Document Reviewed: 04/10/2015 °Elsevier Patient Education © 2020 [employer] ° °

## 2018-08-22 NOTE — Procedures (Signed)
Interventional Radiology Procedure Note  Procedure: Removal of a right IJ approach single lumen PowerPort.  Removed in entirety   Complications: None  Recommendations:  - Ok to shower tomorrow - Do not submerge for 7 days - Routine care   Signed,  Lydiann Bonifas S. Brittan Butterbaugh, DO    

## 2018-08-22 NOTE — H&P (Signed)
Chief Complaint: Patient was seen in consultation today for Hodgkin's lymphoma/Port-a-cath removal.  Referring Physician(s): Heilingoetter,Cassandra L  Supervising Physician: Corrie Mckusick  Patient Status: [facility] - Out-pt  History of Present Illness: Jeffery Crane is a 23 y.o. male with a past medical history of headache, GERD, nodular sclerosing Hodgkin's lymphoma, anemia, and ADHD. He was unfortunately diagnosed with nodular sclerosing Hodgkin's lymphoma in 03/2015. His cancer is managed by Dr. Julien Nordmann and Cassandra Heilingoetter, PA-C. He had a Port-a-cath placed in IR 04/16/2015 by Dr. Annamaria Boots. He has undergone a course of systemic chemotherapy (begain 04/20/2015 for 6 cycles) and curative radiotherapy (completed 10/09/2016). He has since been managed with observation (routine imaging scans to monitor for changes). His most recent follow-up imaging scans revealed no evidence of disease recurrence.  IR requested by Cassandra Heilingoetter, PA-C for possible Port-a-cath removal. Patient awake and alert laying in bed with no complaints at this time. Denies fever, chills, chest pain, dyspnea, abdominal pain, or headache.   Past Medical History:  Diagnosis Date  . Adult ADHD   . Anemia   . Chemotherapy induced neutropenia ([place]) 06/04/2015  . Encounter for antineoplastic chemotherapy 05/04/2015  . GERD (gastroesophageal reflux disease)   . Headache   . Nodular sclerosing Hodgkin's lymphoma ([place])   . 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 Pneumonia     Past Surgical History:  Procedure Laterality Date  . CARDIAC CATHETERIZATION N/A 04/01/2015   Procedure: Pericardiocentesis;  Surgeon: Sherren Mocha, MD;  Location: [place] [facility];  Service: Cardiovascular;  Laterality: N/A;  . SCALENE NODE BIOPSY Right 03/15/2015   Procedure: BIOPSY SCALENE NODE;  Surgeon: [name], MD;  Location: [place];  Service: Thoracic;  Laterality: Right;  .  SUBXYPHOID PERICARDIAL WINDOW  04/29/2015  . SUBXYPHOID PERICARDIAL WINDOW N/A 04/29/2015   Procedure: SUBXYPHOID PERICARDIAL WINDOW;  Surgeon: [name], MD;  Location: [place];  Service: Thoracic;  Laterality: N/A;  . SUPRACLAVICAL NODE BIOPSY Right 03/15/2015   Procedure: RIGHT SUPRACLAVICAL/CERVICAL LYMPH NODE BIOPSY;  Surgeon: [name], MD;  Location: [place];  Service: Thoracic;  Laterality: Right;  . TEE WITHOUT CARDIOVERSION N/A 04/29/2015   Procedure: TRANSESOPHAGEAL ECHOCARDIOGRAM (TEE);  Surgeon: [name], MD;  Location: [place];  Service: Thoracic;  Laterality: N/A;  . VIDEO BRONCHOSCOPY Bilateral 01/22/2015   Procedure: VIDEO BRONCHOSCOPY WITH FLUORO;  Surgeon: Marshell Garfinkel, MD;  Location: [place];  Service: Cardiopulmonary;  Laterality: Bilateral;    Allergies: Amoxicillin-pot clavulanate and Bactrim  Medications: Prior to Admission medications   Not on File     Family History  Problem Relation Age of Onset  . Narcolepsy Mother   . Heart disease Maternal Grandfather   . Lung disease Maternal Grandfather        From smoking  . Cancer Neg Hx     Social History   Socioeconomic History  . Marital status: Single    Spouse name: Not on file  . Number of children: Not on file  . Years of education: Not on file  . Highest education level: Not on file  Occupational History  . Not on file  Social Needs  . Financial resource strain: Not on file  . Food insecurity    Worry: Not on file    Inability: Not on file  . Transportation needs    Medical: Not on file    Non-medical: Not on file  Tobacco Use  . Smoking status: Former Smoker  Quit date: 09/16/2015    Years since quitting: 2.9  . Smokeless tobacco: Never Used  . Tobacco comment: E cigarettes/Vaped.  Substance and Sexual Activity  . Alcohol use: Yes    Comment: occasionally  . Drug use: Yes    Types: Marijuana  . Sexual activity: Yes  Lifestyle  . Physical activity     Days per week: Not on file    Minutes per session: Not on file  . Stress: Not on file  Relationships  . Social Herbalist on phone: Not on file    Gets together: Not on file    Attends religious service: Not on file    Active member of club or organization: Not on file    Attends meetings of clubs or organizations: Not on file    Relationship status: Not on file  Other Topics Concern  . Not on file  Social History Narrative  . Not on file     Review of Systems: A 12 point ROS discussed and pertinent positives are indicated in the HPI above.  All other systems are negative.  Review of Systems  Constitutional: Negative for chills and fever.  Respiratory: Negative for shortness of breath and wheezing.   Cardiovascular: Negative for chest pain and palpitations.  Gastrointestinal: Negative for abdominal pain.  Neurological: Negative for headaches.  Psychiatric/Behavioral: Negative for behavioral problems and confusion.    Vital Signs: Wt 203 lb (92.1 kg)   BMI 29.13 kg/m   Physical Exam Vitals signs and nursing note reviewed.  Constitutional:      General: He is not in acute distress.    Appearance: Normal appearance.  Cardiovascular:     Rate and Rhythm: Normal rate and regular rhythm.     Heart sounds: Normal heart sounds. No murmur.  Pulmonary:     Effort: Pulmonary effort is normal. No respiratory distress.     Breath sounds: Normal breath sounds. No wheezing.  Skin:    General: Skin is warm and dry.  Neurological:     Mental Status: He is alert and oriented to person, place, and time.  Psychiatric:        Mood and Affect: Mood normal.        Behavior: Behavior normal.        Thought Content: Thought content normal.        Judgment: Judgment normal.      MD Evaluation Airway: WNL Heart: WNL Abdomen: WNL Chest/ Lungs: WNL ASA  Classification: 3 Mallampati/Airway Score: One   Imaging: Ct Soft Tissue Neck W Contrast  Result Date: 07/25/2018  CLINICAL DATA:  Lymphoma, Hodgkin's diagnosed in 2017 with completed chemo and radiotherapy EXAM: CT NECK WITH CONTRAST TECHNIQUE: Multidetector CT imaging of the neck was performed using the standard protocol following the bolus administration of intravenous contrast. CONTRAST:  47mL OMNIPAQUE IOHEXOL 300 MG/ML  SOLN COMPARISON:  07/30/2017 FINDINGS: Pharynx and larynx: No asymmetric or new thickening of Waldeyer's ring. Salivary glands: No inflammation, mass, or stone. Thyroid: Normal. Lymph nodes: None enlarged or abnormal density. Vascular: Negative. Limited intracranial: Negative. Visualized orbits: Negative. Mastoids and visualized paranasal sinuses: Small retention cyst in the right maxillary sinus, stable. Skeleton: No acute or aggressive process. Upper chest: Reported separately IMPRESSION: Negative neck CT.  No evidence of disease. Electronically Signed   By: Monte Fantasia M.D.   On: 07/25/2018 04:30   Ct Chest W Contrast  Result Date: 07/24/2018 CLINICAL DATA:  History of non-Hodgkin's lymphoma diagnosed in 2017.  Chemotherapy and radiation therapy complete. No current complaints. EXAM: CT CHEST WITH CONTRAST TECHNIQUE: Multidetector CT imaging of the chest was performed during intravenous contrast administration. CONTRAST:  54mL OMNIPAQUE IOHEXOL 300 MG/ML  SOLN COMPARISON:  07/30/2017 FINDINGS: Cardiovascular: Right Port-A-Cath tip at superior caval/atrial junction. Normal caliber of the aorta and branch vessels. Normal heart size, without pericardial effusion. No central pulmonary embolism, on this non-dedicated study. Mediastinum/Nodes: No middle mediastinal or hilar adenopathy. Anterior mediastinal partially calcified lesion measures 1.7 x 1.2 cm on 25/4. Compare 2.0 x 1.3 cm on the prior. Lungs/Pleura: No pleural fluid. Minimal left lower lobe scarring or subsegmental atelectasis. The anteromedial right upper lobe 7 mm pulmonary nodule is similar on 69/7. Upper Abdomen: Normal imaged portions  of the liver, spleen, stomach, pancreas, adrenal glands, gallbladder, kidneys. Musculoskeletal: No acute osseous abnormality. IMPRESSION: 1. Further decrease in size of a partially calcified anterior mediastinal lesion. 2. No new or progressive disease. 3. Similar 7 mm right upper lobe pulmonary nodule. Electronically Signed   By: Abigail Miyamoto M.D.   On: 07/24/2018 16:23    Labs:  CBC: Recent Labs    01/15/18 1325 07/24/18 0916 08/22/18 1230  WBC 3.8* 3.5* 3.8*  HGB 15.8 15.5 15.9  HCT 45.5 45.4 46.4  PLT 228 207 214    COAGS: No results for input(s): INR, APTT in the last 8760 hours.  BMP: Recent Labs    01/15/18 1325 07/24/18 0916  NA 142 141  K 4.2 4.2  CL 108 107  CO2 26 24  GLUCOSE 93 105*  BUN 10 9  CALCIUM 9.1 9.3  CREATININE 0.91 0.92  GFRNONAA >60 >60  GFRAA >60 >60    LIVER FUNCTION TESTS: Recent Labs    01/15/18 1325 07/24/18 0916  BILITOT 0.7 0.7  AST 24 28  ALT 18 18  ALKPHOS 71 70  PROT 7.1 7.1  ALBUMIN 4.2 3.9     Assessment and Plan:  Nodular sclerosing Hodgkin's lymphoma, currently in remission. Plan for Port-a-cath removal today with Dr. Earleen Newport. Patient is NPO. Afebrile. He does not take blood thinners. INR pending.  Risks and benefits of image guided port-a-catheter removal were discussed with the patient including, but not limited to bleeding, infection, or fibrin sheath development and need for additional procedures. All of the patient's questions were answered, patient is agreeable to proceed. Consent signed and in chart.   Thank you for this interesting consult.  I greatly enjoyed meeting Jeffery Crane and look forward to participating in their care.  A copy of this report was sent to the requesting provider on this date.  Electronically Signed: Earley Abide, PA-C 08/22/2018, 12:54 PM   I spent a total of 25 Minutes in face to face in clinical consultation, greater than 50% of which was counseling/coordinating care for  Hodgkin's lymphoma/Port-a-cath removal.

## 2018-12-02 ENCOUNTER — Other Ambulatory Visit: Payer: Self-pay | Admitting: Cardiology

## 2018-12-02 DIAGNOSIS — Z2001 Contact with and (suspected) exposure to intestinal infectious diseases due to Escherichia coli (E. coli): Secondary | ICD-10-CM

## 2018-12-03 LAB — NOVEL CORONAVIRUS, NAA: SARS-CoV-2, NAA: NOT DETECTED

## 2019-02-23 ENCOUNTER — Encounter (HOSPITAL_COMMUNITY): Payer: Self-pay | Admitting: Emergency Medicine

## 2019-02-23 ENCOUNTER — Ambulatory Visit (HOSPITAL_COMMUNITY)
Admission: EM | Admit: 2019-02-23 | Discharge: 2019-02-23 | Disposition: A | Discharge status: Home / Self Care | Payer: Medicaid Other | Attending: Emergency Medicine | Admitting: Emergency Medicine

## 2019-02-23 ENCOUNTER — Other Ambulatory Visit: Payer: Self-pay

## 2019-02-23 DIAGNOSIS — Z7251 High risk heterosexual behavior: Secondary | ICD-10-CM

## 2019-02-23 DIAGNOSIS — Z113 Encounter for screening for infections with a predominantly sexual mode of transmission: Secondary | ICD-10-CM

## 2019-02-23 NOTE — Discharge Instructions (Signed)
Will notify you of any positive findings and if any changes to treatment are needed.   You may monitor your results on your MyChart online as well.   Please use condoms to prevent STDs.

## 2019-02-23 NOTE — ED Triage Notes (Signed)
Pt admits to a lot of unprotected sex and wants to be tested for STD's.  He denies any symptoms and wants blood work as well.

## 2019-02-23 NOTE — ED Provider Notes (Signed)
[place]    CSN: [id number] Arrival date & time: 02/23/19  1014      History   Chief Complaint Chief Complaint  Patient presents with  . STD Testing    HPI Jeffery Crane is a 24 y.o. male.   Jeffery Crane presents with requests for std screening. States he has had multiple male sexual partners and hasn't been using condoms. No specific known exposure. Denies any current concerns- denies penile discharge, itching, redness, swelling, pain with urination or pelvic pain. States has had chlamydia in the past. States he thinks his last std screening was approximately 1 year ago.     ROS per HPI, negative if not otherwise mentioned.      Past Medical History:  Diagnosis Date  . Adult ADHD   . Anemia   . Chemotherapy induced neutropenia ([place]) 06/04/2015  . Encounter for antineoplastic chemotherapy 05/04/2015  . GERD (gastroesophageal reflux disease)   . Headache   . 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 Pneumonia     Patient Active Problem List   Diagnosis Date Noted  . Liver dysfunction 08/01/2017  . Port catheter in place 06/15/2016  . Pneumonitis 10/17/2015  . Chemotherapy induced neutropenia ([place]) 06/04/2015  . 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 Encounter for antineoplastic chemotherapy 05/04/2015  . Pericardial effusion 03/29/2015  . 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 CAP (community acquired pneumonia)   . Cavitary lung disease   . Cavitary lesion of lung 01/19/2015  . Leukocytosis 01/19/2015  . Thrombocytosis ([place]) 01/19/2015  . Anemia 01/19/2015    Past Surgical History:  Procedure Laterality Date  . CARDIAC CATHETERIZATION N/A 04/01/2015   Procedure: Pericardiocentesis;  Surgeon: Sherren Mocha, MD;  Location: [place] [facility];  Service: Cardiovascular;  Laterality: N/A;  . IR REMOVAL TUN  ACCESS W/ PORT W/O FL MOD SED  08/22/2018  . SCALENE NODE BIOPSY Right 03/15/2015   Procedure: BIOPSY SCALENE NODE;  Surgeon: [name], MD;  Location: [place];  Service: Thoracic;  Laterality: Right;  . SUBXYPHOID PERICARDIAL WINDOW  04/29/2015  . SUBXYPHOID PERICARDIAL WINDOW N/A 04/29/2015   Procedure: SUBXYPHOID PERICARDIAL WINDOW;  Surgeon: [name], MD;  Location: [place];  Service: Thoracic;  Laterality: N/A;  . SUPRACLAVICAL NODE BIOPSY Right 03/15/2015   Procedure: RIGHT SUPRACLAVICAL/CERVICAL LYMPH NODE BIOPSY;  Surgeon: [name], MD;  Location: [place];  Service: Thoracic;  Laterality: Right;  . TEE WITHOUT CARDIOVERSION N/A 04/29/2015   Procedure: TRANSESOPHAGEAL ECHOCARDIOGRAM (TEE);  Surgeon: [name], MD;  Location: [place];  Service: Thoracic;  Laterality: N/A;  . VIDEO BRONCHOSCOPY Bilateral 01/22/2015   Procedure: VIDEO BRONCHOSCOPY WITH FLUORO;  Surgeon: Marshell Garfinkel, MD;  Location: [place];  Service: Cardiopulmonary;  Laterality: Bilateral;       Home Medications    Prior to Admission medications   Not on File    Family History Family History  Problem Relation Age of Onset  . Narcolepsy Mother   . Heart disease Maternal Grandfather   . Lung disease Maternal Grandfather        From smoking  . Cancer Neg Hx     Social History Social History   Tobacco Use  . Smoking status: Former Smoker    Quit date: 09/16/2015    Years since quitting: 3.4  . Smokeless tobacco: Never Used  . Tobacco  comment: E cigarettes/Vaped.  Substance Use Topics  . Alcohol use: Yes    Comment: occasionally  . Drug use: Yes    Types: Marijuana     Allergies   Amoxicillin-pot clavulanate and Bactrim   Review of Systems Review of Systems   Physical Exam Triage Vital Signs ED Triage Vitals  Enc Vitals Group     BP 02/23/19 1026 128/88     Pulse Rate 02/23/19 1026 78     Resp 02/23/19 1026 12     Temp 02/23/19 1026 98.2 F (36.8  C)     Temp Source 02/23/19 1026 Oral     SpO2 02/23/19 1026 97 %     Weight --      Height --      Head Circumference --      Peak Flow --      Pain Score 02/23/19 1028 0     Pain Loc --      Pain Edu? --      Excl. in [place]? --    No data found.  Updated Vital Signs BP 128/88 (BP Location: Right Arm)   Pulse 78   Temp 98.2 F (36.8 C) (Oral)   Resp 12   SpO2 97%    Physical Exam Constitutional:      Appearance: He is well-developed.  Cardiovascular:     Rate and Rhythm: Normal rate and regular rhythm.  Pulmonary:     Effort: Pulmonary effort is normal.     Breath sounds: Normal breath sounds.  Abdominal:     Palpations: Abdomen is soft. Abdomen is not rigid.     Tenderness: There is no abdominal tenderness. There is no guarding or rebound. Negative signs include Murphy's sign and McBurney's sign.     Comments: Denies scrotal redness, swelling, pain; denies sores or lesions; gu exam deferred   Skin:    General: Skin is warm and dry.  Neurological:     Mental Status: He is alert and oriented to person, place, and time.      UC Treatments / Results  Labs (all labs ordered are listed, but only abnormal results are displayed) Labs Reviewed  RPR  HIV ANTIBODY (ROUTINE TESTING W REFLEX)  CYTOLOGY, (ORAL, ANAL, URETHRAL) ANCILLARY ONLY    EKG   Radiology No results found.  Procedures Procedures (including critical care time)  Medications Ordered in UC Medications - No data to display  Initial Impression / Assessment and Plan / UC Course  I have reviewed the triage vital signs and the nursing notes.  Pertinent labs & imaging results that were available during my care of the patient were reviewed by me and considered in my medical decision making (see chart for details).     Std screening collected today without specific complaints. Safe sex encouraged. Patient verbalized understanding and agreeable to plan.   Final Clinical Impressions(s) / UC Diagnoses    Final diagnoses:  Screen for STD (sexually transmitted disease)  High risk heterosexual behavior     Discharge Instructions     Will notify you of any positive findings and if any changes to treatment are needed.   You may monitor your results on your MyChart online as well.   Please use condoms to prevent STDs.    ED Prescriptions    None     PDMP not reviewed this encounter.   Zigmund Gottron, NP 02/23/19 1044

## 2019-02-24 LAB — RPR: RPR Ser Ql: NONREACTIVE

## 2019-02-25 LAB — HIV ANTIBODY (ROUTINE TESTING W REFLEX): HIV Screen 4th Generation wRfx: NONREACTIVE — AB

## 2019-02-25 LAB — CYTOLOGY, (ORAL, ANAL, URETHRAL) ANCILLARY ONLY
Chlamydia: NEGATIVE
Neisseria Gonorrhea: NEGATIVE
Trichomonas: NEGATIVE

## 2019-06-03 ENCOUNTER — Other Ambulatory Visit: Payer: Self-pay

## 2019-06-03 ENCOUNTER — Ambulatory Visit (HOSPITAL_COMMUNITY)
Admission: EM | Admit: 2019-06-03 | Discharge: 2019-06-03 | Disposition: A | Discharge status: Home / Self Care | Payer: Medicaid Other | Attending: Family Medicine | Admitting: Family Medicine

## 2019-06-03 ENCOUNTER — Encounter (HOSPITAL_COMMUNITY): Payer: Self-pay

## 2019-06-03 DIAGNOSIS — R369 Urethral discharge, unspecified: Secondary | ICD-10-CM

## 2019-06-03 MED ORDER — CEFTRIAXONE SODIUM 500 MG IJ SOLR
500.0000 mg | Freq: Once | INTRAMUSCULAR | Status: AC
  Administered 2019-06-03: 500 mg via INTRAMUSCULAR

## 2019-06-03 MED ORDER — DOXYCYCLINE HYCLATE 100 MG PO CAPS: 100.0000 mg | ORAL_CAPSULE | Freq: Two times a day (BID) | ORAL | 0 refills | Status: DC

## 2019-06-03 MED ORDER — CEFTRIAXONE SODIUM 500 MG IJ SOLR
INTRAMUSCULAR | Status: AC
  Filled 2019-06-03: qty 500

## 2019-06-03 MED ORDER — LIDOCAINE HCL (PF) 1 % IJ SOLN
INTRAMUSCULAR | Status: AC
  Filled 2019-06-03: qty 2

## 2019-06-03 NOTE — ED Provider Notes (Signed)
[place]   [id number] 06/03/19 Arrival Time: [id number]  ASSESSMENT & PLAN:  1. Penile discharge       Discharge Instructions     You have been given the following today for treatment of suspected gonorrhea and/or chlamydia:  cefTRIAXone (ROCEPHIN) injection 500 mg  Please pick up your prescription for doxycycline 100 mg and begin taking twice daily for the next seven (7) days.  Even though we have treated you today, we have sent testing for sexually transmitted infections. We will notify you of any positive results once they are received. If required, we will prescribe any medications you might need.  Please refrain from all sexual activity for at least the next seven days.     Pending: Labs Reviewed  CYTOLOGY, (ORAL, ANAL, URETHRAL) ANCILLARY ONLY    Will notify of any positive results. Instructed to refrain from sexual activity for at least seven days.  Reviewed expectations re: course of current medical issues. Questions answered. Outlined signs and symptoms indicating need for more acute intervention. Patient verbalized understanding. After Visit Summary given.   SUBJECTIVE:  Jeffery Crane is a 24 y.o. male who presents with complaint of penile discharge. Onset gradual. First noticed a week ago. Describes discharge as thick and opaque. No specific aggravating or alleviating factors reported. Denies: urinary frequency, dysuria and gross hematuria. Afebrile. No abdominal or pelvic pain. No n/v. No rashes or lesions. Reports that he is sexually active with multiple male partners; approx six. OTC treatment: none. History of STI: none reported.    OBJECTIVE:  Vitals:   06/03/19 1021  BP: 136/87  Pulse: 70  Resp: 14  Temp: 98.2 F (36.8 C)  TempSrc: Oral  SpO2: 100%     General appearance: alert, cooperative, appears stated age and no distress Throat: lips, mucosa, and tongue normal; teeth and gums normal Lungs: unlabored respirations; speaks  full sentences without difficulty Back: no CVA tenderness; FROM at waist Abdomen: soft, non-tender GU: deferrred Skin: warm and dry Psychological: alert and cooperative; normal mood and affect.  Pending: Labs Reviewed  CYTOLOGY, (ORAL, ANAL, URETHRAL) ANCILLARY ONLY    Allergies  Allergen Reactions  . Amoxicillin-Pot Clavulanate Hives and Rash  . Bactrim Other (See Comments)    Unknown children reaction per pt's mom    Past Medical History:  Diagnosis Date  . Adult ADHD   . Anemia   . Chemotherapy induced neutropenia ([place]) 06/04/2015  . Encounter for antineoplastic chemotherapy 05/04/2015  . GERD (gastroesophageal reflux disease)   . Headache   . Nodular sclerosing Hodgkin's lymphoma ([place])   . 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 Pneumonia    Family History  Problem Relation Age of Onset  . Narcolepsy Mother   . Heart disease Maternal Grandfather   . Lung disease Maternal Grandfather        From smoking  . Cancer Neg Hx    Social History   Socioeconomic History  . Marital status: Single    Spouse name: Not on file  . Number of children: Not on file  . Years of education: Not on file  . Highest education level: Not on file  Occupational History  . Not on file  Tobacco Use  . Smoking status: Former Smoker    Quit date: 09/16/2015    Years since quitting: 3.7  . Smokeless tobacco: Never Used  . Tobacco comment: E cigarettes/Vaped.  Substance and Sexual Activity  . Alcohol use: Yes  Comment: occasionally  . Drug use: Yes    Types: Marijuana  . Sexual activity: Yes  Other Topics Concern  . Not on file  Social History Narrative  . Not on file   Social Determinants of Health   Financial Resource Strain:   . Difficulty of Paying Living Expenses:   Food Insecurity:   . Worried About Charity fundraiser in the Last Year:   . Arboriculturist in the Last Year:   Transportation Needs:   . Film/video editor  (Medical):   Marland Kitchen Lack of Transportation (Non-Medical):   Physical Activity:   . Days of Exercise per Week:   . Minutes of Exercise per Session:   Stress:   . Feeling of Stress :   Social Connections:   . Frequency of Communication with Friends and Family:   . Frequency of Social Gatherings with Friends and Family:   . Attends Religious Services:   . Active Member of Clubs or Organizations:   . Attends Archivist Meetings:   Marland Kitchen Marital Status:   Intimate Partner Violence:   . Fear of Current or Ex-Partner:   . Emotionally Abused:   Marland Kitchen Physically Abused:   . Sexually Abused:           Vanessa Kick, MD 06/03/19 1049

## 2019-06-03 NOTE — Discharge Instructions (Signed)

## 2019-06-03 NOTE — ED Triage Notes (Signed)
Patient reports penile discharge and penile itching x1 wk

## 2019-06-05 LAB — CYTOLOGY, (ORAL, ANAL, URETHRAL) ANCILLARY ONLY
Chlamydia: POSITIVE — AB
Comment: NEGATIVE
Comment: NEGATIVE
Comment: NORMAL
Neisseria Gonorrhea: NEGATIVE
Trichomonas: NEGATIVE

## 2019-07-21 ENCOUNTER — Telehealth: Payer: Self-pay | Admitting: Medical Oncology

## 2019-07-21 NOTE — Telephone Encounter (Addendum)
Pt asked to r/s labs and scan for tomorrow. I gave him number to call to r/s.

## 2019-07-22 ENCOUNTER — Inpatient Hospital Stay: Payer: Medicaid Other

## 2019-07-22 ENCOUNTER — Ambulatory Visit (HOSPITAL_COMMUNITY): Admission: RE | Admit: 2019-07-22 | Payer: Self-pay | Source: Ambulatory Visit

## 2019-07-24 ENCOUNTER — Ambulatory Visit: Payer: Medicaid Other | Admitting: Physician Assistant

## 2019-07-29 ENCOUNTER — Other Ambulatory Visit: Payer: Self-pay | Admitting: *Deleted

## 2019-07-29 ENCOUNTER — Other Ambulatory Visit: Payer: Self-pay | Admitting: Physician Assistant

## 2019-07-29 DIAGNOSIS — C8112 Nodular sclerosis classical Hodgkin lymphoma, intrathoracic lymph nodes: Secondary | ICD-10-CM

## 2019-07-30 ENCOUNTER — Ambulatory Visit (HOSPITAL_COMMUNITY)
Admission: RE | Admit: 2019-07-30 | Discharge: 2019-07-30 | Disposition: A | Discharge status: Home / Self Care | Payer: Self-pay | Source: Ambulatory Visit | Attending: Physician Assistant | Admitting: Physician Assistant

## 2019-07-30 ENCOUNTER — Other Ambulatory Visit: Payer: Self-pay

## 2019-07-30 ENCOUNTER — Inpatient Hospital Stay: Payer: Self-pay | Attending: Physician Assistant

## 2019-07-30 DIAGNOSIS — C8112 Nodular sclerosis classical Hodgkin lymphoma, intrathoracic lymph nodes: Secondary | ICD-10-CM

## 2019-07-30 DIAGNOSIS — Z8571 Personal history of Hodgkin lymphoma: Secondary | ICD-10-CM | POA: Insufficient documentation

## 2019-07-30 LAB — CBC WITH DIFFERENTIAL (CANCER CENTER ONLY)
Abs Immature Granulocytes: 0.01 10*3/uL (ref 0.00–0.07)
Basophils Absolute: 0.1 10*3/uL (ref 0.0–0.1)
Basophils Relative: 1 %
Eosinophils Absolute: 0.1 10*3/uL (ref 0.0–0.5)
Eosinophils Relative: 2 %
HCT: 44.3 % (ref 39.0–52.0)
Hemoglobin: 15.5 g/dL (ref 13.0–17.0)
Immature Granulocytes: 0 %
Lymphocytes Relative: 44 %
Lymphs Abs: 2 10*3/uL (ref 0.7–4.0)
MCH: 30.5 pg (ref 26.0–34.0)
MCHC: 35 g/dL (ref 30.0–36.0)
MCV: 87.2 fL (ref 80.0–100.0)
Monocytes Absolute: 0.5 10*3/uL (ref 0.1–1.0)
Monocytes Relative: 10 %
Neutro Abs: 1.9 10*3/uL (ref 1.7–7.7)
Neutrophils Relative %: 43 %
Platelet Count: 200 10*3/uL (ref 150–400)
RBC: 5.08 MIL/uL (ref 4.22–5.81)
RDW: 12.1 % (ref 11.5–15.5)
WBC Count: 4.4 10*3/uL (ref 4.0–10.5)
nRBC: 0 % (ref 0.0–0.2)

## 2019-07-30 LAB — CMP (CANCER CENTER ONLY)
ALT: 14 U/L (ref 0–44)
AST: 23 U/L (ref 15–41)
Albumin: 4.2 g/dL (ref 3.5–5.0)
Alkaline Phosphatase: 64 U/L (ref 38–126)
Anion gap: 7 (ref 5–15)
BUN: 9 mg/dL (ref 6–20)
CO2: 27 mmol/L (ref 22–32)
Calcium: 9.6 mg/dL (ref 8.9–10.3)
Chloride: 106 mmol/L (ref 98–111)
Creatinine: 0.95 mg/dL (ref 0.61–1.24)
GFR, Est AFR Am: >60 mL/min (ref >60)
GFR, Estimated: >60 mL/min (ref >60)
Glucose, Bld: 90 mg/dL (ref 70–99)
Potassium: 4 mmol/L (ref 3.5–5.1)
Sodium: 140 mmol/L (ref 135–145)
Total Bilirubin: 0.7 mg/dL (ref 0.3–1.2)
Total Protein: 7.1 g/dL (ref 6.5–8.1)

## 2019-07-30 LAB — LACTATE DEHYDROGENASE: LDH: 175 U/L (ref 98–192)

## 2019-07-30 MED ORDER — IOHEXOL 300 MG/ML  SOLN
75.0000 mL | Freq: Once | INTRAMUSCULAR | Status: AC | PRN
  Administered 2019-07-30: 75 mL via INTRAVENOUS

## 2019-07-30 MED ORDER — SODIUM CHLORIDE (PF) 0.9 % IJ SOLN
INTRAMUSCULAR | Status: AC
  Filled 2019-07-30: qty 50

## 2019-08-04 NOTE — Progress Notes (Signed)
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 Cytology was negative for lymphoma. 2) status post subxiphoid pericardial window under the care of Dr. Roxan Hockey on 04/29/2015. 3) Systemic chemotherapy with ABVD. First dose 04/20/2015. Status post 6 cycles.  4) curative radiotherapy to the recurrent mediastinal adenopathy under the care of Dr. Tammi Klippel completed October 09, 2016.  [facility] THERAPY: Observation  INTERVAL HISTORY: Jeffery Crane 24 y.o. male returns to the clinic today for a follow-up visit.  The patient is feeling well without any concerning complaints except he is concerned that he grinds his teeth at night because he wakes up with headaches. He did have a cold last week.    He has been on observation since 2018. He denies any recent fevers, chills, night sweats, weight loss, or lymphadenopathy. He denies any chest pain, shortness of breath, cough, or hemoptysis.  He denies any nausea, vomiting, diarrhea, constipation, abdominal pain, or early satiety.  He denies any abnormal bleeding or bruising.  The patient recently had a restaging CT scan performed of the neck and chest.  He is here today for evaluation and to review his scan results.  MEDICAL HISTORY: Past Medical History:  Diagnosis Date  . Adult ADHD   . Anemia   . Chemotherapy induced neutropenia ([place]) 06/04/2015  . Encounter for antineoplastic chemotherapy 05/04/2015  . GERD (gastroesophageal reflux disease)   . Headache   . Nodular sclerosing Hodgkin's lymphoma ([place])   . 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 Pneumonia     ALLERGIES:  is allergic to amoxicillin-pot clavulanate and bactrim.  MEDICATIONS:  No [facility] outpatient medications on file.   No [facility] facility-administered medications for this visit.   Facility-Administered Medications Ordered in Other Visits  Medication Dose Route Frequency Provider Last Rate Last Admin  . sodium chloride flush (NS) 0.9 % injection 10 mL  10 mL Intracatheter PRN Curt Bears, MD   10 mL at 09/28/15 1654    SURGICAL HISTORY:  Past Surgical History:  Procedure Laterality Date  . CARDIAC CATHETERIZATION N/A 04/01/2015   Procedure: Pericardiocentesis;  Surgeon: Sherren Mocha, MD;  Location: [facility];  Service: Cardiovascular;  Laterality: N/A;  . IR REMOVAL TUN ACCESS W/ PORT W/O FL MOD SED  08/22/2018  . SCALENE NODE BIOPSY Right 03/15/2015   Procedure: BIOPSY SCALENE NODE;  Surgeon: [name], MD;  Location: [facility];  Service: Thoracic;  Laterality: Right;  . SUBXYPHOID PERICARDIAL WINDOW  04/29/2015  . SUBXYPHOID PERICARDIAL WINDOW N/A 04/29/2015   Procedure: SUBXYPHOID PERICARDIAL WINDOW;  Surgeon: [name], MD;  Location: [facility];  Service: Thoracic;  Laterality: N/A;  . SUPRACLAVICAL NODE BIOPSY Right 03/15/2015   Procedure: RIGHT SUPRACLAVICAL/CERVICAL LYMPH NODE BIOPSY;  Surgeon: [name], MD;  Location: [facility];  Service: Thoracic;  Laterality: Right;  . TEE WITHOUT CARDIOVERSION N/A 04/29/2015   Procedure: TRANSESOPHAGEAL ECHOCARDIOGRAM (TEE);  Surgeon: [name], MD;  Location: [facility];  Service: Thoracic;  Laterality: N/A;  . VIDEO BRONCHOSCOPY Bilateral 01/22/2015   Procedure: VIDEO BRONCHOSCOPY WITH FLUORO;  Surgeon: Marshell Garfinkel, MD;  Location: [facility];  Service: Cardiopulmonary;  Laterality:  Bilateral;    REVIEW OF SYSTEMS:   Review of Systems  Constitutional: Negative for appetite change, chills, fatigue, fever and unexpected weight change.  HENT: Negative for  mouth sores, nosebleeds, sore throat and trouble swallowing.   Eyes: Negative for eye problems and icterus.  Respiratory: Negative for cough, hemoptysis, shortness of breath and wheezing.   Cardiovascular: Negative for chest pain and leg swelling.  Gastrointestinal: Negative for abdominal pain, constipation, diarrhea, nausea and vomiting.  Genitourinary: Negative for bladder incontinence, difficulty urinating, dysuria, frequency and hematuria.   Musculoskeletal: Negative for back pain, gait problem, neck pain and neck stiffness.  Skin: Negative for itching and rash.  Neurological: Positive for occasional headaches. Negative for dizziness, extremity weakness, gait problem, headaches, light-headedness and seizures.  Hematological: Negative for adenopathy. Does not bruise/bleed easily.  Psychiatric/Behavioral: Negative for confusion, depression and sleep disturbance. The patient is not nervous/anxious.     PHYSICAL EXAMINATION:  Blood pressure 137/83, pulse 73, temperature (!) 97.3 F (36.3 C), temperature source Temporal, resp. rate 18, height 5\' 10"  (1.778 m), weight 213 lb 3.2 oz (96.7 kg), SpO2 100 %.  ECOG PERFORMANCE STATUS: 0 - Asymptomatic  Physical Exam  Constitutional: Oriented to person, place, and time and well-developed, well-nourished, and in no distress.  HENT:  Head: Normocephalic and atraumatic.  Mouth/Throat: Oropharynx is clear and moist. No oropharyngeal exudate.  Eyes: Conjunctivae are normal. Right eye exhibits no discharge. Left eye exhibits no discharge. No scleral icterus.  Neck: Normal range of motion. Neck supple.  Cardiovascular: Normal rate, regular rhythm, normal heart sounds and intact distal pulses.   Pulmonary/Chest: Effort normal and breath sounds normal. No respiratory distress. No wheezes. No rales.  Abdominal: Soft. Bowel sounds are normal. Exhibits no distension and no mass. There is no tenderness.  Musculoskeletal: Normal range of motion. Exhibits no  edema.  Lymphadenopathy:    No cervical adenopathy.  Neurological: Alert and oriented to person, place, and time. Exhibits normal muscle tone. Gait normal. Coordination normal.  Skin: Skin is warm and dry. No rash noted. Not diaphoretic. No erythema. No pallor.  Psychiatric: Mood, memory and judgment normal.  Vitals reviewed.  LABORATORY DATA: Lab Results  Component Value Date   WBC 4.4 07/30/2019   HGB 15.5 07/30/2019   HCT 44.3 07/30/2019   MCV 87.2 07/30/2019   PLT 200 07/30/2019      Chemistry      Component Value Date/Time   NA 140 07/30/2019 1352   NA 143 06/15/2016 1124   K 4.0 07/30/2019 1352   K 4.1 06/15/2016 1124   CL 106 07/30/2019 1352   CL 105 12/18/2013 0848   CO2 27 07/30/2019 1352   CO2 26 06/15/2016 1124   BUN 9 07/30/2019 1352   BUN 8.9 06/15/2016 1124   CREATININE 0.95 07/30/2019 1352   CREATININE 0.9 06/15/2016 1124      Component Value Date/Time   CALCIUM 9.6 07/30/2019 1352   CALCIUM 9.4 06/15/2016 1124   ALKPHOS 64 07/30/2019 1352   ALKPHOS 106 06/15/2016 1124   AST 23 07/30/2019 1352   AST 18 06/15/2016 1124   ALT 14 07/30/2019 1352   ALT 17 06/15/2016 1124   BILITOT 0.7 07/30/2019 1352   BILITOT 0.63 06/15/2016 1124       RADIOGRAPHIC STUDIES:  CT Soft Tissue Neck W Contrast  Result Date: 07/31/2019 CLINICAL DATA:  Restaging lymphoma EXAM: CT NECK WITH CONTRAST TECHNIQUE: Multidetector CT imaging of the neck was performed using the standard protocol following the bolus  administration of intravenous contrast. CONTRAST:  34mL OMNIPAQUE IOHEXOL 300 MG/ML  SOLN COMPARISON:  CT neck 07/24/2018 FINDINGS: Pharynx and larynx: Normal. No mass or swelling. Salivary glands: No inflammation, mass, or stone. Thyroid: Normal Lymph nodes: No pathologic lymph nodes in the neck. 7 mm right level 2 lymph node unchanged. Left 7 mm level 2 lymph node unchanged. Scattered small posterior lymph nodes bilaterally appear slightly larger. This includes a 5 mm  right posterior lymph node and 2 small posterior lymph nodes on the left measuring approximately 4 mm and 5 mm. Vascular: Normal vascular enhancement. Limited intracranial: Negative Visualized orbits: Negative Mastoids and visualized paranasal sinuses: Negative Skeleton: Negative Upper chest: Lung apices clear bilaterally. Other: None IMPRESSION: No pathologic adenopathy in the neck. Small level 2 lymph nodes stable. Small posterior lymph nodes bilaterally slightly larger on the order of 1 - 2 mm larger. These may be reactive lymph nodes. Attention at follow-up recommended. Electronically Signed   By: Franchot Gallo M.D.   On: 07/31/2019 08:15   CT Chest W Contrast  Result Date: 07/31/2019 CLINICAL DATA:  Non-Hodgkin's lymphoma. EXAM: CT CHEST WITH CONTRAST TECHNIQUE: Multidetector CT imaging of the chest was performed during intravenous contrast administration. CONTRAST:  74mL OMNIPAQUE IOHEXOL 300 MG/ML  SOLN COMPARISON:  07/24/2018 FINDINGS: Cardiovascular: The heart size is normal. No substantial pericardial effusion. No thoracic aortic aneurysm. Mediastinum/Nodes: Similar appearance of the calcified anterior mediastinal nodule although measuring slightly smaller today at 1.5 x 1.0 cm compared to 1.7 x 1.2 cm previously. No mediastinal lymphadenopathy. There is no hilar lymphadenopathy. The esophagus has normal imaging features. There is no axillary lymphadenopathy. Lungs/Pleura: 5 mm medial right upper lobe nodule (57/7) has decreased from 7 mm previously. No new suspicious pulmonary nodule or mass. No focal airspace consolidation. No pleural effusion. Upper Abdomen: Unremarkable. Musculoskeletal: No worrisome lytic or sclerotic osseous abnormality. IMPRESSION: 1. Stable to slight decrease in size of the calcified anterior mediastinal nodule. 2. Interval decrease in size of the medial right upper lobe pulmonary nodule. 3. No new or progressive findings on today's study. Electronically Signed   By: Misty Stanley M.D.   On: 07/31/2019 10:23     ASSESSMENT/PLAN:  This is a very pleasant 24 year old African-American male with stage IIIa nodular sclerosing Hodgkin's lymphoma with bulky lymphadenopathy in the right lower cervical, right supraclavicular, right axillary as well as mediastinal lymphadenopathy. He also presented with a cavitary lung mass and splenic involvement. He was diagnosed in March of 2017. He is status post 6 cycles of systemic chemotherapy with ABVD.   He was on observation for several months. He was non-compliant with his follow up visits. Imaging studies showed evidence of disease recurrence involving the left superior paratracheal lymph nodes as well as a soft tissue mass. He then underwent curative radiotherapy under the care of Dr. Tammi Klippel which was completed in October of 2018.   He recently had a restaging CT scan performed. Dr. Julien Nordmann personally and independently reviewed the scan and discussed the results with the patient today. The scan did not show any evidence of disease recurrence.   I will arrange for the patient to obtain a restaging CT scan of the neck and chest in 1 year.   We will see the patient back for a follow up visit in 1 year for evaluation and to review his scan results.   Regarding his teeth grinding, we encouraged him to see a dentist.   He mentioned that he may move in 1 year.  Discussed that if he moves, we are happy to refer him and transfer his medical records to another facility locally.   The patient was advised to call immediately if he has any concerning symptoms in the interval. The patient voices understanding of Crane disease status and treatment options and is in agreement with the Crane care plan. All questions were answered. The patient knows to call the clinic with any problems, questions or concerns. We can certainly see the patient much sooner if necessary       Orders Placed This Encounter  Procedures  . CT Chest W  Contrast    Standing Status:   Future    Standing Expiration Date:   08/05/2020    Order Specific Question:   If indicated for the ordered procedure, I authorize the administration of contrast media per Radiology protocol    Answer:   Yes    Order Specific Question:   Preferred imaging location?    Answer:   [facility]    Order Specific Question:   Radiology Contrast Protocol - do NOT remove file path    Answer:   \\[email or web address]  . CT Soft Tissue Neck W Contrast    Standing Status:   Future    Standing Expiration Date:   08/05/2020    Order Specific Question:   If indicated for the ordered procedure, I authorize the administration of contrast media per Radiology protocol    Answer:   Yes    Order Specific Question:   Preferred imaging location?    Answer:   [facility]    Order Specific Question:   Radiology Contrast Protocol - do NOT remove file path    Answer:   \\[email or web address]  . CBC with Differential ([place] Only)    Standing Status:   Future    Standing Expiration Date:   08/05/2020  . CMP ([facility] only)    Standing Status:   Future    Standing Expiration Date:   08/05/2020  . Lactate dehydrogenase (LDH)    Standing Status:   Future    Standing Expiration Date:   08/05/2020     Tobe Sos Katerina Zurn, PA-C 08/06/19   ADDENDUM: Hematology/Oncology Attending: I had a face-to-face encounter with the patient today.  I recommended his care plan.  This is a very pleasant 24 years old African-American male with history of stage IIIa nodular sclerosing Hodgkin lymphoma with bulky lymphadenopathy in the right lower cervical, right supraclavicular, right axillary as well as mediastinal lymphadenopathy diagnosed in March 2017 status post 6 cycles of systemic chemotherapy with ABVD.  He also had some evidence for disease recurrence in the left superior paratracheal lymph node as well as soft tissue mass  and he was treated with curative radiotherapy.  The patient has been on observation and doing fine with no concerning complaints.  He continues to work full-time. He had repeat CT scan of the neck and the chest performed recently.  I personally and independently reviewed the scans and discussed the results with the patient today. His scan showed no concerning findings for disease recurrence or metastasis. I recommended for the patient to continue on observation with repeat CT scan of the neck and the chest in 1 year. He was advised to call immediately if he has any other concerning symptoms in the interval.  Disclaimer: This note was dictated with voice recognition software. Similar sounding words can inadvertently be transcribed and may be missed upon review. Eilleen Kempf, MD  08/06/19  

## 2019-08-06 ENCOUNTER — Other Ambulatory Visit: Payer: Self-pay

## 2019-08-06 ENCOUNTER — Inpatient Hospital Stay: Payer: Self-pay | Attending: Physician Assistant | Admitting: Physician Assistant

## 2019-08-06 ENCOUNTER — Encounter: Payer: Self-pay | Admitting: Physician Assistant

## 2019-08-06 VITALS — BP 137/83 | HR 73 | Temp 97.3°F | Resp 18 | Ht 70.0 in | Wt 213.2 lb

## 2019-08-06 DIAGNOSIS — C8112 Nodular sclerosis classical Hodgkin lymphoma, intrathoracic lymph nodes: Secondary | ICD-10-CM

## 2019-08-06 DIAGNOSIS — K219 Gastro-esophageal reflux disease without esophagitis: Secondary | ICD-10-CM | POA: Insufficient documentation

## 2019-08-06 DIAGNOSIS — Z8571 Personal history of Hodgkin lymphoma: Secondary | ICD-10-CM | POA: Insufficient documentation

## 2019-08-06 DIAGNOSIS — Z9221 Personal history of antineoplastic chemotherapy: Secondary | ICD-10-CM | POA: Insufficient documentation

## 2019-08-06 DIAGNOSIS — Z923 Personal history of irradiation: Secondary | ICD-10-CM | POA: Insufficient documentation

## 2019-08-08 ENCOUNTER — Telehealth: Payer: Self-pay | Admitting: Physician Assistant

## 2019-08-08 NOTE — Telephone Encounter (Signed)
Scheduled per los. Called and left msg. Mailed printout  °

## 2020-03-29 ENCOUNTER — Other Ambulatory Visit: Payer: Self-pay

## 2020-03-29 ENCOUNTER — Encounter (HOSPITAL_COMMUNITY): Payer: Self-pay | Admitting: Emergency Medicine

## 2020-03-29 ENCOUNTER — Ambulatory Visit (HOSPITAL_COMMUNITY)
Admission: EM | Admit: 2020-03-29 | Discharge: 2020-03-29 | Disposition: A | Discharge status: Home / Self Care | Payer: Medicaid Other | Attending: Emergency Medicine | Admitting: Emergency Medicine

## 2020-03-29 DIAGNOSIS — Z113 Encounter for screening for infections with a predominantly sexual mode of transmission: Secondary | ICD-10-CM | POA: Insufficient documentation

## 2020-03-29 NOTE — ED Triage Notes (Signed)
Pt here with penile discharge and wanting STD testing

## 2020-03-29 NOTE — ED Provider Notes (Signed)
[facility]    CSN: [id number] Arrival date & time: 03/29/20  1505      History   Chief Complaint Chief Complaint  Patient presents with  . Exposure to STD    HPI KAILEN HINKLE is a 25 y.o. male.   Patient presents with possible Emmah Bratcher thin discharge and urinary frequency. Denies itching, dysuria, urgency, rash, lesions, swelling of scrotum, hematuria. No known contacts.   Past Medical History:  Diagnosis Date  . Adult ADHD   . Anemia   . Chemotherapy induced neutropenia ([facility]) 06/04/2015  . Encounter for antineoplastic chemotherapy 05/04/2015  . GERD (gastroesophageal reflux disease)   . Headache   . Nodular sclerosing Hodgkin's lymphoma ([place])   . 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 Pneumonia     Patient Active Problem List   Diagnosis Date Noted  . Liver dysfunction 08/01/2017  . Port catheter in place 06/15/2016  . Pneumonitis 10/17/2015  . Chemotherapy induced neutropenia ([facility]) 06/04/2015  . 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 Encounter for antineoplastic chemotherapy 05/04/2015  . Pericardial effusion 03/29/2015  . 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 CAP (community acquired pneumonia)   . Cavitary lung disease   . Cavitary lesion of lung 01/19/2015  . Leukocytosis 01/19/2015  . Thrombocytosis 01/19/2015  . Anemia 01/19/2015    Past Surgical History:  Procedure Laterality Date  . CARDIAC CATHETERIZATION N/A 04/01/2015   Procedure: Pericardiocentesis;  Surgeon: Sherren Mocha, MD;  [facility];  Service: Cardiovascular;  Laterality: N/A;  . IR REMOVAL TUN ACCESS W/ PORT W/O FL MOD SED  08/22/2018  . SCALENE NODE BIOPSY Right 03/15/2015   Procedure: BIOPSY SCALENE NODE;  Surgeon: [name], MD;  Location: [facility];  Service: Thoracic;  Laterality: Right;  . SUBXYPHOID PERICARDIAL WINDOW  04/29/2015  . SUBXYPHOID PERICARDIAL  WINDOW N/A 04/29/2015   Procedure: SUBXYPHOID PERICARDIAL WINDOW;  Surgeon: [name], MD;  Location: [facility];  Service: Thoracic;  Laterality: N/A;  . SUPRACLAVICAL NODE BIOPSY Right 03/15/2015   Procedure: RIGHT SUPRACLAVICAL/CERVICAL LYMPH NODE BIOPSY;  Surgeon: [name], MD;  Location: [facility];  Service: Thoracic;  Laterality: Right;  . TEE WITHOUT CARDIOVERSION N/A 04/29/2015   Procedure: TRANSESOPHAGEAL ECHOCARDIOGRAM (TEE);  Surgeon: [name], MD;  Location: [facility];  Service: Thoracic;  Laterality: N/A;  . VIDEO BRONCHOSCOPY Bilateral 01/22/2015   Procedure: VIDEO BRONCHOSCOPY WITH FLUORO;  Surgeon: Marshell Garfinkel, MD;  Location: [facility];  Service: Cardiopulmonary;  Laterality: Bilateral;       Home Medications    Prior to Admission medications   Not on File    Family History Family History  Problem Relation Age of Onset  . Narcolepsy Mother   . Heart disease Maternal Grandfather   . Lung disease Maternal Grandfather        From smoking  . Cancer Neg Hx     Social History Social History   Tobacco Use  . Smoking status: Former Smoker    Quit date: 09/16/2015    Years since quitting: 4.5  . Smokeless tobacco: Never Used  . Tobacco comment: E cigarettes/Vaped.  Vaping Use  . Vaping Use: Former  Substance Use Topics  . Alcohol use: Yes    Comment: occasionally  . Drug use: Yes    Types: Marijuana     Allergies   Amoxicillin-pot clavulanate and Bactrim   Review of Systems Review of  Systems  Constitutional: Negative.   Respiratory: Negative.   Cardiovascular: Negative.   Gastrointestinal: Negative.   Genitourinary: Positive for frequency and penile discharge. Negative for decreased urine volume, difficulty urinating, dysuria, enuresis, flank pain, genital sores, hematuria, penile pain, penile swelling, scrotal swelling, testicular pain and urgency.  Skin: Negative.      Physical Exam Triage Vital Signs ED Triage Vitals  [03/29/20 1518]  Enc Vitals Group     BP 132/88     Pulse Rate 86     Resp 18     Temp 98.5 F (36.9 C)     Temp Source Oral     SpO2 98 %     Weight      Height      Head Circumference      Peak Flow      Pain Score 0     Pain Loc      Pain Edu?      Excl. in [place]?    No data found.  Updated Vital Signs BP 132/88 (BP Location: Left Arm)   Pulse 86   Temp 98.5 F (36.9 C) (Oral)   Resp 18   SpO2 98%   Visual Acuity Right Eye Distance:   Left Eye Distance:   Bilateral Distance:    Right Eye Near:   Left Eye Near:    Bilateral Near:     Physical Exam Constitutional:      Appearance: Normal appearance. He is normal weight.  HENT:     Head: Normocephalic.  Eyes:     Extraocular Movements: Extraocular movements intact.  Pulmonary:     Effort: Pulmonary effort is normal.  Musculoskeletal:        General: Normal range of motion.     Cervical back: Normal range of motion.  Skin:    General: Skin is warm and dry.  Neurological:     Mental Status: He is alert and oriented to person, place, and time. Mental status is at baseline.  Psychiatric:        Mood and Affect: Mood normal.        Behavior: Behavior normal.        Thought Content: Thought content normal.        Judgment: Judgment normal.      UC Treatments / Results  Labs (all labs ordered are listed, but only abnormal results are displayed) Labs Reviewed  CYTOLOGY, (ORAL, ANAL, URETHRAL) ANCILLARY ONLY    EKG   Radiology No results found.  Procedures Procedures (including critical care time)  Medications Ordered in UC Medications - No data to display  Initial Impression / Assessment and Plan / UC Course  I have reviewed the triage vital signs and the nursing notes.  Pertinent labs & imaging results that were available during my care of the patient were reviewed by me and considered in my medical decision making (see chart for details).  Routine screening for STI  1. Labs pending 2-3  days, will treat per protocol 2. Advised abstinence until labs result and/or treatment complete   Final Clinical Impressions(s) / UC Diagnoses   Final diagnoses:  Routine screening for STI (sexually transmitted infection)     Discharge Instructions     Lab results 2-3 days, will be called if positive for treatment  Refrain from sex until lab results, if positive refrain from sex until treatment complete, if positive notify all patients so that they may treated as well    ED Prescriptions  None     PDMP not reviewed this encounter.   Hans Eden, NP 03/29/20 1538

## 2020-03-29 NOTE — Discharge Instructions (Addendum)
Lab results 2-3 days, will be called if positive for treatment  Refrain from sex until lab results, if positive refrain from sex until treatment complete, if positive notify all patients so that they may treated as well

## 2020-03-30 ENCOUNTER — Telehealth (HOSPITAL_COMMUNITY): Payer: Self-pay | Admitting: Emergency Medicine

## 2020-03-30 LAB — CYTOLOGY, (ORAL, ANAL, URETHRAL) ANCILLARY ONLY
Chlamydia: POSITIVE — AB
Comment: NEGATIVE
Comment: NEGATIVE
Comment: NORMAL
Neisseria Gonorrhea: NEGATIVE
Trichomonas: NEGATIVE

## 2020-03-30 MED ORDER — DOXYCYCLINE HYCLATE 100 MG PO CAPS: 100.0000 mg | ORAL_CAPSULE | Freq: Two times a day (BID) | ORAL | 0 refills | Status: AC

## 2020-04-29 ENCOUNTER — Other Ambulatory Visit: Payer: Self-pay

## 2020-04-29 ENCOUNTER — Encounter (HOSPITAL_COMMUNITY): Payer: Self-pay | Admitting: Emergency Medicine

## 2020-04-29 ENCOUNTER — Ambulatory Visit (HOSPITAL_COMMUNITY)
Admission: EM | Admit: 2020-04-29 | Discharge: 2020-04-29 | Disposition: A | Discharge status: Home / Self Care | Payer: Medicaid Other | Attending: Emergency Medicine | Admitting: Emergency Medicine

## 2020-04-29 DIAGNOSIS — R369 Urethral discharge, unspecified: Secondary | ICD-10-CM | POA: Insufficient documentation

## 2020-04-29 DIAGNOSIS — Z113 Encounter for screening for infections with a predominantly sexual mode of transmission: Secondary | ICD-10-CM | POA: Insufficient documentation

## 2020-04-29 DIAGNOSIS — Z202 Contact with and (suspected) exposure to infections with a predominantly sexual mode of transmission: Secondary | ICD-10-CM | POA: Insufficient documentation

## 2020-04-29 MED ORDER — DOXYCYCLINE HYCLATE 100 MG PO CAPS: 100.0000 mg | ORAL_CAPSULE | Freq: Two times a day (BID) | ORAL | 0 refills | Status: AC

## 2020-04-29 NOTE — ED Triage Notes (Signed)
Pt here for STD check.... was here on 3/28 and was treated for Chlam  Sx today include white penile d/c and itching  A&O x4... NAD.Marland Kitchen. ambulatory

## 2020-04-29 NOTE — Discharge Instructions (Addendum)
I have sent off gonorrhea, chlamydia and trichomonas today.  You can wait to start the doxycycline.  Start it if your chlamydia test is positive. Your partners will both need to be treated appropriately if any of your labs come back abnormal.  Give [place] a working phone number so that we can contact you if needed. Refrain from sexual contact until you know your results and your partner(s) are treated if necessary. Return to the ER if you get worse, have a fever >100.4, or for any concerns.    Below is a list of primary care practices who are taking new patients for you to follow-up with.  [facility] internal medicine clinic Ground Floor - [facility], [place], [place], [place] [address] [phone]  [facility] Primary Care at [facility] [address] [place], [place] [address] [phone]  [place] [place], [place] [address] [phone]  [facility]/Internal Medicine [phone] [place] [address]  [facility]: [place]  [phone]  [facility]: [place]   [phone]  [facility] Family Medicine: [address] [place]  [phone]   primary care : [address] [place] [phone]  [facility] Primary Care: [address]  [place] [phone] [phone]  [facility]: [place] [phone]  Dr. Blanchie Serve [place] [address]  [phone]  Go to www.goodrx.com  or www.costplusdrugs.com to look up your medications. This will give you a list of where you can find your prescriptions at the most affordable  prices. Or ask the pharmacist what the cash price is, or if they have any other discount programs available to help make your medication more affordable. This can be less expensive than what you would pay with insurance.

## 2020-04-29 NOTE — ED Provider Notes (Signed)
HPI  SUBJECTIVE:  Jeffery Crane is a 25 y.o. male who presents with purulent penile discharge, penile itching along the shaft for the past week to week and a half.  He has no urinary complaints.  No erythema, swelling of the glans.  No penile rash, blisters.  No testicular, scrotal pain or swelling.  No abdominal, back, pelvic pain.  He has 2 male partners both of whom are asymptomatic.  He does not use condoms consistently with them.  He states one of his partners was recently treated for chlamydia, but he states that he does not think that she finished the medications.  He is unsure if his partners have other partners.  He denies scented soaps, spermicides, lubricants.  No aggravating or alleviating factors.  He has not tried anything for this.  He has a past medical history of gonorrhea, chlamydia, Hodgkin's lymphoma status post chemo 3-4 year ago.  No history of HIV, HSV, trichomonas, yeast, diabetes.  PMD: None.  Past Medical History:  Diagnosis Date  . Adult ADHD   . Anemia   . Chemotherapy induced neutropenia ([place]) 06/04/2015  . Encounter for antineoplastic chemotherapy 05/04/2015  . GERD (gastroesophageal reflux disease)   . Headache   . 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 Pneumonia     Past Surgical History:  Procedure Laterality Date  . CARDIAC CATHETERIZATION N/A 04/01/2015   Procedure: Pericardiocentesis;  Surgeon: Sherren Mocha, MD;  Location: [place] [facility];  Service: Cardiovascular;  Laterality: N/A;  . IR REMOVAL TUN ACCESS W/ PORT W/O FL MOD SED  08/22/2018  . SCALENE NODE BIOPSY Right 03/15/2015   Procedure: BIOPSY SCALENE NODE;  Surgeon: [name], MD;  Location: [place];  Service: Thoracic;  Laterality: Right;  . SUBXYPHOID PERICARDIAL WINDOW  04/29/2015  . SUBXYPHOID PERICARDIAL WINDOW N/A 04/29/2015   Procedure: SUBXYPHOID PERICARDIAL WINDOW;  Surgeon: [name], MD;  Location: [place];  Service: Thoracic;  Laterality: N/A;  . SUPRACLAVICAL NODE BIOPSY Right 03/15/2015   Procedure: RIGHT SUPRACLAVICAL/CERVICAL LYMPH NODE BIOPSY;  Surgeon: [name], MD;  Location: [place];  Service: Thoracic;  Laterality: Right;  . TEE WITHOUT CARDIOVERSION N/A 04/29/2015   Procedure: TRANSESOPHAGEAL ECHOCARDIOGRAM (TEE);  Surgeon: [name], MD;  Location: [place];  Service: Thoracic;  Laterality: N/A;  . VIDEO BRONCHOSCOPY Bilateral 01/22/2015   Procedure: VIDEO BRONCHOSCOPY WITH FLUORO;  Surgeon: Marshell Garfinkel, MD;  Location: [place];  Service: Cardiopulmonary;  Laterality: Bilateral;    Family History  Problem Relation Age of Onset  . Narcolepsy Mother   . Heart disease Maternal Grandfather   . Lung disease Maternal Grandfather        From smoking  . Cancer Neg Hx     Social History   Tobacco Use  . Smoking status: Former Smoker    Quit date: 09/16/2015    Years since quitting: 4.6  . Smokeless tobacco: Never Used  . Tobacco comment: E cigarettes/Vaped.  Vaping Use  . Vaping Use: Former  Substance Use Topics  . Alcohol use: Yes    Comment: occasionally  . Drug use: Yes    Types: Marijuana    No current facility-administered medications for this encounter.  Current Outpatient Medications:  .  doxycycline (VIBRAMYCIN) 100 MG capsule, Take 1 capsule (100 mg total) by mouth 2 (two) times daily for 7 days., Disp: 14 capsule, Rfl: 0  Facility-Administered Medications  Ordered in Other Encounters:  .  sodium chloride flush (NS) 0.9 % injection 10 mL, 10 mL, Intracatheter, PRN, Curt Bears, MD, 10 mL at 09/28/15 1654  Allergies   Augmentin hives and rash Bactrim unknown reaction- per pt's mom  ROS  As noted in HPI.   Physical Exam  Vitals:   04/29/20 1709  BP: 122/87  Pulse: 83  Resp: 14  Temp: 98.2 F (36.8 C)  SpO2: 98%   Constitutional: Well developed, well nourished, no acute distress Eyes:   EOMI, conjunctiva normal bilaterally HENT: Normocephalic, atraumatic,mucus membranes moist Respiratory: Normal inspiratory effort Cardiovascular: Normal rate GI: nondistended GU: Normal uncircumcised male, testes descended bilaterally.  Normal glans.  No discharge.  No vesicles or new rash along the shaft.  Scrotum normal.  Testes descended bilaterally, no epididymal or testicular tenderness, swelling.  Patient declined chaperone. Lymph: No inguinal lymphadenopathy skin: No rash, skin intact Musculoskeletal: no deformities Neurologic: Alert & oriented x 3, no focal neuro deficits Psychiatric: Speech and behavior appropriate   ED Course   Medications - No data to display  No orders of the defined types were placed in this encounter.   No results found for this or any previous visit (from the past 24 hour(s)). No results found.  ED Clinical Impression  1. Penile discharge   2. Possible exposure to STD   3. Screen for STD (sexually transmitted disease)      ED Assessment/Plan  Patient tested positive for chlamydia in June 21 and on 03/29/2020.   Discussed with patient that if we tested him for chlamydia today, it could be a false positive, however he is having new symptoms, so will retest.  We have decided to wait for gonorrhea results.  Advised that he and his partners will need to come back for injections if his labs come back positive for gonorrhea.  Will send home with a wait-and-see prescription doxycycline if chlamydia is positive.  He has MyChart. Advised abstinence until labs return, his partners are treated if necessary and his symptoms have resolved.  Providing primary care list for ongoing care and order assistance in finding a PMD.  Discussed  MDM, treatment plan, and plan for follow-up with patient.patient agrees with plan.   Meds ordered this encounter  Medications  . doxycycline (VIBRAMYCIN) 100 MG capsule    Sig: Take 1 capsule (100 mg total) by mouth 2 (two)  times daily for 7 days.    Dispense:  14 capsule    Refill:  0    *This clinic note was created using [employer]. Therefore, there may be occasional mistakes despite careful proofreading.  ?    Melynda Ripple, MD 04/30/20 [phone]

## 2020-04-30 LAB — CYTOLOGY, (ORAL, ANAL, URETHRAL) ANCILLARY ONLY
Chlamydia: NEGATIVE
Comment: NEGATIVE
Comment: NORMAL
Neisseria Gonorrhea: NEGATIVE

## 2020-05-10 ENCOUNTER — Encounter: Payer: Self-pay | Admitting: *Deleted

## 2020-07-02 ENCOUNTER — Encounter: Payer: Self-pay | Admitting: Internal Medicine

## 2020-08-06 ENCOUNTER — Other Ambulatory Visit: Payer: Self-pay

## 2020-08-06 ENCOUNTER — Other Ambulatory Visit: Payer: Self-pay | Admitting: Medical Oncology

## 2020-08-06 ENCOUNTER — Ambulatory Visit (HOSPITAL_COMMUNITY): Payer: Self-pay

## 2020-08-06 ENCOUNTER — Inpatient Hospital Stay: Payer: Medicaid Other | Attending: Internal Medicine

## 2020-08-06 DIAGNOSIS — C8112 Nodular sclerosis classical Hodgkin lymphoma, intrathoracic lymph nodes: Secondary | ICD-10-CM

## 2020-08-09 ENCOUNTER — Telehealth: Payer: Self-pay | Admitting: Medical Oncology

## 2020-08-09 ENCOUNTER — Inpatient Hospital Stay: Payer: Medicaid Other | Admitting: Internal Medicine
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
## 2022-08-30 ENCOUNTER — Inpatient Hospital Stay: Payer: Medicaid Other

## 2022-08-30 ENCOUNTER — Inpatient Hospital Stay: Payer: Medicaid Other | Admitting: Internal Medicine

## 2022-09-03 NOTE — Progress Notes (Deleted)
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 Cytology was negative for lymphoma. 2) status post subxiphoid pericardial window under the care of Dr. Dorris Fetch on 04/29/2015. 3) Systemic chemotherapy with ABVD. First dose 04/20/2015. Status post 6 cycles.  4) curative radiotherapy to the recurrent mediastinal adenopathy under the care of Dr. Kathrynn Running completed October 09, 2016.  CURRENT THERAPY: Observation   INTERVAL HISTORY: Jeffery Crane 27 y.o. male returns o the clinic today for a follow-up visit. He was previously followed for his history of nodular sclerosis Hodgkin Lymphoma. He was lost to follow up since 2021 He was referred to re-establish care.    He has been on observation since 2018. He denies any recent fevers, chills, night sweats, weight loss, or lymphadenopathy. He denies any chest pain, shortness of breath, cough, or hemoptysis. He denies any nausea, vomiting, diarrhea, constipation, abdominal pain, or early satiety. He denies any abnormal bleeding or bruising.   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  MEDICATIONS:  No current outpatient medications on file.   No current  facility-administered medications for this visit.   Facility-Administered Medications Ordered in Other Visits  Medication Dose Route Frequency Provider Last Rate Last Admin   sodium chloride flush (NS) 0.9 % injection 10 mL  10 mL Intracatheter PRN Si Gaul, MD   10 mL at 09/28/15 1654    SURGICAL HISTORY:  Past Surgical History:  Procedure Laterality Date   CARDIAC CATHETERIZATION N/A 04/01/2015   Procedure: Pericardiocentesis;  Surgeon: Tonny Bollman, MD;  [facility] INVAS[facility];  Service: Cardiovascular;  Laterality: N/A;   IR REMOVAL TUN ACCESS W/ PORT W/O FL MOD SED  08/22/2018   SCALENE NODE BIOPSY Right 03/15/2015   Procedure: BIOPSY SCALENE NODE;  Surgeon: Loreli Slot, MD;  Location: [facility] OR;  Service: Thoracic;  Laterality: Right;   SUBXYPHOID PERICARDIAL WINDOW  04/29/2015   SUBXYPHOID PERICARDIAL WINDOW N/A 04/29/2015   Procedure: SUBXYPHOID PERICARDIAL WINDOW;  Surgeon: Loreli Slot, MD;  Location: [facility] OR;  Service: Thoracic;  Laterality: N/A;   SUPRACLAVICAL NODE BIOPSY Right 03/15/2015   Procedure: RIGHT SUPRACLAVICAL/CERVICAL LYMPH NODE BIOPSY;  Surgeon: Loreli Slot, MD;  Location: [facility] OR;  Service: Thoracic;  Laterality: Right;   TEE WITHOUT CARDIOVERSION N/A 04/29/2015   Procedure: TRANSESOPHAGEAL ECHOCARDIOGRAM (TEE);  Surgeon: Loreli Slot, MD;  Location: [facility] OR;  Service: Thoracic;  Laterality: N/A;   VIDEO BRONCHOSCOPY Bilateral 01/22/2015   Procedure: VIDEO BRONCHOSCOPY WITH FLUORO;  Surgeon: Chilton Greathouse, MD;  Location: [facility];  Service: Cardiopulmonary;  Laterality: Bilateral;    REVIEW OF SYSTEMS:   Review of Systems  Constitutional: Negative for appetite change, chills, fatigue, fever and unexpected weight change.  HENT:   Negative for mouth sores, nosebleeds, sore throat  and trouble swallowing.   Eyes: Negative for eye problems and icterus.  Respiratory: Negative for cough, hemoptysis, shortness of breath and  wheezing.   Cardiovascular: Negative for chest pain and leg swelling.  Gastrointestinal: Negative for abdominal pain, constipation, diarrhea, nausea and vomiting.  Genitourinary: Negative for bladder incontinence, difficulty urinating, dysuria, frequency and hematuria.   Musculoskeletal: Negative for back pain, gait problem, neck pain and neck stiffness.  Skin: Negative for itching and rash.  Neurological: Negative for dizziness, extremity weakness, gait problem, headaches, light-headedness and seizures.  Hematological: Negative for adenopathy. Does not bruise/bleed easily.  Psychiatric/Behavioral: Negative for confusion, depression and sleep disturbance. The patient is not nervous/anxious.     PHYSICAL EXAMINATION:  There were no vitals taken for this visit.  ECOG PERFORMANCE STATUS: {CHL ONC ECOG [id number]  Physical Exam  Constitutional: Oriented to person, place, and time and well-developed, well-nourished, and in no distress. No distress.  HENT:  Head: Normocephalic and atraumatic.  Mouth/Throat: Oropharynx is clear and moist. No oropharyngeal exudate.  Eyes: Conjunctivae are normal. Right eye exhibits no discharge. Left eye exhibits no discharge. No scleral icterus.  Neck: Normal range of motion. Neck supple.  Cardiovascular: Normal rate, regular rhythm, normal heart sounds and intact distal pulses.   Pulmonary/Chest: Effort normal and breath sounds normal. No respiratory distress. No wheezes. No rales.  Abdominal: Soft. Bowel sounds are normal. Exhibits no distension and no mass. There is no tenderness.  Musculoskeletal: Normal range of motion. Exhibits no edema.  Lymphadenopathy:    No cervical adenopathy.  Neurological: Alert and oriented to person, place, and time. Exhibits normal muscle tone. Gait normal. Coordination normal.  Skin: Skin is warm and dry. No rash noted. Not diaphoretic. No erythema. No pallor.  Psychiatric: Mood, memory and judgment normal.  Vitals  reviewed.  LABORATORY DATA: Lab Results  Component Value Date   WBC 4.4 07/30/2019   HGB 15.5 07/30/2019   HCT 44.3 07/30/2019   MCV 87.2 07/30/2019   PLT 200 07/30/2019      Chemistry      Component Value Date/Time   NA 140 07/30/2019 1352   NA 143 06/15/2016 1124   K 4.0 07/30/2019 1352   K 4.1 06/15/2016 1124   CL 106 07/30/2019 1352   CL 105 12/18/2013 0848   CO2 27 07/30/2019 1352   CO2 26 06/15/2016 1124   BUN 9 07/30/2019 1352   BUN 8.9 06/15/2016 1124   CREATININE 0.95 07/30/2019 1352   CREATININE 0.9 06/15/2016 1124      Component Value Date/Time   CALCIUM 9.6 07/30/2019 1352   CALCIUM 9.4 06/15/2016 1124   ALKPHOS 64 07/30/2019 1352   ALKPHOS 106 06/15/2016 1124   AST 23 07/30/2019 1352   AST 18 06/15/2016 1124   ALT 14 07/30/2019 1352   ALT 17 06/15/2016 1124   BILITOT 0.7 07/30/2019 1352   BILITOT 0.63 06/15/2016 1124       RADIOGRAPHIC STUDIES:  No results found.   ASSESSMENT/PLAN:  This is a very pleasant 27 year old African-American male with stage IIIa nodular sclerosing Hodgkin's lymphoma with bulky lymphadenopathy in the right lower cervical, right supraclavicular, right axillary as well as mediastinal lymphadenopathy. He also presented with a cavitary lung mass and splenic involvement. He was diagnosed in March of 2017. He is status post 6 cycles of systemic chemotherapy with ABVD.   He was on observation for several months. He was non-compliant with his follow up visits. Imaging studies showed evidence of disease recurrence involving the left  superior paratracheal lymph nodes as well as a soft tissue mass. He then underwent curative radiotherapy under the care of Dr. Kathrynn Running which was completed in October of 2018.   He was lost to follow up again from 2021-2024.   He is here today (09/05/22) to re-establish care.   The patient was seen with Dr. Arbutus Ped. Labs were reviewed. Recommend ***  I will arrange for a restaging CT scan.   The  patient was advised to call immediately if she has any concerning symptoms in the interval. The patient voices understanding of current disease status and treatment options and is in agreement with the current care plan. All questions were answered. The patient knows to call the clinic with any problems, questions or concerns. We can certainly see the patient much sooner if necessary    No orders of the defined types were placed in this encounter.    I spent {CHL ONC TIME VISIT - ZOXWR:[id number]} counseling the patient face to face. The total time spent in the appointment was {CHL ONC TIME VISIT - BJYNW:[id number]}.  Krithik Mapel L Nghia Mcentee, PA-C 09/03/22

## 2022-09-05 ENCOUNTER — Other Ambulatory Visit: Payer: Self-pay | Admitting: Physician Assistant

## 2022-09-05 DIAGNOSIS — C8112 Nodular sclerosis classical Hodgkin lymphoma, intrathoracic lymph nodes: Secondary | ICD-10-CM

## 2022-09-06 ENCOUNTER — Inpatient Hospital Stay: Payer: Medicaid Other

## 2022-09-06 ENCOUNTER — Inpatient Hospital Stay: Payer: Medicaid Other | Attending: Internal Medicine | Admitting: Physician Assistant

## 2022-11-22 ENCOUNTER — Encounter: Payer: Self-pay | Admitting: Internal Medicine

## 2022-11-22 NOTE — Telephone Encounter (Signed)
Telephone call  

## 2023-01-30 ENCOUNTER — Ambulatory Visit (HOSPITAL_COMMUNITY)
Admission: EM | Admit: 2023-01-30 | Discharge: 2023-01-30 | Disposition: A | Discharge status: Home / Self Care | Payer: No Typology Code available for payment source | Attending: Emergency Medicine | Admitting: Emergency Medicine

## 2023-01-30 ENCOUNTER — Encounter: Payer: Self-pay | Admitting: Internal Medicine

## 2023-01-30 ENCOUNTER — Encounter (HOSPITAL_COMMUNITY): Payer: Self-pay

## 2023-01-30 DIAGNOSIS — R509 Fever, unspecified: Secondary | ICD-10-CM | POA: Diagnosis not present

## 2023-01-30 DIAGNOSIS — J101 Influenza due to other identified influenza virus with other respiratory manifestations: Secondary | ICD-10-CM | POA: Diagnosis not present

## 2023-01-30 DIAGNOSIS — R051 Acute cough: Secondary | ICD-10-CM | POA: Diagnosis not present

## 2023-01-30 LAB — POC COVID19/FLU A&B COMBO
Covid Antigen, POC: NEGATIVE
Influenza A Antigen, POC: POSITIVE — AB
Influenza B Antigen, POC: NEGATIVE

## 2023-01-30 MED ORDER — PROMETHAZINE-DM 6.25-15 MG/5ML PO SYRP: 5.0000 mL | ORAL_SOLUTION | Freq: Every evening | ORAL | 0 refills | Status: DC | PRN

## 2023-01-30 MED ORDER — IBUPROFEN 800 MG PO TABS
ORAL_TABLET | ORAL | Status: AC
  Filled 2023-01-30: qty 1

## 2023-01-30 MED ORDER — IBUPROFEN 800 MG PO TABS
800.0000 mg | ORAL_TABLET | Freq: Once | ORAL | Status: AC
  Administered 2023-01-30: 800 mg via ORAL

## 2023-01-30 MED ORDER — IBUPROFEN 600 MG PO TABS: 600.0000 mg | ORAL_TABLET | Freq: Once | ORAL | Status: DC

## 2023-01-30 MED ORDER — OSELTAMIVIR PHOSPHATE 75 MG PO CAPS: 75.0000 mg | ORAL_CAPSULE | Freq: Two times a day (BID) | ORAL | 0 refills | Status: DC

## 2023-01-30 NOTE — ED Provider Notes (Signed)
[facility]    CSN: [id number] Arrival date & time: 01/30/23  1206      History   Chief Complaint Chief Complaint  Patient presents with   Fever    HPI Jeffery Crane is a 28 y.o. male.   Patient presents with cough, congestion, body aches, fatigue, and fever that began yesterday. Denies abdominal pain, nausea, vomiting, and diarrhea. Reports taking DayQuil, NyQuil, and Mucinex with some relief.    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 ([facility]) 03/26/2015   Pneumonia     Patient Active Problem List   Diagnosis Date Noted   Liver dysfunction 08/01/2017   Port catheter in place 06/15/2016   Pneumonitis 10/17/2015   Chemotherapy induced neutropenia ([facility]) 06/04/2015   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   Encounter for antineoplastic chemotherapy 05/04/2015   Pericardial effusion 03/29/2015   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 ([facility]) 03/26/2015   CAP (community acquired pneumonia)    Cavitary lung disease    Cavitary lesion of lung 01/19/2015   Leukocytosis 01/19/2015   Thrombocytosis 01/19/2015   Anemia 01/19/2015    Past Surgical History:  Procedure Laterality Date   CARDIAC CATHETERIZATION N/A 04/01/2015   Procedure: Pericardiocentesis;  Surgeon: Tonny Bollman, MD;  Location: [facility] INVASIVE CV LAB;  Service: Cardiovascular;  Laterality: N/A;   IR REMOVAL TUN ACCESS W/ PORT W/O FL MOD SED  08/22/2018   SCALENE NODE BIOPSY Right 03/15/2015   Procedure: BIOPSY SCALENE NODE;   Surgeon: Loreli Slot, MD;  Location: [facility] OR;  Service: Thoracic;  Laterality: Right;   SUBXYPHOID PERICARDIAL WINDOW  04/29/2015   SUBXYPHOID PERICARDIAL WINDOW N/A 04/29/2015   Procedure: SUBXYPHOID PERICARDIAL WINDOW;  Surgeon: Loreli Slot, MD;  Location: [facility] OR;  Service: Thoracic;  Laterality: N/A;   SUPRACLAVICAL NODE BIOPSY Right 03/15/2015   Procedure: RIGHT SUPRACLAVICAL/CERVICAL LYMPH NODE BIOPSY;  Surgeon: Loreli Slot, MD;  Location: [facility] OR;  Service: Thoracic;  Laterality: Right;   TEE WITHOUT CARDIOVERSION N/A 04/29/2015   Procedure: TRANSESOPHAGEAL ECHOCARDIOGRAM (TEE);  Surgeon: Loreli Slot, MD;  Location: [facility] OR;  Service: Thoracic;  Laterality: N/A;   VIDEO BRONCHOSCOPY Bilateral 01/22/2015   Procedure: VIDEO BRONCHOSCOPY WITH FLUORO;  Surgeon: Chilton Greathouse, MD;  Location: [facility];  Service: Cardiopulmonary;  Laterality: Bilateral;       Home Medications    Prior to Admission medications   Medication Sig Start Date End Date Taking? Authorizing Provider  oseltamivir (TAMIFLU) 75 MG capsule Take 1 capsule (75 mg total) by mouth every 12 (twelve) hours. 01/30/23  Yes Wynonia Lawman A, NP  promethazine-dextromethorphan (PROMETHAZINE-DM) 6.25-15 MG/5ML syrup Take 5 mLs by mouth at bedtime as needed for cough. 01/30/23  Yes Letta Kocher, NP    Family History Family History  Problem Relation Age of Onset   Narcolepsy Mother    Heart disease Maternal Grandfather    Lung disease Maternal Grandfather        From smoking   Cancer Neg Hx     Social  History Social History   Tobacco Use   Smoking status: Former    Current packs/day: 0.00    Types: Cigarettes    Quit date: 09/16/2015    Years since quitting: 7.3   Smokeless tobacco: Never   Tobacco comments:    E cigarettes/Vaped.  Vaping Use   Vaping status: Former  Substance Use Topics   Alcohol use: Yes    Comment: occasionally   Drug use: Yes    Types: Marijuana      Allergies   Amoxicillin-pot clavulanate and Bactrim   Review of Systems Review of Systems  Constitutional:  Positive for chills, fatigue and fever.  HENT:  Positive for congestion and rhinorrhea.   Respiratory:  Positive for cough. Negative for chest tightness, shortness of breath and wheezing.   Cardiovascular:  Negative for chest pain.  Gastrointestinal:  Negative for abdominal pain, diarrhea, nausea and vomiting.  Musculoskeletal:  Positive for arthralgias.  Neurological:  Negative for dizziness, weakness and headaches.     Physical Exam Triage Vital Signs ED Triage Vitals  Encounter Vitals Group     BP 01/30/23 1349 (!) 155/109     Systolic BP Percentile --      Diastolic BP Percentile --      Pulse Rate 01/30/23 1349 (!) 116     Resp 01/30/23 1349 16     Temp 01/30/23 1349 (!) 102.8 F (39.3 C)     Temp Source 01/30/23 1349 Oral     SpO2 01/30/23 1349 97 %     Weight --      Height --      Head Circumference --      Peak Flow --      Pain Score 01/30/23 1350 0     Pain Loc --      Pain Education --      Exclude from Growth Chart --    No data found.  Updated Vital Signs BP (!) 155/109 (BP Location: Left Arm)   Pulse (!) 116   Temp (!) 102.8 F (39.3 C) (Oral)   Resp 16   SpO2 97%   Visual Acuity Right Eye Distance:   Left Eye Distance:   Bilateral Distance:    Right Eye Near:   Left Eye Near:    Bilateral Near:     Physical Exam Vitals and nursing note reviewed.  Constitutional:      General: He is awake. He is not in acute distress.    Appearance: Normal appearance. He is well-developed and well-groomed. He is not ill-appearing.  HENT:     Right Ear: Tympanic membrane, ear canal and external ear normal.     Left Ear: Tympanic membrane, ear canal and external ear normal.     Nose: Congestion and rhinorrhea present.     Mouth/Throat:     Mouth: Mucous membranes are moist.     Pharynx: Posterior oropharyngeal erythema and postnasal drip  present. No oropharyngeal exudate.     Tonsils: No tonsillar exudate.  Cardiovascular:     Rate and Rhythm: Normal rate and regular rhythm.  Pulmonary:     Effort: Pulmonary effort is normal.     Breath sounds: Normal breath sounds.  Musculoskeletal:        General: Normal range of motion.  Skin:    General: Skin is warm and dry.  Neurological:     Mental Status: He is alert.  Psychiatric:        Behavior: Behavior is cooperative.  UC Treatments / Results  Labs (all labs ordered are listed, but only abnormal results are displayed) Labs Reviewed  POC COVID19/FLU A&B COMBO - Abnormal; Notable for the following components:      Result Value   Influenza A Antigen, POC Positive (*)    All other components within normal limits    EKG   Radiology No results found.  Procedures Procedures (including critical care time)  Medications Ordered in UC Medications  ibuprofen (ADVIL) tablet 800 mg (800 mg Oral Given 01/30/23 1421)    Initial Impression / Assessment and Plan / UC Course  I have reviewed the triage vital signs and the nursing notes.  Pertinent labs & imaging results that were available during my care of the patient were reviewed by me and considered in my medical decision making (see chart for details).     Patient presented with cough, congestion, body aches, fatigue, and fever that began yesterday. Patient reports cough keeps him up at night.   Upon assessment congestion and rhinorrhea are present, mild erythema and postnasal drip noted to pharynx. Lungs clear bilaterally on auscultation. Temp was 102.8 upon arrival. Ibuprofen given. Tested positive for influenza A.   Prescribed Tamiflu for influenza A. Prescribed promethazine DM cough syrup for cough at night. Discussed over-the-counter medication for symptoms. Discussed return precautions.  Final Clinical Impressions(s) / UC Diagnoses   Final diagnoses:  Influenza A  Fever, unspecified fever cause   Acute cough     Discharge Instructions      Start taking Tamiflu twice daily for 5 days for the flu. I have prescribed promethazine cough syrup that you can take at night. This can make you drowsy so do not drive or work while taking. Also, do not take this with NyQuil. Alternate between Tylenol and Ibuprofen as needed for fever, headache, and body aches. You can continue to use DayQuil, NyQuil, and Mucinex for your symptoms. Do not take Tylenol with DayQuil and Nyquil as it does already have Tylenol included. Return here if symptoms persist or worsen.     ED Prescriptions     Medication Sig Dispense Auth. Provider   oseltamivir (TAMIFLU) 75 MG capsule Take 1 capsule (75 mg total) by mouth every 12 (twelve) hours. 10 capsule Wynonia Lawman A, NP   promethazine-dextromethorphan (PROMETHAZINE-DM) 6.25-15 MG/5ML syrup Take 5 mLs by mouth at bedtime as needed for cough. 118 mL Wynonia Lawman A, NP      PDMP not reviewed this encounter.   Wynonia Lawman A, NP 01/30/23 1440

## 2023-01-30 NOTE — Discharge Instructions (Addendum)
Start taking Tamiflu twice daily for 5 days for the flu. I have prescribed promethazine cough syrup that you can take at night. This can make you drowsy so do not drive or work while taking. Also, do not take this with NyQuil. Alternate between Tylenol and Ibuprofen as needed for fever, headache, and body aches. You can continue to use DayQuil, NyQuil, and Mucinex for your symptoms. Do not take Tylenol with DayQuil and Nyquil as it does already have Tylenol included. Return here if symptoms persist or worsen.

## 2023-01-30 NOTE — ED Triage Notes (Addendum)
Pt states fever,body aches and cough for the past 2 days. States he has been taking nyquil,dayquil and mucinex at  home. Last dayqil was at 1130.

## 2023-06-12 ENCOUNTER — Encounter: Payer: Self-pay | Admitting: Internal Medicine
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
## 2023-06-28 ENCOUNTER — Other Ambulatory Visit: Payer: Self-pay

## 2023-06-28 ENCOUNTER — Inpatient Hospital Stay: Payer: Self-pay | Attending: Physician Assistant

## 2023-06-28 ENCOUNTER — Inpatient Hospital Stay (HOSPITAL_BASED_OUTPATIENT_CLINIC_OR_DEPARTMENT_OTHER): Payer: Self-pay | Admitting: Physician Assistant

## 2023-06-28 VITALS — BP 143/101 | HR 80 | Temp 97.5°F | Resp 15 | Wt 224.9 lb

## 2023-06-28 DIAGNOSIS — Z8571 Personal history of Hodgkin lymphoma: Secondary | ICD-10-CM | POA: Insufficient documentation

## 2023-06-28 DIAGNOSIS — Z923 Personal history of irradiation: Secondary | ICD-10-CM | POA: Insufficient documentation

## 2023-06-28 DIAGNOSIS — C8112 Nodular sclerosis classical Hodgkin lymphoma, intrathoracic lymph nodes: Secondary | ICD-10-CM

## 2023-06-28 DIAGNOSIS — Z9221 Personal history of antineoplastic chemotherapy: Secondary | ICD-10-CM | POA: Diagnosis not present

## 2023-06-28 DIAGNOSIS — R030 Elevated blood-pressure reading, without diagnosis of hypertension: Secondary | ICD-10-CM | POA: Diagnosis not present

## 2023-06-28 DIAGNOSIS — R0981 Nasal congestion: Secondary | ICD-10-CM | POA: Insufficient documentation

## 2023-06-28 LAB — CBC WITH DIFFERENTIAL (CANCER CENTER ONLY)
Abs Immature Granulocytes: 0.01 10*3/uL (ref 0.00–0.07)
Basophils Absolute: 0.1 10*3/uL (ref 0.0–0.1)
Basophils Relative: 1 %
Eosinophils Absolute: 0.1 10*3/uL (ref 0.0–0.5)
Eosinophils Relative: 1 %
HCT: 41.9 % (ref 39.0–52.0)
Hemoglobin: 15.1 g/dL (ref 13.0–17.0)
Immature Granulocytes: 0 %
Lymphocytes Relative: 46 %
Lymphs Abs: 2.3 10*3/uL (ref 0.7–4.0)
MCH: 30.4 pg (ref 26.0–34.0)
MCHC: 36 g/dL (ref 30.0–36.0)
MCV: 84.3 fL (ref 80.0–100.0)
Monocytes Absolute: 0.4 10*3/uL (ref 0.1–1.0)
Monocytes Relative: 9 %
Neutro Abs: 2.1 10*3/uL (ref 1.7–7.7)
Neutrophils Relative %: 43 %
Platelet Count: 247 10*3/uL (ref 150–400)
RBC: 4.97 MIL/uL (ref 4.22–5.81)
RDW: 12.3 % (ref 11.5–15.5)
WBC Count: 5 10*3/uL (ref 4.0–10.5)
nRBC: 0 % (ref 0.0–0.2)

## 2023-06-28 LAB — CMP (CANCER CENTER ONLY)
ALT: 21 U/L (ref 0–44)
AST: 19 U/L (ref 15–41)
Albumin: 4.5 g/dL (ref 3.5–5.0)
Alkaline Phosphatase: 62 U/L (ref 38–126)
Anion gap: 4 — ABNORMAL LOW (ref 5–15)
BUN: 11 mg/dL (ref 6–20)
CO2: 29 mmol/L (ref 22–32)
Calcium: 9.5 mg/dL (ref 8.9–10.3)
Chloride: 107 mmol/L (ref 98–111)
Creatinine: 0.94 mg/dL (ref 0.61–1.24)
GFR, Estimated: >60 mL/min (ref >60)
Glucose, Bld: 103 mg/dL — ABNORMAL HIGH (ref 70–99)
Potassium: 4 mmol/L (ref 3.5–5.1)
Sodium: 140 mmol/L (ref 135–145)
Total Bilirubin: 0.6 mg/dL (ref 0.0–1.2)
Total Protein: 7.5 g/dL (ref 6.5–8.1)

## 2023-06-28 LAB — LACTATE DEHYDROGENASE: LDH: 138 U/L (ref 98–192)

## 2023-06-29 ENCOUNTER — Encounter: Payer: Self-pay | Admitting: Internal Medicine

## 2023-06-29 ENCOUNTER — Telehealth: Payer: Self-pay | Admitting: Licensed Clinical Social Worker

## 2023-06-29 NOTE — Telephone Encounter (Signed)
 Voicemail left for pt regarding local food pantries. Information also sent to pt via email.

## 2023-07-12 ENCOUNTER — Ambulatory Visit (HOSPITAL_COMMUNITY): Payer: Self-pay

## 2023-07-23 ENCOUNTER — Ambulatory Visit (HOSPITAL_COMMUNITY)
Admission: RE | Admit: 2023-07-23 | Discharge: 2023-07-23 | Disposition: A | Discharge status: Home / Self Care | Payer: Self-pay | Source: Ambulatory Visit | Attending: Physician Assistant | Admitting: Physician Assistant

## 2023-07-23 ENCOUNTER — Encounter (HOSPITAL_COMMUNITY): Payer: Self-pay

## 2023-07-23 DIAGNOSIS — C8112 Nodular sclerosis classical Hodgkin lymphoma, intrathoracic lymph nodes: Secondary | ICD-10-CM | POA: Insufficient documentation

## 2023-07-23 MED ORDER — IOHEXOL 300 MG/ML  SOLN
75.0000 mL | Freq: Once | INTRAMUSCULAR | Status: AC | PRN
  Administered 2023-07-23: 75 mL via INTRAVENOUS

## 2023-07-25 ENCOUNTER — Other Ambulatory Visit: Payer: Self-pay | Admitting: Physician Assistant

## 2023-07-25 DIAGNOSIS — C8112 Nodular sclerosis classical Hodgkin lymphoma, intrathoracic lymph nodes: Secondary | ICD-10-CM

## 2023-07-26 ENCOUNTER — Telehealth: Payer: Self-pay

## 2023-07-26 NOTE — Telephone Encounter (Signed)
 Attempted to contact patient this morning regarding scheduling a follow-up visit and scan in 6 months per previous CT scan results. Patient does not have voicemail set up, so a message could not be left. Will attempt to call again later today. If unable to reach the patient, I will follow up by contacting the patient's grandmother.

## 2023-07-27 ENCOUNTER — Telehealth: Payer: Self-pay

## 2023-07-27 ENCOUNTER — Encounter: Payer: Self-pay | Admitting: Internal Medicine

## 2023-07-27 NOTE — Telephone Encounter (Signed)
 Spoke with patient regarding follow-up visit and scan scheduled in 6 months, Per Dr. Sherrod. Patient asked, Is it because the nodule grew? Informed patient that the follow-up is to monitor the nodule. Patient voiced understanding.

## 2023-08-03 ENCOUNTER — Telehealth: Payer: Self-pay | Admitting: Internal Medicine

## 2023-08-03 NOTE — Telephone Encounter (Signed)
Scheduled appointments with the patient

## 2023-08-08 ENCOUNTER — Ambulatory Visit: Payer: Self-pay

## 2023-08-08 ENCOUNTER — Encounter: Payer: Self-pay | Admitting: Internal Medicine

## 2023-08-08 ENCOUNTER — Other Ambulatory Visit: Payer: Self-pay

## 2023-08-08 VITALS — BP 136/98 | HR 82 | Temp 97.8°F | Ht 70.0 in | Wt 225.4 lb

## 2023-08-08 DIAGNOSIS — L219 Seborrheic dermatitis, unspecified: Secondary | ICD-10-CM

## 2023-08-08 DIAGNOSIS — I10 Essential (primary) hypertension: Secondary | ICD-10-CM

## 2023-08-08 DIAGNOSIS — L649 Androgenic alopecia, unspecified: Secondary | ICD-10-CM

## 2023-08-08 DIAGNOSIS — G8929 Other chronic pain: Secondary | ICD-10-CM

## 2023-08-08 DIAGNOSIS — Z8571 Personal history of Hodgkin lymphoma: Secondary | ICD-10-CM

## 2023-08-08 DIAGNOSIS — R0981 Nasal congestion: Secondary | ICD-10-CM

## 2023-08-08 DIAGNOSIS — M79641 Pain in right hand: Secondary | ICD-10-CM

## 2023-08-08 MED ORDER — AMLODIPINE BESYLATE 5 MG PO TABS
5.0000 mg | ORAL_TABLET | Freq: Every day | ORAL | 11 refills | Status: AC
  Filled 2023-08-08: qty 30, 30d supply, fill #0

## 2023-08-08 MED ORDER — KETOCONAZOLE 2 % EX SHAM
1.0000 | MEDICATED_SHAMPOO | CUTANEOUS | 0 refills | Status: AC
  Filled 2023-08-08: qty 120, 84d supply, fill #0

## 2023-08-08 NOTE — Assessment & Plan Note (Signed)
 Patient complained about dry scalp with itchiness.  Likely due to seborrheic dermatitis.  As patient to use ketoconazole .  Could also continue with head and shoulders. --Start using ketoconazole  2% shampoo

## 2023-08-08 NOTE — Assessment & Plan Note (Signed)
 Patient mention having nasal congestion for the past 2 years which she takes Claritin-D.  Patient says Claritin-D works for him.  Thanks nasal congestion might be because of his dog. --Continue taking Claritin as needed

## 2023-08-08 NOTE — Assessment & Plan Note (Signed)
 Patient recently establish care with Dr. Gatha on 06/28/2023.  Oncology is following him for any disease progression.  Recent CT scan of the chest showed 7 mm nodule in the RUL which was previously 5 mm.  Will repeat scan in 6 months.  It is not on any medications and is on observation. --Get CT neck and chest in 6 months to monitor right lung nodule and for any signs of adenopathy in or disease progression.

## 2023-08-08 NOTE — Assessment & Plan Note (Signed)
 Patient currently takes minoxidil for hair regrowth on his scalp.  Will confirm with dosing and frequency during next visit.

## 2023-08-08 NOTE — Progress Notes (Unsigned)
 CC: Establishing care  HPI:  Jeffery Crane is a 28 y.o. male living with a past medical history of Stage IIIa nodular schlerosing hodkins lymphoma who came in to establish care with us  today.  Patient completed treatment for his lymphoma in 2018 and has been on observation since then.  He had lost follow-up with care since 2021.  He recently had an appointment with Dr. Gatha on 06/28/2023.  Patient is currently employed as a [employer] architect.  He denies any fevers, chills, night sweats, shortness of breath, chest pain, headaches, blurry vision.  He does complain about dry and scaly scalp for the past 2-1/2 years for which he has tried using tea tree oil and shampoo whose name he does not remember.  Patient says his scalp is often itchy.  He also has congestion for some time and is managing that with Claritin-D.  He also uses minoxidil for hair loss.  Patient was concerned about his elevated blood pressure readings for the past few times recently.  He also mentioned about right distal arm swelling that has occurred for the past 8 to 9 years after he punched a wall.  Patient said it gives him about 6 out of 10 pain now and then.  He feels it when he is about to grip on to things.  He denies using Tylenol , ibuprofen , warm compress for this.  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 10 capsule 0   promethazine -dextromethorphan  (PROMETHAZINE -DM) 6.25-15 MG/5ML syrup Take 5 mLs by mouth at bedtime as needed for cough. 118 mL 0   Current Facility-Administered Medications on File Prior to Visit  Medication Dose Route Frequency Provider Last Rate Last Admin   sodium chloride  flush (NS) 0.9 % injection 10 mL  10 mL Intracatheter PRN Sherrod Sherrod, MD   10 mL at 09/28/15 1654    Family History  Problem Relation Age of Onset   Narcolepsy Mother    Heart disease Maternal Grandfather    Lung disease Maternal Grandfather        From smoking   Cancer Neg Hx     Social History   Socioeconomic History   Marital status: Single    Spouse name: Not on file   Number of children: Not on file   Years of education: Not on file   Highest education level: Not on file  Occupational History   Not on file  Tobacco Use   Smoking status: Former    Current packs/day: 0.00    Types: Cigarettes    Quit date: 09/16/2015    Years since quitting: 7.8   Smokeless tobacco: Never   Tobacco comments:    E cigarettes/Vaped.  Vaping Use   Vaping status: Former  Substance and Sexual Activity   Alcohol use: Yes    Comment: occasionally   Drug use: Yes    Types: Marijuana   Sexual activity:  Yes  Other Topics Concern   Not on file  Social History Narrative   Not on file   Social Drivers of Health   Financial Resource Strain: Not on file  Food Insecurity: Food Insecurity Present (06/28/2023)   Hunger Vital Sign    Worried About Running Out of Food in the Last Year: Sometimes true    Ran Out of Food in the Last Year: Sometimes true  Transportation Needs: No Transportation Needs (06/28/2023)   PRAPARE - Administrator, Civil Service (Medical): No    Lack of Transportation (Non-Medical): No  Physical Activity: Not on file  Stress: Not on file  Social Connections: Unknown (05/16/2021)   Received from [facility]   Social Network    Social Network: Not on file  Intimate Partner Violence: Not At Risk (06/28/2023)    Humiliation, Afraid, Rape, and Kick questionnaire    Fear of Current or Ex-Partner: No    Emotionally Abused: No    Physically Abused: No    Sexually Abused: No    Review of Systems: ROS  All pertinent ROS in HPI and plan Vitals:   08/08/23 1059  BP: (!) 153/94  Pulse: 87  Temp: 97.8 F (36.6 C)  TempSrc: Oral  SpO2: 97%  Weight: 225 lb 6.4 oz (102.2 kg)  Height: 5' 10 (1.778 m)    Physical Exam: Physical Exam HENT:     Head: Normocephalic.     Comments: dandruff and white flakiness throughout the scalp.  Scalp appeared very dry. Cardiovascular:     Rate and Rhythm: Normal rate and regular rhythm.     Comments: +2 radial pulses bilaterally Pulmonary:     Effort: Pulmonary effort is normal.     Breath sounds: Normal breath sounds.  Musculoskeletal:     Comments: Right upper extremity: No swelling noted in the ventral and dorsal region of the palm.  Normal adduction and abduction of fingers.  Normal range of motion of the wrist.  Negative Tinel's test.  Negative Phalen's test.  Neurological:     Mental Status: He is alert.      Assessment & Plan:     Patient seen with Dr. CHARLENA Eastern  Assessment & Plan History of Hodgkin lymphoma Patient recently establish care with Dr. Gatha on 06/28/2023.  Oncology is following him for any disease progression.  Recent CT scan of the chest showed 7 mm nodule in the RUL which was previously 5 mm.  Will repeat scan in 6 months.  It is not on any medications and is on observation. --Get CT neck and chest in 6 months to monitor right lung nodule and for any signs of adenopathy in or disease progression.  Nose congestion Patient mention having nasal congestion for the past 2 years which she takes Claritin-D.  Patient says Claritin-D works for him.  Thanks nasal congestion might be because of his dog. --Continue taking Claritin as needed  Male pattern alopecia Patient currently takes minoxidil for hair regrowth on his scalp.   Will confirm with dosing and frequency during next visit. Essential hypertension Patient blood pressure has been elevated during the last 3 times that it has been checked.  Today it was 136/98.  Likely not whitecoat hypertension as blood pressure has been elevated for 3 readings.  Discussed with the patient if he would like to manage with dietary changes or changes in physical activity versus if he would like to try low-dose of amlodipine .  Patient agreed to start taking  amlodipine .  Patient to check his blood pressure daily and keep a log. --Start amlodipine  5 mg daily --Keep a log of blood pressure readings at home --Follow-up in 1 month  Seborrheic dermatitis of scalp Patient complained about dry scalp with itchiness.  Likely due to seborrheic dermatitis.  As patient to use ketoconazole .  Could also continue with head and shoulders. --Start using ketoconazole  2% shampoo Chronic hand pain, right Patient complains about chronic right hip pain that has been going on for the past 8 to 9 years.  Asked him to try taking Tylenol  and ibuprofen  for pain control or even warm compress. --Take Tylenol  or ibuprofen  as needed for pain control   No orders of the defined types were placed in this encounter.    Rebecka Pion, D.O. [facility] Internal Medicine, PGY-1 Date 08/08/2023 Time 11:02 AM

## 2023-08-08 NOTE — Assessment & Plan Note (Signed)
 Patient blood pressure has been elevated during the last 3 times that it has been checked.  Today it was 136/98.  Likely not whitecoat hypertension as blood pressure has been elevated for 3 readings.  Discussed with the patient if he would like to manage with dietary changes or changes in physical activity versus if he would like to try low-dose of amlodipine .  Patient agreed to start taking amlodipine .  Patient to check his blood pressure daily and keep a log. --Start amlodipine  5 mg daily --Keep a log of blood pressure readings at home --Follow-up in 1 month

## 2023-08-08 NOTE — Patient Instructions (Signed)
 You came to clinic to establish care.  Please follow the directions as discussed in today's plan: --Start taking amlodipine  5 mg daily --Return in 1 month for blood pressure recheck --Start using ketoconazole  for flaky scalp

## 2023-08-08 NOTE — Assessment & Plan Note (Signed)
 Patient complains about chronic right hip pain that has been going on for the past 8 to 9 years.  Asked him to try taking Tylenol  and ibuprofen  for pain control or even warm compress. --Take Tylenol  or ibuprofen  as needed for pain control

## 2023-08-28 NOTE — Progress Notes (Signed)
 Internal Medicine Clinic Attending  Case discussed with the resident at the time of the visit.  We reviewed the resident's history and exam and pertinent patient test results.  I agree with the assessment, diagnosis, and plan of care documented in the resident's note.

## 2023-12-10 ENCOUNTER — Other Ambulatory Visit: Payer: Self-pay | Admitting: Physician Assistant

## 2024-01-23 ENCOUNTER — Other Ambulatory Visit: Payer: Self-pay

## 2024-01-23 DIAGNOSIS — C8112 Nodular sclerosis classical Hodgkin lymphoma, intrathoracic lymph nodes: Secondary | ICD-10-CM

## 2024-01-24 ENCOUNTER — Inpatient Hospital Stay: Payer: Self-pay | Attending: Internal Medicine

## 2024-01-29 ENCOUNTER — Encounter (HOSPITAL_COMMUNITY): Payer: Self-pay

## 2024-01-29 ENCOUNTER — Ambulatory Visit (HOSPITAL_COMMUNITY)
Admission: EM | Admit: 2024-01-29 | Discharge: 2024-01-29 | Disposition: A | Discharge status: Home / Self Care | Payer: Self-pay | Attending: Family Medicine | Admitting: Family Medicine

## 2024-01-29 DIAGNOSIS — K529 Noninfective gastroenteritis and colitis, unspecified: Secondary | ICD-10-CM

## 2024-01-29 MED ORDER — ONDANSETRON 4 MG PO TBDP: 4.0000 mg | ORAL_TABLET | Freq: Three times a day (TID) | ORAL | 0 refills | Status: AC | PRN

## 2024-01-29 MED ORDER — ONDANSETRON 4 MG PO TBDP
4.0000 mg | ORAL_TABLET | Freq: Once | ORAL | Status: AC
  Administered 2024-01-29: 4 mg via ORAL

## 2024-01-29 MED ORDER — ONDANSETRON 4 MG PO TBDP
ORAL_TABLET | ORAL | Status: AC
  Filled 2024-01-29: qty 1

## 2024-01-29 NOTE — Discharge Instructions (Addendum)
 Use the nausea medicine every 8 hours to help with nausea and vomiting Ensure you are staying well-hydrated with at least 64 ounces of water daily I would start slowly with sips of water, broth, ginger ale and Pedialyte If you are tolerating liquids today then transition to bland diet of solids tomorrow Benjamine diet usually consists of bananas, rice, toast, applesauce, boiled chicken and avoiding fried foods and spicy foods  Food poisoning typically last 1 to 2 days.  If you are unable to hold down food or fluids or develop any new concerning symptoms return to clinic or seek immediate care

## 2024-01-29 NOTE — ED Triage Notes (Signed)
 Pt present with c/o vomiting and diarrhea x 0800 today. Pt states he has not been able to stop. Pt states the last episode was around 1 hr ago.  Home interventions: none Pt tried to drink ginger ale and states he cannot keep that down.

## 2024-01-29 NOTE — ED Provider Notes (Signed)
 " [facility]    CSN: [id number] Arrival date & time: 01/29/24  1431      History   Chief Complaint Chief Complaint  Patient presents with   Vomiting    HPI Jeffery Crane is a 29 y.o. male.   Patient presents to clinic over concern of nausea, vomiting, diarrhea and generalized abdominal pain that started abruptly around 8 AM today.  Last night he did go out to eat with his son and he had a chicken [place].  Send had chicken tenders and is not sick with similar symptoms.  Patient has not had fever.  Reports his diarrhea is like water.  He threw up the chicken [place] and now is vomit is yellow.  Currently having abdominal pain.  Has tried ginger ale and was not able to hold this down. Denies previous abdominal surgeries  The history is provided by the patient and medical records.    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   Pneumonia    Pneumonitis 10/17/2015    Patient Active Problem List   Diagnosis Date Noted   History of Hodgkin lymphoma 08/08/2023   Nose congestion 08/08/2023   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   Essential hypertension 08/08/2023   Seborrheic dermatitis of scalp 08/08/2023   Chronic hand pain, right 08/08/2023   Port catheter in place 06/15/2016   Chemotherapy induced neutropenia 06/04/2015    Past Surgical History:  Procedure Laterality Date   CARDIAC CATHETERIZATION N/A 04/01/2015   Procedure: Pericardiocentesis;  Surgeon: Ozell Fell, MD;  Location: [facility] INVASIVE CV LAB;  Service: Cardiovascular;  Laterality: N/A;   IR  REMOVAL TUN ACCESS W/ PORT W/O FL MOD SED  08/22/2018   SCALENE NODE BIOPSY Right 03/15/2015   Procedure: BIOPSY SCALENE NODE;  Surgeon: Elspeth JAYSON Millers, MD;  Location: [facility] OR;  Service: Thoracic;  Laterality: Right;   SUBXYPHOID PERICARDIAL WINDOW  04/29/2015   SUBXYPHOID PERICARDIAL WINDOW N/A 04/29/2015   Procedure: SUBXYPHOID PERICARDIAL WINDOW;  Surgeon: Elspeth JAYSON Millers, MD;  Location: [facility] OR;  Service: Thoracic;  Laterality: N/A;   SUPRACLAVICAL NODE BIOPSY Right 03/15/2015   Procedure: RIGHT SUPRACLAVICAL/CERVICAL LYMPH NODE BIOPSY;  Surgeon: Elspeth JAYSON Millers, MD;  Location: [facility] OR;  Service: Thoracic;  Laterality: Right;   TEE WITHOUT CARDIOVERSION N/A 04/29/2015   Procedure: TRANSESOPHAGEAL ECHOCARDIOGRAM (TEE);  Surgeon: Elspeth JAYSON Millers, MD;  Location: [facility] OR;  Service: Thoracic;  Laterality: N/A;   VIDEO BRONCHOSCOPY Bilateral 01/22/2015   Procedure: VIDEO BRONCHOSCOPY WITH FLUORO;  Surgeon: Lonna Coder, MD;  Location: [facility];  Service: Cardiopulmonary;  Laterality: Bilateral;       Home Medications    Prior to Admission medications  Medication Sig Start Date End Date Taking? Authorizing Provider  ondansetron  (ZOFRAN -ODT) 4 MG disintegrating tablet Take 1 tablet (4 mg total) by mouth every 8 (eight) hours as needed for nausea or vomiting. 01/29/24  Yes Ball, Jaye Saal  G, FNP  acetaminophen  (TYLENOL ) 500 MG tablet Take 500 mg by mouth every 6 (six) hours as needed for mild pain (pain score 1-3).    [provider]  amLODipine  (NORVASC ) 5 MG tablet Take 1 tablet (5 mg total) by mouth daily. 08/08/23 08/07/24  Syeda, Raeeha, DO  ibuprofen  (ADVIL ) 200 MG tablet Take 200 mg by mouth every 6 (six) hours as needed for mild pain (pain score 1-3).    [provider]  ketoconazole  (NIZORAL ) 2 % shampoo Apply 1 Application topically 2 (two) times a week. 08/09/23   Syeda, Raeeha, DO  loratadine (CLARITIN) 10 MG tablet Take 10 mg by mouth daily as needed for  allergies.    [provider]    Family History Family History  Problem Relation Age of Onset   Narcolepsy Mother    Heart disease Maternal Grandfather    Lung disease Maternal Grandfather        From smoking   Cancer Neg Hx     Social History Social History[1]   Allergies   Amoxicillin -pot clavulanate and Bactrim   Review of Systems Review of Systems  Per HPI  Physical Exam Triage Vital Signs ED Triage Vitals  Encounter Vitals Group     BP 01/29/24 1600 107/73     Girls Systolic BP Percentile --      Girls Diastolic BP Percentile --      Boys Systolic BP Percentile --      Boys Diastolic BP Percentile --      Pulse Rate 01/29/24 1600 (!) 119     Resp 01/29/24 1600 20     Temp 01/29/24 1600 98 F (36.7 C)     Temp Source 01/29/24 1600 Oral     SpO2 01/29/24 1600 100 %     Weight --      Height --      Head Circumference --      Peak Flow --      Pain Score 01/29/24 1558 7     Pain Loc --      Pain Education --      Exclude from Growth Chart --    No data found.  Updated Vital Signs BP 107/73   Pulse (!) 119   Temp 98 F (36.7 C) (Oral)   Resp 20   SpO2 100%   Visual Acuity Right Eye Distance:   Left Eye Distance:   Bilateral Distance:    Right Eye Near:   Left Eye Near:    Bilateral Near:     Physical Exam Vitals and nursing note reviewed.  Constitutional:      Appearance: Normal appearance.  HENT:     Head: Normocephalic and atraumatic.     Right Ear: External ear normal.     Left Ear: External ear normal.     Nose: Nose normal.     Mouth/Throat:     Mouth: Mucous membranes are moist.  Eyes:     Conjunctiva/sclera: Conjunctivae normal.  Cardiovascular:     Rate and Rhythm: Normal rate.  Pulmonary:     Effort: Pulmonary effort is normal. No respiratory distress.  Abdominal:     General: Abdomen is flat. Bowel sounds are normal.     Palpations: Abdomen is soft.     Tenderness: There is abdominal tenderness. There is no  guarding or rebound.  Skin:    General: Skin is warm and dry.  Neurological:     General: No focal deficit present.     Mental Status: He is alert.  Psychiatric:        Mood and Affect: Mood normal.      UC Treatments /  Results  Labs (all labs ordered are listed, but only abnormal results are displayed) Labs Reviewed - No data to display  EKG   Radiology No results found.  Procedures Procedures (including critical care time)  Medications Ordered in UC Medications  ondansetron  (ZOFRAN -ODT) disintegrating tablet 4 mg (4 mg Oral Given 01/29/24 1600)    Initial Impression / Assessment and Plan / UC Course  I have reviewed the triage vital signs and the nursing notes.  Pertinent labs & imaging results that were available during my care of the patient were reviewed by me and considered in my medical decision making (see chart for details).  Vitals and triage reviewed, patient is hemodynamically stable.  Abdomen soft with active bowel sounds.  Endorses diffuse tenderness, without rebound or guarding.  Without evidence of acute abdomen.  Tolerating water after ODT Zofran .  Symptoms consistent with gastroenteritis.  Symptomatic management discussed.  Clear liquids with gradual diet progression discussed.  Plan of care, follow-up care, and return precautions given, no questions at this time.  Work note provided.    Final Clinical Impressions(s) / UC Diagnoses   Final diagnoses:  Gastroenteritis     Discharge Instructions      Use the nausea medicine every 8 hours to help with nausea and vomiting Ensure you are staying well-hydrated with at least 64 ounces of water daily I would start slowly with sips of water, broth, ginger ale and Pedialyte If you are tolerating liquids today then transition to bland diet of solids tomorrow Benjamine diet usually consists of bananas, rice, toast, applesauce, boiled chicken and avoiding fried foods and spicy foods  Food poisoning  typically last 1 to 2 days.  If you are unable to hold down food or fluids or develop any new concerning symptoms return to clinic or seek immediate care      ED Prescriptions     Medication Sig Dispense Auth. Provider   ondansetron  (ZOFRAN -ODT) 4 MG disintegrating tablet Take 1 tablet (4 mg total) by mouth every 8 (eight) hours as needed for nausea or vomiting. 20 tablet Ball, Lorrie Strauch  G, FNP      PDMP not reviewed this encounter.     [1]  Social History Tobacco Use   Smoking status: Former    Current packs/day: 0.00    Types: Cigarettes    Quit date: 09/16/2015    Years since quitting: 8.3   Smokeless tobacco: Never   Tobacco comments:    E cigarettes/Vaped.  Vaping Use   Vaping status: Former  Substance Use Topics   Alcohol use: Yes    Comment: occasionally   Drug use: Yes    Types: Marijuana     Ball, Laquenta Whitsell  G, FNP 01/29/24 1640  "

## 2024-01-31 ENCOUNTER — Inpatient Hospital Stay: Payer: Self-pay | Admitting: Internal Medicine

## 2024-02-15 ENCOUNTER — Ambulatory Visit (HOSPITAL_COMMUNITY): Payer: Self-pay

## 2024-02-25 ENCOUNTER — Inpatient Hospital Stay: Payer: Self-pay | Admitting: Internal Medicine

## 2024-02-25 ENCOUNTER — Inpatient Hospital Stay: Payer: Self-pay
